# Patient Record
Sex: Male | Born: 1945
Health system: Southern US, Community
[De-identification: ages and names within clinical notes are randomized; demographics above are authoritative.]

## PROBLEM LIST (undated history)

## (undated) DIAGNOSIS — M1990 Unspecified osteoarthritis, unspecified site: Secondary | ICD-10-CM

## (undated) DIAGNOSIS — K219 Gastro-esophageal reflux disease without esophagitis: Secondary | ICD-10-CM

## (undated) DIAGNOSIS — G4730 Sleep apnea, unspecified: Secondary | ICD-10-CM

## (undated) DIAGNOSIS — Z8719 Personal history of other diseases of the digestive system: Secondary | ICD-10-CM

## (undated) DIAGNOSIS — R51 Headache: Secondary | ICD-10-CM

## (undated) DIAGNOSIS — E7800 Pure hypercholesterolemia, unspecified: Secondary | ICD-10-CM

## (undated) DIAGNOSIS — G709 Myoneural disorder, unspecified: Secondary | ICD-10-CM

## (undated) DIAGNOSIS — I10 Essential (primary) hypertension: Secondary | ICD-10-CM

## (undated) DIAGNOSIS — C801 Malignant (primary) neoplasm, unspecified: Secondary | ICD-10-CM

## (undated) HISTORY — PX: COLONOSCOPY W/ BIOPSIES AND POLYPECTOMY: SHX1376

---

## 1998-12-24 ENCOUNTER — Emergency Department (HOSPITAL_COMMUNITY): Admission: EM | Admit: 1998-12-24 | Discharge: 1998-12-24 | Payer: Self-pay | Admitting: Emergency Medicine

## 2003-06-25 ENCOUNTER — Emergency Department (HOSPITAL_COMMUNITY): Admission: EM | Admit: 2003-06-25 | Discharge: 2003-06-25 | Payer: Self-pay | Admitting: Emergency Medicine

## 2008-08-14 ENCOUNTER — Emergency Department (HOSPITAL_COMMUNITY): Admission: EM | Admit: 2008-08-14 | Discharge: 2008-08-14 | Payer: Self-pay | Admitting: Emergency Medicine

## 2010-07-22 ENCOUNTER — Ambulatory Visit: Admission: RE | Admit: 2010-07-22 | Discharge: 2010-07-28 | Payer: Self-pay | Admitting: Radiation Oncology

## 2010-11-02 DIAGNOSIS — C801 Malignant (primary) neoplasm, unspecified: Secondary | ICD-10-CM

## 2010-11-02 HISTORY — DX: Malignant (primary) neoplasm, unspecified: C80.1

## 2010-11-27 HISTORY — PX: PROSTATE SURGERY: SHX751

## 2011-02-12 ENCOUNTER — Telehealth: Payer: Self-pay | Admitting: Cardiovascular Disease

## 2011-02-12 NOTE — Telephone Encounter (Signed)
Wife called and told if he has anymore episodes of chest pain to go to er, Pt/wife verbalized understanding. Alfonso Ramus RN

## 2011-02-12 NOTE — Telephone Encounter (Signed)
New patient scheduled on May 1, has family history of heart attacks, patient having discomfort and waking up during night gasping. Patient has prostate cancer. Wife stated that she and patient both have had premonition of him having a heart attack.  no chart

## 2011-03-03 ENCOUNTER — Institutional Professional Consult (permissible substitution): Payer: Self-pay | Admitting: Cardiovascular Disease

## 2011-08-04 LAB — DIFFERENTIAL
Basophils Absolute: 0
Basophils Relative: 0
Eosinophils Absolute: 0.1
Eosinophils Relative: 2
Lymphocytes Relative: 13
Lymphs Abs: 0.9
Monocytes Absolute: 0.3
Monocytes Relative: 5
Neutro Abs: 5.2
Neutrophils Relative %: 79 — ABNORMAL HIGH

## 2011-08-04 LAB — CBC
HCT: 41.9
Hemoglobin: 13.8
MCHC: 33
MCV: 82.9
Platelets: 195
RBC: 5.05
RDW: 13
WBC: 6.4

## 2011-08-04 LAB — POCT I-STAT, CHEM 8
BUN: 7
Calcium, Ion: 1.15
Chloride: 108
Creatinine, Ser: 1.1
Glucose, Bld: 100 — ABNORMAL HIGH
HCT: 41
Hemoglobin: 13.9
Potassium: 4.3
Sodium: 139
TCO2: 23

## 2011-08-04 LAB — POCT CARDIAC MARKERS
CKMB, poc: 1.9
Myoglobin, poc: 288
Troponin i, poc: <0.05

## 2011-12-07 DIAGNOSIS — L821 Other seborrheic keratosis: Secondary | ICD-10-CM | POA: Diagnosis not present

## 2011-12-07 DIAGNOSIS — L819 Disorder of pigmentation, unspecified: Secondary | ICD-10-CM | POA: Diagnosis not present

## 2011-12-07 DIAGNOSIS — D485 Neoplasm of uncertain behavior of skin: Secondary | ICD-10-CM | POA: Diagnosis not present

## 2011-12-07 DIAGNOSIS — L739 Follicular disorder, unspecified: Secondary | ICD-10-CM | POA: Diagnosis not present

## 2011-12-07 DIAGNOSIS — D239 Other benign neoplasm of skin, unspecified: Secondary | ICD-10-CM | POA: Diagnosis not present

## 2011-12-29 DIAGNOSIS — N529 Male erectile dysfunction, unspecified: Secondary | ICD-10-CM | POA: Diagnosis not present

## 2011-12-29 DIAGNOSIS — C61 Malignant neoplasm of prostate: Secondary | ICD-10-CM | POA: Diagnosis not present

## 2012-02-18 DIAGNOSIS — L723 Sebaceous cyst: Secondary | ICD-10-CM | POA: Diagnosis not present

## 2012-05-01 DIAGNOSIS — S91309A Unspecified open wound, unspecified foot, initial encounter: Secondary | ICD-10-CM | POA: Diagnosis not present

## 2012-05-01 DIAGNOSIS — W278XXA Contact with other nonpowered hand tool, initial encounter: Secondary | ICD-10-CM | POA: Diagnosis not present

## 2012-06-27 DIAGNOSIS — N529 Male erectile dysfunction, unspecified: Secondary | ICD-10-CM | POA: Diagnosis not present

## 2012-06-27 DIAGNOSIS — C61 Malignant neoplasm of prostate: Secondary | ICD-10-CM | POA: Diagnosis not present

## 2012-09-28 DIAGNOSIS — R51 Headache: Secondary | ICD-10-CM | POA: Diagnosis not present

## 2012-09-28 DIAGNOSIS — M25539 Pain in unspecified wrist: Secondary | ICD-10-CM | POA: Diagnosis not present

## 2012-09-28 DIAGNOSIS — M25529 Pain in unspecified elbow: Secondary | ICD-10-CM | POA: Diagnosis not present

## 2012-10-03 DIAGNOSIS — M519 Unspecified thoracic, thoracolumbar and lumbosacral intervertebral disc disorder: Secondary | ICD-10-CM | POA: Diagnosis not present

## 2012-10-03 DIAGNOSIS — M25529 Pain in unspecified elbow: Secondary | ICD-10-CM | POA: Diagnosis not present

## 2012-10-03 DIAGNOSIS — M542 Cervicalgia: Secondary | ICD-10-CM | POA: Diagnosis not present

## 2012-10-07 DIAGNOSIS — M5030 Other cervical disc degeneration, unspecified cervical region: Secondary | ICD-10-CM | POA: Diagnosis not present

## 2012-10-10 DIAGNOSIS — N529 Male erectile dysfunction, unspecified: Secondary | ICD-10-CM | POA: Diagnosis not present

## 2012-10-10 DIAGNOSIS — C61 Malignant neoplasm of prostate: Secondary | ICD-10-CM | POA: Diagnosis not present

## 2012-10-18 DIAGNOSIS — G44309 Post-traumatic headache, unspecified, not intractable: Secondary | ICD-10-CM | POA: Diagnosis not present

## 2012-10-18 DIAGNOSIS — F0781 Postconcussional syndrome: Secondary | ICD-10-CM | POA: Diagnosis not present

## 2012-10-18 DIAGNOSIS — F29 Unspecified psychosis not due to a substance or known physiological condition: Secondary | ICD-10-CM | POA: Diagnosis not present

## 2012-10-18 DIAGNOSIS — M5412 Radiculopathy, cervical region: Secondary | ICD-10-CM | POA: Diagnosis not present

## 2012-10-18 DIAGNOSIS — IMO0001 Reserved for inherently not codable concepts without codable children: Secondary | ICD-10-CM | POA: Diagnosis not present

## 2012-10-18 DIAGNOSIS — S0990XA Unspecified injury of head, initial encounter: Secondary | ICD-10-CM | POA: Diagnosis not present

## 2012-10-18 DIAGNOSIS — S069X9A Unspecified intracranial injury with loss of consciousness of unspecified duration, initial encounter: Secondary | ICD-10-CM | POA: Diagnosis not present

## 2012-10-18 DIAGNOSIS — R51 Headache: Secondary | ICD-10-CM | POA: Diagnosis not present

## 2012-10-21 DIAGNOSIS — G5620 Lesion of ulnar nerve, unspecified upper limb: Secondary | ICD-10-CM | POA: Diagnosis not present

## 2012-10-21 DIAGNOSIS — M25529 Pain in unspecified elbow: Secondary | ICD-10-CM | POA: Diagnosis not present

## 2012-10-25 DIAGNOSIS — R209 Unspecified disturbances of skin sensation: Secondary | ICD-10-CM | POA: Diagnosis not present

## 2012-11-02 HISTORY — PX: ULNAR NERVE REPAIR: SHX2594

## 2012-11-03 DIAGNOSIS — G5620 Lesion of ulnar nerve, unspecified upper limb: Secondary | ICD-10-CM | POA: Diagnosis not present

## 2012-11-03 DIAGNOSIS — M25529 Pain in unspecified elbow: Secondary | ICD-10-CM | POA: Diagnosis not present

## 2012-11-10 DIAGNOSIS — G5620 Lesion of ulnar nerve, unspecified upper limb: Secondary | ICD-10-CM | POA: Diagnosis not present

## 2012-11-10 DIAGNOSIS — M25529 Pain in unspecified elbow: Secondary | ICD-10-CM | POA: Diagnosis not present

## 2012-11-15 DIAGNOSIS — M25529 Pain in unspecified elbow: Secondary | ICD-10-CM | POA: Diagnosis not present

## 2012-11-15 DIAGNOSIS — G5620 Lesion of ulnar nerve, unspecified upper limb: Secondary | ICD-10-CM | POA: Diagnosis not present

## 2012-11-18 DIAGNOSIS — G5620 Lesion of ulnar nerve, unspecified upper limb: Secondary | ICD-10-CM | POA: Diagnosis not present

## 2012-11-18 DIAGNOSIS — M25529 Pain in unspecified elbow: Secondary | ICD-10-CM | POA: Diagnosis not present

## 2012-11-22 DIAGNOSIS — G5620 Lesion of ulnar nerve, unspecified upper limb: Secondary | ICD-10-CM | POA: Diagnosis not present

## 2012-11-22 DIAGNOSIS — M25529 Pain in unspecified elbow: Secondary | ICD-10-CM | POA: Diagnosis not present

## 2012-11-25 DIAGNOSIS — M25529 Pain in unspecified elbow: Secondary | ICD-10-CM | POA: Diagnosis not present

## 2012-11-25 DIAGNOSIS — G5620 Lesion of ulnar nerve, unspecified upper limb: Secondary | ICD-10-CM | POA: Diagnosis not present

## 2012-11-29 DIAGNOSIS — G5620 Lesion of ulnar nerve, unspecified upper limb: Secondary | ICD-10-CM | POA: Diagnosis not present

## 2012-11-29 DIAGNOSIS — M25529 Pain in unspecified elbow: Secondary | ICD-10-CM | POA: Diagnosis not present

## 2012-12-02 DIAGNOSIS — M25529 Pain in unspecified elbow: Secondary | ICD-10-CM | POA: Diagnosis not present

## 2012-12-02 DIAGNOSIS — G5620 Lesion of ulnar nerve, unspecified upper limb: Secondary | ICD-10-CM | POA: Diagnosis not present

## 2012-12-06 DIAGNOSIS — G5620 Lesion of ulnar nerve, unspecified upper limb: Secondary | ICD-10-CM | POA: Diagnosis not present

## 2012-12-06 DIAGNOSIS — M25529 Pain in unspecified elbow: Secondary | ICD-10-CM | POA: Diagnosis not present

## 2012-12-08 DIAGNOSIS — Z1211 Encounter for screening for malignant neoplasm of colon: Secondary | ICD-10-CM | POA: Diagnosis not present

## 2012-12-08 DIAGNOSIS — Z13220 Encounter for screening for lipoid disorders: Secondary | ICD-10-CM | POA: Diagnosis not present

## 2012-12-08 DIAGNOSIS — Z0000 Encounter for general adult medical examination without abnormal findings: Secondary | ICD-10-CM | POA: Diagnosis not present

## 2012-12-08 DIAGNOSIS — Z131 Encounter for screening for diabetes mellitus: Secondary | ICD-10-CM | POA: Diagnosis not present

## 2012-12-08 DIAGNOSIS — E782 Mixed hyperlipidemia: Secondary | ICD-10-CM | POA: Diagnosis not present

## 2012-12-09 DIAGNOSIS — M25529 Pain in unspecified elbow: Secondary | ICD-10-CM | POA: Diagnosis not present

## 2012-12-09 DIAGNOSIS — G5620 Lesion of ulnar nerve, unspecified upper limb: Secondary | ICD-10-CM | POA: Diagnosis not present

## 2012-12-13 DIAGNOSIS — M25529 Pain in unspecified elbow: Secondary | ICD-10-CM | POA: Diagnosis not present

## 2012-12-13 DIAGNOSIS — G5620 Lesion of ulnar nerve, unspecified upper limb: Secondary | ICD-10-CM | POA: Diagnosis not present

## 2012-12-20 DIAGNOSIS — G5620 Lesion of ulnar nerve, unspecified upper limb: Secondary | ICD-10-CM | POA: Diagnosis not present

## 2012-12-21 DIAGNOSIS — M25529 Pain in unspecified elbow: Secondary | ICD-10-CM | POA: Diagnosis not present

## 2012-12-21 DIAGNOSIS — G5620 Lesion of ulnar nerve, unspecified upper limb: Secondary | ICD-10-CM | POA: Diagnosis not present

## 2012-12-23 DIAGNOSIS — Z049 Encounter for examination and observation for unspecified reason: Secondary | ICD-10-CM | POA: Diagnosis not present

## 2012-12-23 DIAGNOSIS — R51 Headache: Secondary | ICD-10-CM | POA: Diagnosis not present

## 2012-12-28 DIAGNOSIS — N529 Male erectile dysfunction, unspecified: Secondary | ICD-10-CM | POA: Diagnosis not present

## 2012-12-28 DIAGNOSIS — G5620 Lesion of ulnar nerve, unspecified upper limb: Secondary | ICD-10-CM | POA: Diagnosis not present

## 2012-12-28 DIAGNOSIS — M25529 Pain in unspecified elbow: Secondary | ICD-10-CM | POA: Diagnosis not present

## 2012-12-28 DIAGNOSIS — C61 Malignant neoplasm of prostate: Secondary | ICD-10-CM | POA: Diagnosis not present

## 2012-12-29 DIAGNOSIS — E782 Mixed hyperlipidemia: Secondary | ICD-10-CM | POA: Diagnosis not present

## 2012-12-29 DIAGNOSIS — R799 Abnormal finding of blood chemistry, unspecified: Secondary | ICD-10-CM | POA: Diagnosis not present

## 2012-12-29 DIAGNOSIS — E559 Vitamin D deficiency, unspecified: Secondary | ICD-10-CM | POA: Diagnosis not present

## 2012-12-29 DIAGNOSIS — R7301 Impaired fasting glucose: Secondary | ICD-10-CM | POA: Diagnosis not present

## 2013-01-03 DIAGNOSIS — G5620 Lesion of ulnar nerve, unspecified upper limb: Secondary | ICD-10-CM | POA: Diagnosis not present

## 2013-01-03 DIAGNOSIS — M25529 Pain in unspecified elbow: Secondary | ICD-10-CM | POA: Diagnosis not present

## 2013-01-06 DIAGNOSIS — G5620 Lesion of ulnar nerve, unspecified upper limb: Secondary | ICD-10-CM | POA: Diagnosis not present

## 2013-01-06 DIAGNOSIS — M25529 Pain in unspecified elbow: Secondary | ICD-10-CM | POA: Diagnosis not present

## 2013-01-10 DIAGNOSIS — G5620 Lesion of ulnar nerve, unspecified upper limb: Secondary | ICD-10-CM | POA: Diagnosis not present

## 2013-01-10 DIAGNOSIS — M25529 Pain in unspecified elbow: Secondary | ICD-10-CM | POA: Diagnosis not present

## 2013-01-12 DIAGNOSIS — G5620 Lesion of ulnar nerve, unspecified upper limb: Secondary | ICD-10-CM | POA: Diagnosis not present

## 2013-01-12 DIAGNOSIS — M25529 Pain in unspecified elbow: Secondary | ICD-10-CM | POA: Diagnosis not present

## 2013-01-17 DIAGNOSIS — M25529 Pain in unspecified elbow: Secondary | ICD-10-CM | POA: Diagnosis not present

## 2013-01-17 DIAGNOSIS — G5620 Lesion of ulnar nerve, unspecified upper limb: Secondary | ICD-10-CM | POA: Diagnosis not present

## 2013-01-19 DIAGNOSIS — M25529 Pain in unspecified elbow: Secondary | ICD-10-CM | POA: Diagnosis not present

## 2013-01-19 DIAGNOSIS — G5620 Lesion of ulnar nerve, unspecified upper limb: Secondary | ICD-10-CM | POA: Diagnosis not present

## 2013-01-24 DIAGNOSIS — G5620 Lesion of ulnar nerve, unspecified upper limb: Secondary | ICD-10-CM | POA: Diagnosis not present

## 2013-01-24 DIAGNOSIS — M25529 Pain in unspecified elbow: Secondary | ICD-10-CM | POA: Diagnosis not present

## 2013-01-27 DIAGNOSIS — G5620 Lesion of ulnar nerve, unspecified upper limb: Secondary | ICD-10-CM | POA: Diagnosis not present

## 2013-01-27 DIAGNOSIS — M25529 Pain in unspecified elbow: Secondary | ICD-10-CM | POA: Diagnosis not present

## 2013-01-31 DIAGNOSIS — G5620 Lesion of ulnar nerve, unspecified upper limb: Secondary | ICD-10-CM | POA: Diagnosis not present

## 2013-01-31 DIAGNOSIS — M25529 Pain in unspecified elbow: Secondary | ICD-10-CM | POA: Diagnosis not present

## 2013-02-03 DIAGNOSIS — G5620 Lesion of ulnar nerve, unspecified upper limb: Secondary | ICD-10-CM | POA: Diagnosis not present

## 2013-02-03 DIAGNOSIS — M25529 Pain in unspecified elbow: Secondary | ICD-10-CM | POA: Diagnosis not present

## 2013-02-07 DIAGNOSIS — G5620 Lesion of ulnar nerve, unspecified upper limb: Secondary | ICD-10-CM | POA: Diagnosis not present

## 2013-02-07 DIAGNOSIS — M25529 Pain in unspecified elbow: Secondary | ICD-10-CM | POA: Diagnosis not present

## 2013-02-10 DIAGNOSIS — M25529 Pain in unspecified elbow: Secondary | ICD-10-CM | POA: Diagnosis not present

## 2013-02-10 DIAGNOSIS — G5620 Lesion of ulnar nerve, unspecified upper limb: Secondary | ICD-10-CM | POA: Diagnosis not present

## 2013-02-14 DIAGNOSIS — M25529 Pain in unspecified elbow: Secondary | ICD-10-CM | POA: Diagnosis not present

## 2013-02-14 DIAGNOSIS — G5620 Lesion of ulnar nerve, unspecified upper limb: Secondary | ICD-10-CM | POA: Diagnosis not present

## 2013-02-16 DIAGNOSIS — G5620 Lesion of ulnar nerve, unspecified upper limb: Secondary | ICD-10-CM | POA: Diagnosis not present

## 2013-02-16 DIAGNOSIS — M25529 Pain in unspecified elbow: Secondary | ICD-10-CM | POA: Diagnosis not present

## 2013-02-21 DIAGNOSIS — R51 Headache: Secondary | ICD-10-CM | POA: Diagnosis not present

## 2013-03-23 DIAGNOSIS — G5620 Lesion of ulnar nerve, unspecified upper limb: Secondary | ICD-10-CM | POA: Diagnosis not present

## 2013-03-23 DIAGNOSIS — M25529 Pain in unspecified elbow: Secondary | ICD-10-CM | POA: Diagnosis not present

## 2013-03-28 DIAGNOSIS — Z13220 Encounter for screening for lipoid disorders: Secondary | ICD-10-CM | POA: Diagnosis not present

## 2013-03-28 DIAGNOSIS — E782 Mixed hyperlipidemia: Secondary | ICD-10-CM | POA: Diagnosis not present

## 2013-04-18 DIAGNOSIS — Z1331 Encounter for screening for depression: Secondary | ICD-10-CM | POA: Diagnosis not present

## 2013-04-18 DIAGNOSIS — E782 Mixed hyperlipidemia: Secondary | ICD-10-CM | POA: Diagnosis not present

## 2013-04-18 DIAGNOSIS — E559 Vitamin D deficiency, unspecified: Secondary | ICD-10-CM | POA: Diagnosis not present

## 2013-04-18 DIAGNOSIS — R7301 Impaired fasting glucose: Secondary | ICD-10-CM | POA: Diagnosis not present

## 2013-05-22 DIAGNOSIS — M25529 Pain in unspecified elbow: Secondary | ICD-10-CM | POA: Diagnosis not present

## 2013-05-22 DIAGNOSIS — G5620 Lesion of ulnar nerve, unspecified upper limb: Secondary | ICD-10-CM | POA: Diagnosis not present

## 2013-05-23 DIAGNOSIS — R51 Headache: Secondary | ICD-10-CM | POA: Diagnosis not present

## 2013-06-02 DIAGNOSIS — G5620 Lesion of ulnar nerve, unspecified upper limb: Secondary | ICD-10-CM | POA: Diagnosis not present

## 2013-07-06 DIAGNOSIS — C61 Malignant neoplasm of prostate: Secondary | ICD-10-CM | POA: Diagnosis not present

## 2013-07-06 DIAGNOSIS — N529 Male erectile dysfunction, unspecified: Secondary | ICD-10-CM | POA: Diagnosis not present

## 2013-09-06 DIAGNOSIS — F341 Dysthymic disorder: Secondary | ICD-10-CM | POA: Diagnosis not present

## 2013-09-06 DIAGNOSIS — Z1389 Encounter for screening for other disorder: Secondary | ICD-10-CM | POA: Diagnosis not present

## 2013-09-06 DIAGNOSIS — Z6831 Body mass index (BMI) 31.0-31.9, adult: Secondary | ICD-10-CM | POA: Diagnosis not present

## 2013-09-12 DIAGNOSIS — H52229 Regular astigmatism, unspecified eye: Secondary | ICD-10-CM | POA: Diagnosis not present

## 2013-09-12 DIAGNOSIS — H11159 Pinguecula, unspecified eye: Secondary | ICD-10-CM | POA: Diagnosis not present

## 2013-09-12 DIAGNOSIS — H5200 Hypermetropia, unspecified eye: Secondary | ICD-10-CM | POA: Diagnosis not present

## 2013-09-12 DIAGNOSIS — H259 Unspecified age-related cataract: Secondary | ICD-10-CM | POA: Diagnosis not present

## 2013-10-06 DIAGNOSIS — F341 Dysthymic disorder: Secondary | ICD-10-CM | POA: Diagnosis not present

## 2013-10-06 DIAGNOSIS — Z6831 Body mass index (BMI) 31.0-31.9, adult: Secondary | ICD-10-CM | POA: Diagnosis not present

## 2013-10-06 DIAGNOSIS — Z1389 Encounter for screening for other disorder: Secondary | ICD-10-CM | POA: Diagnosis not present

## 2013-10-06 DIAGNOSIS — G4700 Insomnia, unspecified: Secondary | ICD-10-CM | POA: Diagnosis not present

## 2013-10-10 DIAGNOSIS — R413 Other amnesia: Secondary | ICD-10-CM | POA: Diagnosis not present

## 2013-10-10 DIAGNOSIS — F09 Unspecified mental disorder due to known physiological condition: Secondary | ICD-10-CM | POA: Diagnosis not present

## 2013-10-10 DIAGNOSIS — M542 Cervicalgia: Secondary | ICD-10-CM | POA: Diagnosis not present

## 2013-10-10 DIAGNOSIS — R51 Headache: Secondary | ICD-10-CM | POA: Diagnosis not present

## 2013-10-11 DIAGNOSIS — R7301 Impaired fasting glucose: Secondary | ICD-10-CM | POA: Diagnosis not present

## 2013-10-11 DIAGNOSIS — E782 Mixed hyperlipidemia: Secondary | ICD-10-CM | POA: Diagnosis not present

## 2013-10-16 DIAGNOSIS — R7301 Impaired fasting glucose: Secondary | ICD-10-CM | POA: Diagnosis not present

## 2013-10-16 DIAGNOSIS — E782 Mixed hyperlipidemia: Secondary | ICD-10-CM | POA: Diagnosis not present

## 2013-10-19 DIAGNOSIS — I6789 Other cerebrovascular disease: Secondary | ICD-10-CM | POA: Diagnosis not present

## 2013-10-19 DIAGNOSIS — R413 Other amnesia: Secondary | ICD-10-CM | POA: Diagnosis not present

## 2013-10-19 DIAGNOSIS — F22 Delusional disorders: Secondary | ICD-10-CM | POA: Diagnosis not present

## 2013-10-19 DIAGNOSIS — F09 Unspecified mental disorder due to known physiological condition: Secondary | ICD-10-CM | POA: Diagnosis not present

## 2013-10-19 DIAGNOSIS — R51 Headache: Secondary | ICD-10-CM | POA: Diagnosis not present

## 2013-11-23 DIAGNOSIS — R413 Other amnesia: Secondary | ICD-10-CM | POA: Diagnosis not present

## 2013-11-28 DIAGNOSIS — M5412 Radiculopathy, cervical region: Secondary | ICD-10-CM | POA: Diagnosis not present

## 2013-11-28 DIAGNOSIS — M4802 Spinal stenosis, cervical region: Secondary | ICD-10-CM | POA: Diagnosis not present

## 2013-11-28 DIAGNOSIS — M47812 Spondylosis without myelopathy or radiculopathy, cervical region: Secondary | ICD-10-CM | POA: Diagnosis not present

## 2013-11-29 DIAGNOSIS — M47812 Spondylosis without myelopathy or radiculopathy, cervical region: Secondary | ICD-10-CM | POA: Diagnosis not present

## 2013-11-29 DIAGNOSIS — M5412 Radiculopathy, cervical region: Secondary | ICD-10-CM | POA: Diagnosis not present

## 2013-12-11 DIAGNOSIS — R413 Other amnesia: Secondary | ICD-10-CM | POA: Diagnosis not present

## 2013-12-13 DIAGNOSIS — R110 Nausea: Secondary | ICD-10-CM | POA: Diagnosis not present

## 2013-12-13 DIAGNOSIS — G4700 Insomnia, unspecified: Secondary | ICD-10-CM | POA: Diagnosis not present

## 2013-12-13 DIAGNOSIS — Z6831 Body mass index (BMI) 31.0-31.9, adult: Secondary | ICD-10-CM | POA: Diagnosis not present

## 2013-12-13 DIAGNOSIS — F341 Dysthymic disorder: Secondary | ICD-10-CM | POA: Diagnosis not present

## 2013-12-20 DIAGNOSIS — R5383 Other fatigue: Secondary | ICD-10-CM | POA: Diagnosis not present

## 2013-12-20 DIAGNOSIS — R5381 Other malaise: Secondary | ICD-10-CM | POA: Diagnosis not present

## 2013-12-20 DIAGNOSIS — Z6831 Body mass index (BMI) 31.0-31.9, adult: Secondary | ICD-10-CM | POA: Diagnosis not present

## 2013-12-20 DIAGNOSIS — R110 Nausea: Secondary | ICD-10-CM | POA: Diagnosis not present

## 2013-12-21 DIAGNOSIS — M542 Cervicalgia: Secondary | ICD-10-CM | POA: Diagnosis not present

## 2013-12-21 DIAGNOSIS — M5412 Radiculopathy, cervical region: Secondary | ICD-10-CM | POA: Diagnosis not present

## 2013-12-21 DIAGNOSIS — E663 Overweight: Secondary | ICD-10-CM | POA: Diagnosis not present

## 2014-01-02 DIAGNOSIS — R1013 Epigastric pain: Secondary | ICD-10-CM | POA: Diagnosis not present

## 2014-01-02 DIAGNOSIS — R52 Pain, unspecified: Secondary | ICD-10-CM | POA: Diagnosis not present

## 2014-01-02 DIAGNOSIS — R748 Abnormal levels of other serum enzymes: Secondary | ICD-10-CM | POA: Diagnosis not present

## 2014-01-02 DIAGNOSIS — G4700 Insomnia, unspecified: Secondary | ICD-10-CM | POA: Diagnosis not present

## 2014-01-05 DIAGNOSIS — K449 Diaphragmatic hernia without obstruction or gangrene: Secondary | ICD-10-CM | POA: Diagnosis not present

## 2014-01-05 DIAGNOSIS — R1013 Epigastric pain: Secondary | ICD-10-CM | POA: Diagnosis not present

## 2014-01-05 DIAGNOSIS — R1033 Periumbilical pain: Secondary | ICD-10-CM | POA: Diagnosis not present

## 2014-01-05 DIAGNOSIS — R748 Abnormal levels of other serum enzymes: Secondary | ICD-10-CM | POA: Diagnosis not present

## 2014-01-05 DIAGNOSIS — R911 Solitary pulmonary nodule: Secondary | ICD-10-CM | POA: Diagnosis not present

## 2014-01-09 DIAGNOSIS — R5383 Other fatigue: Secondary | ICD-10-CM | POA: Diagnosis not present

## 2014-01-09 DIAGNOSIS — R5381 Other malaise: Secondary | ICD-10-CM | POA: Diagnosis not present

## 2014-01-09 DIAGNOSIS — R110 Nausea: Secondary | ICD-10-CM | POA: Diagnosis not present

## 2014-01-09 DIAGNOSIS — Z8546 Personal history of malignant neoplasm of prostate: Secondary | ICD-10-CM | POA: Diagnosis not present

## 2014-01-09 DIAGNOSIS — R1084 Generalized abdominal pain: Secondary | ICD-10-CM | POA: Diagnosis not present

## 2014-01-11 DIAGNOSIS — K5900 Constipation, unspecified: Secondary | ICD-10-CM | POA: Diagnosis not present

## 2014-01-11 DIAGNOSIS — Z1211 Encounter for screening for malignant neoplasm of colon: Secondary | ICD-10-CM | POA: Diagnosis not present

## 2014-01-11 DIAGNOSIS — R110 Nausea: Secondary | ICD-10-CM | POA: Diagnosis not present

## 2014-01-12 DIAGNOSIS — D1330 Benign neoplasm of unspecified part of small intestine: Secondary | ICD-10-CM | POA: Diagnosis not present

## 2014-01-12 DIAGNOSIS — Z8546 Personal history of malignant neoplasm of prostate: Secondary | ICD-10-CM | POA: Diagnosis not present

## 2014-01-12 DIAGNOSIS — D130 Benign neoplasm of esophagus: Secondary | ICD-10-CM | POA: Diagnosis not present

## 2014-01-12 DIAGNOSIS — R110 Nausea: Secondary | ICD-10-CM | POA: Diagnosis not present

## 2014-01-12 DIAGNOSIS — K2970 Gastritis, unspecified, without bleeding: Secondary | ICD-10-CM | POA: Diagnosis not present

## 2014-01-12 DIAGNOSIS — R1013 Epigastric pain: Secondary | ICD-10-CM | POA: Diagnosis not present

## 2014-01-12 DIAGNOSIS — Z9889 Other specified postprocedural states: Secondary | ICD-10-CM | POA: Diagnosis not present

## 2014-01-12 DIAGNOSIS — D649 Anemia, unspecified: Secondary | ICD-10-CM | POA: Diagnosis not present

## 2014-01-12 DIAGNOSIS — Z7982 Long term (current) use of aspirin: Secondary | ICD-10-CM | POA: Diagnosis not present

## 2014-01-12 DIAGNOSIS — Z79899 Other long term (current) drug therapy: Secondary | ICD-10-CM | POA: Diagnosis not present

## 2014-01-12 DIAGNOSIS — Z8619 Personal history of other infectious and parasitic diseases: Secondary | ICD-10-CM | POA: Diagnosis not present

## 2014-01-23 ENCOUNTER — Other Ambulatory Visit: Payer: Self-pay | Admitting: Neurosurgery

## 2014-01-25 DIAGNOSIS — N529 Male erectile dysfunction, unspecified: Secondary | ICD-10-CM | POA: Diagnosis not present

## 2014-01-25 DIAGNOSIS — C61 Malignant neoplasm of prostate: Secondary | ICD-10-CM | POA: Diagnosis not present

## 2014-01-29 ENCOUNTER — Encounter (HOSPITAL_COMMUNITY): Payer: Self-pay | Admitting: Pharmacy Technician

## 2014-01-29 NOTE — Pre-Procedure Instructions (Signed)
Caleb Woodward  01/29/2014   Your procedure is scheduled on:  Tuesday April 7 th at 0900 AM  Report to [facility] "[facility]" at 0600 AM.  Call this number if you have problems the morning of surgery: [phone]   Remember:   Do not eat food or drink liquids after midnight Monday   Take these medicines the morning of surgery with A SIP OF WATER:Citalopram (Celexa) , and Lansoprazole (Prevacid)  Discontinue taking Aspirin Herbal medication, NSAIDs, and Vitamins 7 days prior to surgery.  Do not wear jewelry, make-up or nail polish.  Do not wear lotions, powders, or perfumes. You may wear deodorant.  Do not shave 48 hours prior to surgery. Men may shave face and neck.  Do not bring valuables to the hospital.  [facility] is not responsible for any belongings or valuables.               Contacts, dentures or bridgework may not be worn into surgery.  Leave suitcase in the car. After surgery it may be brought to your room.  For patients admitted to the hospital, discharge time is determined by your  treatment team.               Patients discharged the day of surgery will not be allowed to drive home.    Special Instructions: [place] - Preparing for Surgery  Before surgery, you can play an important role.  Because skin is not sterile, your skin needs to be as free of germs as possible.  You can reduce the number of germs on you skin by washing with CHG (chlorahexidine gluconate) soap before surgery.  CHG is an antiseptic cleaner which kills germs and bonds with the skin to continue killing germs even after washing.  Please DO NOT use if you have an allergy to CHG or antibacterial soaps.  If your skin becomes reddened/irritated stop using the CHG and inform your nurse when you arrive at Short Stay.  Do not shave (including legs and underarms) for at least 48 hours prior to the first CHG shower.  You may shave your face.  Please follow these instructions carefully:   1.  Shower  with CHG Soap the night before surgery and the                                morning of Surgery.  2.  If you choose to wash your hair, wash your hair first as usual with your       normal shampoo.  3.  After you shampoo, rinse your hair and body thoroughly to remove the                      Shampoo.  4.  Use CHG as you would any other liquid soap.  You can apply chg directly       to the skin and wash gently with scrungie or a clean washcloth.  5.  Apply the CHG Soap to your body ONLY FROM THE NECK DOWN.        Do not use on open wounds or open sores.  Avoid contact with your eyes,       ears, mouth and genitals (private parts).  Wash genitals (private parts)       with your normal soap.  6.  Wash thoroughly, paying special attention to the area where your surgery  will be performed.  7.  Thoroughly rinse your body with warm water from the neck down.  8.  DO NOT shower/wash with your normal soap after using and rinsing off       the CHG Soap.  9.  Pat yourself dry with a clean towel.            10.  Wear clean pajamas.            11.  Place clean sheets on your bed the night of your first shower and do not        sleep with pets.  Day of Surgery  Do not apply any lotions/deoderants the morning of surgery.  Please wear clean clothes to the hospital/surgery center.      Please read over the following fact sheets that you were given: Pain Booklet, Coughing and Deep Breathing, MRSA Information and Surgical Site Infection Prevention

## 2014-01-30 ENCOUNTER — Encounter (HOSPITAL_COMMUNITY): Payer: Self-pay

## 2014-01-30 ENCOUNTER — Encounter (HOSPITAL_COMMUNITY)
Admission: RE | Admit: 2014-01-30 | Discharge: 2014-01-30 | Disposition: A | Discharge status: Home / Self Care | Payer: Medicare Other | Source: Ambulatory Visit | Attending: Anesthesiology | Admitting: Anesthesiology

## 2014-01-30 ENCOUNTER — Encounter (HOSPITAL_COMMUNITY)
Admission: RE | Admit: 2014-01-30 | Discharge: 2014-01-30 | Disposition: A | Discharge status: Home / Self Care | Payer: Medicare Other | Source: Ambulatory Visit | Attending: Neurosurgery | Admitting: Neurosurgery

## 2014-01-30 DIAGNOSIS — R079 Chest pain, unspecified: Secondary | ICD-10-CM | POA: Diagnosis not present

## 2014-01-30 DIAGNOSIS — Z01810 Encounter for preprocedural cardiovascular examination: Secondary | ICD-10-CM | POA: Diagnosis not present

## 2014-01-30 DIAGNOSIS — Z01812 Encounter for preprocedural laboratory examination: Secondary | ICD-10-CM | POA: Insufficient documentation

## 2014-01-30 DIAGNOSIS — Z01818 Encounter for other preprocedural examination: Secondary | ICD-10-CM | POA: Diagnosis not present

## 2014-01-30 HISTORY — DX: Myoneural disorder, unspecified: G70.9

## 2014-01-30 HISTORY — DX: Personal history of other diseases of the digestive system: Z87.19

## 2014-01-30 HISTORY — DX: Headache: R51

## 2014-01-30 HISTORY — DX: Malignant (primary) neoplasm, unspecified: C80.1

## 2014-01-30 HISTORY — DX: Pure hypercholesterolemia, unspecified: E78.00

## 2014-01-30 HISTORY — DX: Gastro-esophageal reflux disease without esophagitis: K21.9

## 2014-01-30 LAB — BASIC METABOLIC PANEL
BUN: 17 mg/dL (ref 6–23)
CO2: 27 mEq/L (ref 19–32)
Calcium: 9.5 mg/dL (ref 8.4–10.5)
Chloride: 104 mEq/L (ref 96–112)
Creatinine, Ser: 1.04 mg/dL (ref 0.50–1.35)
GFR calc Af Amer: 83 mL/min — ABNORMAL LOW (ref >90)
GFR calc non Af Amer: 72 mL/min — ABNORMAL LOW (ref >90)
Glucose, Bld: 101 mg/dL — ABNORMAL HIGH (ref 70–99)
Potassium: 4.4 mEq/L (ref 3.7–5.3)
Sodium: 142 mEq/L (ref 137–147)

## 2014-01-30 LAB — CBC
HCT: 40.7 % (ref 39.0–52.0)
Hemoglobin: 14 g/dL (ref 13.0–17.0)
MCH: 27.8 pg (ref 26.0–34.0)
MCHC: 34.4 g/dL (ref 30.0–36.0)
MCV: 80.8 fL (ref 78.0–100.0)
Platelets: 202 10*3/uL (ref 150–400)
RBC: 5.04 MIL/uL (ref 4.22–5.81)
RDW: 13.1 % (ref 11.5–15.5)
WBC: 5.7 10*3/uL (ref 4.0–10.5)

## 2014-01-30 LAB — SURGICAL PCR SCREEN
MRSA, PCR: NEGATIVE
Staphylococcus aureus: NEGATIVE

## 2014-02-05 MED ORDER — CEFAZOLIN SODIUM-DEXTROSE 2-3 GM-% IV SOLR
2.0000 g | INTRAVENOUS | Status: AC
  Administered 2014-02-06: 2 g via INTRAVENOUS
  Filled 2014-02-05: qty 50
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
## 2014-02-06 ENCOUNTER — Encounter (HOSPITAL_COMMUNITY): Payer: Medicare Other | Admitting: Critical Care Medicine

## 2014-02-06 ENCOUNTER — Encounter (HOSPITAL_COMMUNITY)
Admission: RE | Disposition: A | Discharge status: Home / Self Care | Payer: Self-pay | Source: Ambulatory Visit | Attending: Neurosurgery

## 2014-02-06 ENCOUNTER — Inpatient Hospital Stay (HOSPITAL_COMMUNITY)
Admission: RE | Admit: 2014-02-06 | Discharge: 2014-02-08 | DRG: 473 | Disposition: A | Discharge status: Home / Self Care | Payer: Medicare Other | Source: Ambulatory Visit | Attending: Neurosurgery | Admitting: Neurosurgery

## 2014-02-06 ENCOUNTER — Inpatient Hospital Stay (HOSPITAL_COMMUNITY): Payer: Medicare Other | Admitting: Critical Care Medicine

## 2014-02-06 ENCOUNTER — Inpatient Hospital Stay (HOSPITAL_COMMUNITY): Payer: Medicare Other

## 2014-02-06 ENCOUNTER — Encounter (HOSPITAL_COMMUNITY): Payer: Self-pay | Admitting: Critical Care Medicine

## 2014-02-06 DIAGNOSIS — G709 Myoneural disorder, unspecified: Secondary | ICD-10-CM | POA: Diagnosis not present

## 2014-02-06 DIAGNOSIS — K219 Gastro-esophageal reflux disease without esophagitis: Secondary | ICD-10-CM | POA: Diagnosis not present

## 2014-02-06 DIAGNOSIS — E7800 Pure hypercholesterolemia, unspecified: Secondary | ICD-10-CM | POA: Diagnosis present

## 2014-02-06 DIAGNOSIS — Z8546 Personal history of malignant neoplasm of prostate: Secondary | ICD-10-CM | POA: Diagnosis not present

## 2014-02-06 DIAGNOSIS — M542 Cervicalgia: Secondary | ICD-10-CM | POA: Diagnosis not present

## 2014-02-06 DIAGNOSIS — M4802 Spinal stenosis, cervical region: Principal | ICD-10-CM | POA: Diagnosis present

## 2014-02-06 DIAGNOSIS — K449 Diaphragmatic hernia without obstruction or gangrene: Secondary | ICD-10-CM | POA: Diagnosis present

## 2014-02-06 DIAGNOSIS — M5030 Other cervical disc degeneration, unspecified cervical region: Secondary | ICD-10-CM | POA: Diagnosis not present

## 2014-02-06 HISTORY — PX: ANTERIOR CERVICAL DECOMP/DISCECTOMY FUSION: SHX1161

## 2014-02-06 SURGERY — ANTERIOR CERVICAL DECOMPRESSION/DISCECTOMY FUSION 2 LEVELS
Anesthesia: General

## 2014-02-06 MED ORDER — CITALOPRAM HYDROBROMIDE 10 MG PO TABS
10.0000 mg | ORAL_TABLET | Freq: Every day | ORAL | Status: DC
  Administered 2014-02-06 – 2014-02-07 (×2): 10 mg via ORAL
  Filled 2014-02-06 (×3): qty 1

## 2014-02-06 MED ORDER — ARTIFICIAL TEARS OP OINT
TOPICAL_OINTMENT | OPHTHALMIC | Status: DC | PRN
  Administered 2014-02-06: 1 via OPHTHALMIC

## 2014-02-06 MED ORDER — PHENYLEPHRINE 40 MCG/ML (10ML) SYRINGE FOR IV PUSH (FOR BLOOD PRESSURE SUPPORT)
PREFILLED_SYRINGE | INTRAVENOUS | Status: AC
  Filled 2014-02-06: qty 10

## 2014-02-06 MED ORDER — FENTANYL CITRATE 0.05 MG/ML IJ SOLN
INTRAMUSCULAR | Status: AC
  Filled 2014-02-06: qty 5

## 2014-02-06 MED ORDER — GLYCOPYRROLATE 0.2 MG/ML IJ SOLN
INTRAMUSCULAR | Status: DC | PRN
  Administered 2014-02-06: 0.6 mg via INTRAVENOUS

## 2014-02-06 MED ORDER — DEXAMETHASONE 4 MG PO TABS
4.0000 mg | ORAL_TABLET | Freq: Four times a day (QID) | ORAL | Status: DC
  Administered 2014-02-06 – 2014-02-08 (×6): 4 mg via ORAL
  Filled 2014-02-06 (×11): qty 1

## 2014-02-06 MED ORDER — OXYCODONE HCL 5 MG PO TABS
5.0000 mg | ORAL_TABLET | Freq: Once | ORAL | Status: AC | PRN
  Administered 2014-02-06: 5 mg via ORAL

## 2014-02-06 MED ORDER — SUCCINYLCHOLINE CHLORIDE 20 MG/ML IJ SOLN
INTRAMUSCULAR | Status: AC
  Filled 2014-02-06: qty 1

## 2014-02-06 MED ORDER — DEXAMETHASONE SODIUM PHOSPHATE 4 MG/ML IJ SOLN
4.0000 mg | Freq: Four times a day (QID) | INTRAMUSCULAR | Status: DC
  Filled 2014-02-06 (×8): qty 1

## 2014-02-06 MED ORDER — LACTATED RINGERS IV SOLN
INTRAVENOUS | Status: DC
  Administered 2014-02-06: 08:00:00 via INTRAVENOUS

## 2014-02-06 MED ORDER — MIDAZOLAM HCL 2 MG/2ML IJ SOLN
INTRAMUSCULAR | Status: AC
  Filled 2014-02-06: qty 2

## 2014-02-06 MED ORDER — EPHEDRINE SULFATE 50 MG/ML IJ SOLN
INTRAMUSCULAR | Status: AC
  Filled 2014-02-06: qty 1

## 2014-02-06 MED ORDER — THROMBIN 5000 UNITS EX SOLR
OROMUCOSAL | Status: DC | PRN
  Administered 2014-02-06: 10:00:00 via TOPICAL

## 2014-02-06 MED ORDER — EPHEDRINE SULFATE 50 MG/ML IJ SOLN
INTRAMUSCULAR | Status: DC | PRN
  Administered 2014-02-06: 5 mg via INTRAVENOUS

## 2014-02-06 MED ORDER — SODIUM CHLORIDE 0.9 % IV SOLN: INTRAVENOUS | Status: DC

## 2014-02-06 MED ORDER — FENTANYL CITRATE 0.05 MG/ML IJ SOLN
INTRAMUSCULAR | Status: AC
Start: 2014-02-06 — End: 2014-02-06
  Filled 2014-02-06: qty 5

## 2014-02-06 MED ORDER — PROPOFOL 10 MG/ML IV BOLUS
INTRAVENOUS | Status: AC
  Filled 2014-02-06: qty 20

## 2014-02-06 MED ORDER — ACETAMINOPHEN 650 MG RE SUPP: 650.0000 mg | RECTAL | Status: DC | PRN

## 2014-02-06 MED ORDER — HEMOSTATIC AGENTS (NO CHARGE) OPTIME
TOPICAL | Status: DC | PRN
  Administered 2014-02-06: 1 via TOPICAL

## 2014-02-06 MED ORDER — 0.9 % SODIUM CHLORIDE (POUR BTL) OPTIME
TOPICAL | Status: DC | PRN
  Administered 2014-02-06: 1000 mL

## 2014-02-06 MED ORDER — PROPOFOL 10 MG/ML IV BOLUS
INTRAVENOUS | Status: DC | PRN
  Administered 2014-02-06: 180 mg via INTRAVENOUS

## 2014-02-06 MED ORDER — LACTATED RINGERS IV SOLN
INTRAVENOUS | Status: DC | PRN
  Administered 2014-02-06 (×2): via INTRAVENOUS

## 2014-02-06 MED ORDER — DIAZEPAM 5 MG PO TABS
5.0000 mg | ORAL_TABLET | Freq: Four times a day (QID) | ORAL | Status: DC | PRN
  Administered 2014-02-06 – 2014-02-08 (×3): 5 mg via ORAL
  Filled 2014-02-06 (×3): qty 1

## 2014-02-06 MED ORDER — GLYCOPYRROLATE 0.2 MG/ML IJ SOLN
INTRAMUSCULAR | Status: AC
  Filled 2014-02-06: qty 3

## 2014-02-06 MED ORDER — PANTOPRAZOLE SODIUM 20 MG PO TBEC
20.0000 mg | DELAYED_RELEASE_TABLET | Freq: Every day | ORAL | Status: DC
  Administered 2014-02-06 – 2014-02-07 (×2): 20 mg via ORAL
  Filled 2014-02-06 (×3): qty 1

## 2014-02-06 MED ORDER — PHENOL 1.4 % MT LIQD: 1.0000 | OROMUCOSAL | Status: DC | PRN

## 2014-02-06 MED ORDER — MORPHINE SULFATE 2 MG/ML IJ SOLN
1.0000 mg | INTRAMUSCULAR | Status: DC | PRN
  Administered 2014-02-06: 2 mg via INTRAVENOUS
  Filled 2014-02-06: qty 1

## 2014-02-06 MED ORDER — HYDROMORPHONE HCL PF 1 MG/ML IJ SOLN
INTRAMUSCULAR | Status: AC
  Administered 2014-02-06: 0.5 mg via INTRAVENOUS
  Filled 2014-02-06: qty 1

## 2014-02-06 MED ORDER — OXYCODONE-ACETAMINOPHEN 5-325 MG PO TABS
2.0000 | ORAL_TABLET | ORAL | Status: DC | PRN
  Administered 2014-02-06 – 2014-02-07 (×3): 2 via ORAL
  Filled 2014-02-06 (×3): qty 2

## 2014-02-06 MED ORDER — ACETAMINOPHEN 325 MG PO TABS: 650.0000 mg | ORAL_TABLET | ORAL | Status: DC | PRN

## 2014-02-06 MED ORDER — MENTHOL 3 MG MT LOZG: 1.0000 | LOZENGE | OROMUCOSAL | Status: DC | PRN

## 2014-02-06 MED ORDER — NEOSTIGMINE METHYLSULFATE 1 MG/ML IJ SOLN
INTRAMUSCULAR | Status: AC
  Filled 2014-02-06: qty 10

## 2014-02-06 MED ORDER — ONDANSETRON HCL 4 MG/2ML IJ SOLN
4.0000 mg | INTRAMUSCULAR | Status: DC | PRN
Start: 2014-02-06 — End: 2014-02-08

## 2014-02-06 MED ORDER — ONDANSETRON HCL 4 MG/2ML IJ SOLN
4.0000 mg | Freq: Once | INTRAMUSCULAR | Status: DC | PRN
Start: 2014-02-06 — End: 2014-02-06

## 2014-02-06 MED ORDER — LIDOCAINE HCL (CARDIAC) 20 MG/ML IV SOLN
INTRAVENOUS | Status: DC | PRN
  Administered 2014-02-06: 50 mg via INTRAVENOUS

## 2014-02-06 MED ORDER — SODIUM CHLORIDE 0.9 % IJ SOLN
3.0000 mL | Freq: Two times a day (BID) | INTRAMUSCULAR | Status: DC
  Administered 2014-02-06 – 2014-02-07 (×2): 3 mL via INTRAVENOUS

## 2014-02-06 MED ORDER — MIDAZOLAM HCL 5 MG/5ML IJ SOLN
INTRAMUSCULAR | Status: DC | PRN
  Administered 2014-02-06: 2 mg via INTRAVENOUS

## 2014-02-06 MED ORDER — ZOLPIDEM TARTRATE 5 MG PO TABS: 5.0000 mg | ORAL_TABLET | Freq: Every evening | ORAL | Status: DC | PRN

## 2014-02-06 MED ORDER — SODIUM CHLORIDE 0.9 % IJ SOLN
INTRAMUSCULAR | Status: AC
  Filled 2014-02-06: qty 10

## 2014-02-06 MED ORDER — OXYCODONE HCL 5 MG PO TABS
ORAL_TABLET | ORAL | Status: AC
  Administered 2014-02-06: 5 mg via ORAL
  Filled 2014-02-06: qty 1

## 2014-02-06 MED ORDER — LIDOCAINE HCL (CARDIAC) 20 MG/ML IV SOLN
INTRAVENOUS | Status: AC
  Filled 2014-02-06: qty 5

## 2014-02-06 MED ORDER — ONDANSETRON HCL 4 MG/2ML IJ SOLN
INTRAMUSCULAR | Status: AC
  Filled 2014-02-06: qty 2

## 2014-02-06 MED ORDER — FENTANYL CITRATE 0.05 MG/ML IJ SOLN
INTRAMUSCULAR | Status: DC | PRN
  Administered 2014-02-06: 100 ug via INTRAVENOUS
  Administered 2014-02-06: 50 ug via INTRAVENOUS
  Administered 2014-02-06: 100 ug via INTRAVENOUS
  Administered 2014-02-06: 50 ug via INTRAVENOUS
  Administered 2014-02-06: 100 ug via INTRAVENOUS

## 2014-02-06 MED ORDER — ASPIRIN EC 81 MG PO TBEC
81.0000 mg | DELAYED_RELEASE_TABLET | Freq: Every day | ORAL | Status: DC
  Administered 2014-02-06 – 2014-02-07 (×2): 81 mg via ORAL
  Filled 2014-02-06 (×3): qty 1

## 2014-02-06 MED ORDER — ROCURONIUM BROMIDE 50 MG/5ML IV SOLN
INTRAVENOUS | Status: AC
  Filled 2014-02-06: qty 1

## 2014-02-06 MED ORDER — SODIUM CHLORIDE 0.9 % IV SOLN: 250.0000 mL | INTRAVENOUS | Status: DC

## 2014-02-06 MED ORDER — NEOSTIGMINE METHYLSULFATE 1 MG/ML IJ SOLN
INTRAMUSCULAR | Status: DC | PRN
  Administered 2014-02-06: 5 mg via INTRAVENOUS

## 2014-02-06 MED ORDER — ONDANSETRON HCL 4 MG/2ML IJ SOLN
INTRAMUSCULAR | Status: DC | PRN
  Administered 2014-02-06: 4 mg via INTRAVENOUS

## 2014-02-06 MED ORDER — OXYCODONE HCL 5 MG/5ML PO SOLN: 5.0000 mg | Freq: Once | ORAL | Status: AC | PRN

## 2014-02-06 MED ORDER — SODIUM CHLORIDE 0.9 % IJ SOLN: 3.0000 mL | INTRAMUSCULAR | Status: DC | PRN

## 2014-02-06 MED ORDER — THROMBIN 5000 UNITS EX SOLR
CUTANEOUS | Status: DC | PRN
  Administered 2014-02-06 (×2): 5000 [IU] via TOPICAL

## 2014-02-06 MED ORDER — HYDROMORPHONE HCL PF 1 MG/ML IJ SOLN
0.2500 mg | INTRAMUSCULAR | Status: DC | PRN
  Administered 2014-02-06 (×3): 0.5 mg via INTRAVENOUS

## 2014-02-06 MED ORDER — ROCURONIUM BROMIDE 100 MG/10ML IV SOLN
INTRAVENOUS | Status: DC | PRN
  Administered 2014-02-06: 50 mg via INTRAVENOUS

## 2014-02-06 MED ORDER — EZETIMIBE-SIMVASTATIN 10-20 MG PO TABS
1.0000 | ORAL_TABLET | Freq: Every day | ORAL | Status: DC
  Administered 2014-02-06 – 2014-02-07 (×2): 1 via ORAL
  Filled 2014-02-06 (×3): qty 1

## 2014-02-06 MED ORDER — ARTIFICIAL TEARS OP OINT
TOPICAL_OINTMENT | OPHTHALMIC | Status: AC
  Filled 2014-02-06: qty 3.5

## 2014-02-06 MED ORDER — GLYCOPYRROLATE 0.2 MG/ML IJ SOLN
INTRAMUSCULAR | Status: AC
  Filled 2014-02-06: qty 1

## 2014-02-06 MED ORDER — CEFAZOLIN SODIUM 1-5 GM-% IV SOLN
1.0000 g | Freq: Three times a day (TID) | INTRAVENOUS | Status: AC
  Administered 2014-02-06 – 2014-02-07 (×2): 1 g via INTRAVENOUS
  Filled 2014-02-06 (×2): qty 50

## 2014-02-06 SURGICAL SUPPLY — 63 items
BANDAGE GAUZE ELAST BULKY 4 IN (GAUZE/BANDAGES/DRESSINGS) ×6 IMPLANT
BENZOIN TINCTURE PRP APPL 2/3 (GAUZE/BANDAGES/DRESSINGS) ×3 IMPLANT
BIT DRILL SPINE QC 14 (BIT) ×3 IMPLANT
BLADE ULTRA TIP 2M (BLADE) ×3 IMPLANT
BUR BARREL STRAIGHT FLUTE 4.0 (BURR) IMPLANT
BUR MATCHSTICK NEURO 3.0 LAGG (BURR) ×6 IMPLANT
CANISTER SUCT 3000ML (MISCELLANEOUS) ×3 IMPLANT
CLOSURE WOUND 1/2 X4 (GAUZE/BANDAGES/DRESSINGS) ×1
CONT SPEC 4OZ CLIKSEAL STRL BL (MISCELLANEOUS) ×3 IMPLANT
COVER MAYO STAND STRL (DRAPES) ×3 IMPLANT
DRAIN CHANNEL 10M FLAT 3/4 FLT (DRAIN) ×3 IMPLANT
DRAPE C-ARM 42X72 X-RAY (DRAPES) ×6 IMPLANT
DRAPE LAPAROTOMY 100X72 PEDS (DRAPES) ×3 IMPLANT
DRAPE MICROSCOPE LEICA (MISCELLANEOUS) ×3 IMPLANT
DRAPE POUCH INSTRU U-SHP 10X18 (DRAPES) ×3 IMPLANT
DRSG OPSITE POSTOP 3X4 (GAUZE/BANDAGES/DRESSINGS) ×3 IMPLANT
DURAPREP 6ML APPLICATOR 50/CS (WOUND CARE) ×3 IMPLANT
ELECT REM PT RETURN 9FT ADLT (ELECTROSURGICAL) ×3
ELECTRODE REM PT RTRN 9FT ADLT (ELECTROSURGICAL) ×1 IMPLANT
EVACUATOR SILICONE 100CC (DRAIN) ×3 IMPLANT
GAUZE SPONGE 4X4 16PLY XRAY LF (GAUZE/BANDAGES/DRESSINGS) IMPLANT
GLOVE BIOGEL M 8.0 STRL (GLOVE) ×3 IMPLANT
GLOVE BIOGEL PI IND STRL 7.0 (GLOVE) ×3 IMPLANT
GLOVE BIOGEL PI INDICATOR 7.0 (GLOVE) ×6
GLOVE ECLIPSE 7.0 STRL STRAW (GLOVE) ×6 IMPLANT
GLOVE ECLIPSE 7.5 STRL STRAW (GLOVE) ×6 IMPLANT
GLOVE EXAM NITRILE LRG STRL (GLOVE) IMPLANT
GLOVE EXAM NITRILE MD LF STRL (GLOVE) IMPLANT
GLOVE EXAM NITRILE XL STR (GLOVE) IMPLANT
GLOVE EXAM NITRILE XS STR PU (GLOVE) IMPLANT
GLOVE INDICATOR 7.5 STRL GRN (GLOVE) ×3 IMPLANT
GOWN BRE IMP SLV AUR LG STRL (GOWN DISPOSABLE) IMPLANT
GOWN BRE IMP SLV AUR XL STRL (GOWN DISPOSABLE) IMPLANT
GOWN STRL REIN 2XL LVL4 (GOWN DISPOSABLE) IMPLANT
GOWN STRL REUS W/ TWL LRG LVL3 (GOWN DISPOSABLE) ×2 IMPLANT
GOWN STRL REUS W/ TWL XL LVL3 (GOWN DISPOSABLE) ×2 IMPLANT
GOWN STRL REUS W/TWL LRG LVL3 (GOWN DISPOSABLE) ×4
GOWN STRL REUS W/TWL XL LVL3 (GOWN DISPOSABLE) ×4
HALTER HD/CHIN CERV TRACTION D (MISCELLANEOUS) ×3 IMPLANT
HEMOSTAT POWDER KIT SURGIFOAM (HEMOSTASIS) IMPLANT
KIT BASIN OR (CUSTOM PROCEDURE TRAY) ×3 IMPLANT
KIT ROOM TURNOVER OR (KITS) ×3 IMPLANT
NEEDLE SPNL 22GX3.5 QUINCKE BK (NEEDLE) ×6 IMPLANT
NS IRRIG 1000ML POUR BTL (IV SOLUTION) ×3 IMPLANT
PACK LAMINECTOMY NEURO (CUSTOM PROCEDURE TRAY) ×3 IMPLANT
PATTIES SURGICAL .5 X1 (DISPOSABLE) ×3 IMPLANT
PLATE ANT CERV XTEND 2 LV 32 (Plate) ×3 IMPLANT
PUTTY DBX 1CC (Putty) ×3 IMPLANT
PUTTY DBX 1CC DEPUY (Putty) ×1 IMPLANT
RUBBERBAND STERILE (MISCELLANEOUS) ×6 IMPLANT
SCREW XTD VAR 4.2 SELF TAP (Screw) ×18 IMPLANT
SPACER ACDF SM LORDOTIC 7 (Spacer) ×3 IMPLANT
SPACER COLONIAL SZ 9-11X12-7 (Spacer) ×3 IMPLANT
SPONGE GAUZE 4X4 12PLY (GAUZE/BANDAGES/DRESSINGS) ×3 IMPLANT
SPONGE INTESTINAL PEANUT (DISPOSABLE) ×3 IMPLANT
SPONGE SURGIFOAM ABS GEL SZ50 (HEMOSTASIS) ×3 IMPLANT
STRIP CLOSURE SKIN 1/2X4 (GAUZE/BANDAGES/DRESSINGS) ×2 IMPLANT
SUT VIC AB 3-0 SH 8-18 (SUTURE) ×3 IMPLANT
SYR 20ML ECCENTRIC (SYRINGE) ×3 IMPLANT
TAPE CLOTH SURG 4X10 WHT LF (GAUZE/BANDAGES/DRESSINGS) ×3 IMPLANT
TOWEL OR 17X24 6PK STRL BLUE (TOWEL DISPOSABLE) ×3 IMPLANT
TOWEL OR 17X26 10 PK STRL BLUE (TOWEL DISPOSABLE) ×3 IMPLANT
WATER STERILE IRR 1000ML POUR (IV SOLUTION) ×3 IMPLANT

## 2014-02-06 NOTE — Transfer of Care (Signed)
Immediate Anesthesia Transfer of Care Note  Patient: Caleb Woodward  Procedure(s) Performed: Procedure(s) with comments: CERVICAL FIVE TO CERVICAL SIX, CERVICAL SIX TO CERVICAL SEVEN  ANTERIOR CERVICAL DECOMPRESSION/DISCECTOMY FUSION 2 LEVELS (N/A) - C5-6 C6-7 Anterior cervical decompression/diskectomy/fusion  Patient Location: PACU  Anesthesia Type:General  Level of Consciousness: sedated  Airway & Oxygen Therapy: Patient Spontanous Breathing and Patient connected to face mask oxygen  Post-op Assessment: Report given to PACU RN and Post -op Vital signs reviewed and stable  Post vital signs: Reviewed and stable  Complications: No apparent anesthesia complications

## 2014-02-06 NOTE — Anesthesia Procedure Notes (Signed)
Procedure Name: Intubation Date/Time: 02/06/2014 9:20 AM Performed by: Maude Leriche D Pre-anesthesia Checklist: Patient identified, Emergency Drugs available, Suction available, Patient being monitored and Timeout performed Patient Re-evaluated:Patient Re-evaluated prior to inductionOxygen Delivery Method: Circle system utilized Preoxygenation: Pre-oxygenation with 100% oxygen Intubation Type: IV induction Ventilation: Mask ventilation without difficulty and Oral airway inserted - appropriate to patient size Laryngoscope Size: Miller and 2 Grade View: Grade I Tube type: Oral Tube size: 7.5 mm Number of attempts: 1 Airway Equipment and Method: Stylet Placement Confirmation: ETT inserted through vocal cords under direct vision,  positive ETCO2 and breath sounds checked- equal and bilateral Secured at: 21 cm Tube secured with: Tape Dental Injury: Teeth and Oropharynx as per pre-operative assessment

## 2014-02-06 NOTE — Anesthesia Preprocedure Evaluation (Addendum)
Anesthesia Evaluation  Patient identified by MRN, date of birth, ID band Patient awake    Reviewed: Allergy & Precautions, H&P , NPO status , Patient's Chart, lab work & pertinent test results  Airway Mallampati: II TM Distance: >3 FB Neck ROM: Full    Dental  (+) Dental Advisory Given, Teeth Intact   Pulmonary  breath sounds clear to auscultation        Cardiovascular Rhythm:Regular Rate:Normal     Neuro/Psych  Headaches,    GI/Hepatic hiatal hernia, GERD-  Medicated,  Endo/Other    Renal/GU      Musculoskeletal   Abdominal   Peds  Hematology   Anesthesia Other Findings   Reproductive/Obstetrics                          Anesthesia Physical Anesthesia Plan  ASA: II  Anesthesia Plan: General   Post-op Pain Management:    Induction: Intravenous  Airway Management Planned: Oral ETT  Additional Equipment:   Intra-op Plan:   Post-operative Plan: Extubation in OR  Informed Consent: I have reviewed the patients History and Physical, chart, labs and discussed the procedure including the risks, benefits and alternatives for the proposed anesthesia with the patient or authorized representative who has indicated his/her understanding and acceptance.   Dental advisory given  Plan Discussed with: Anesthesiologist, Surgeon and CRNA  Anesthesia Plan Comments: (HNP C5-6 C6-7 GERD  Plan GA with oral ETT  Roberts Gaudy, MD)      Anesthesia Quick Evaluation

## 2014-02-06 NOTE — Progress Notes (Signed)
Op note [id number]
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
## 2014-02-06 NOTE — Anesthesia Postprocedure Evaluation (Signed)
  Anesthesia Post-op Note  Patient: Caleb Woodward  Procedure(s) Performed: Procedure(s) with comments: CERVICAL FIVE TO CERVICAL SIX, CERVICAL SIX TO CERVICAL SEVEN  ANTERIOR CERVICAL DECOMPRESSION/DISCECTOMY FUSION 2 LEVELS (N/A) - C5-6 C6-7 Anterior cervical decompression/diskectomy/fusion  Patient Location: PACU  Anesthesia Type:General  Level of Consciousness: awake, alert  and oriented  Airway and Oxygen Therapy: Patient Spontanous Breathing and Patient connected to nasal cannula oxygen  Post-op Pain: mild  Post-op Assessment: Post-op Vital signs reviewed, Patient's Cardiovascular Status Stable, Respiratory Function Stable, Patent Airway and Pain level controlled  Post-op Vital Signs: stable  Complications: No apparent anesthesia complications

## 2014-02-06 NOTE — Progress Notes (Signed)
Orthopedic Tech Progress Note Patient Details:  Caleb Woodward Sep 26, 1946 [id number]  Ortho Devices Type of Ortho Device: Soft collar Ortho Device/Splint Location: neck Ortho Device/Splint Interventions: Ordered;Application   Braulio Bosch 02/06/2014, 9:09 PM

## 2014-02-06 NOTE — Progress Notes (Signed)
Utilization review completed.  

## 2014-02-07 NOTE — Progress Notes (Signed)
Patient ID: Caleb Woodward, male   DOB: [birth year], 68 y.o.   MRN: [id number] Feels better, drain working. No weakness. Wound dry.

## 2014-02-07 NOTE — Evaluation (Signed)
Physical Therapy Evaluation Patient Details Name: Caleb Woodward MRN: [id number] DOB: 1946/03/05 Today's Date: 02/07/2014   History of Present Illness  68 y.o. male admitted to [facility] on 02/06/14 s/p elective anterior C5-7 decompression, discectomy, and fusion.  Pt with significant PMHx of prostate CA and surgery, L ulnar nerve surgery, GERD.  He reports he had severe bil shoulder pain PTA.    Clinical Impression  Pt is POD X1 s/p ACDF.  He is moving well, is following his precautions and showed he could safely manage the flight of stairs to get to his bedroom at home.  PT to sign off.  No further acute or f/u PT needs at this time.     Follow Up Recommendations No PT follow up    Equipment Recommendations  None recommended by PT    Recommendations for Other Services   NA    Precautions / Restrictions Precautions Precautions: Cervical Precaution Comments: handout given by OT, precuations reviewed by PT Required Braces or Orthoses: Cervical Brace Cervical Brace: Soft collar      Mobility  Bed Mobility Overal bed mobility: Modified Independent Bed Mobility: Rolling;Sidelying to Sit;Sit to Sidelying Rolling: Modified independent (Device/Increase time) Sidelying to sit: Modified independent (Device/Increase time)     Sit to sidelying: Modified independent (Device/Increase time) General bed mobility comments: HOB flat, no rails.  Pt was able to demonstrate safely based on previous education from OT  Transfers Overall transfer level: Modified independent   Transfers: Sit to/from Stand Sit to Stand: Modified independent (Device/Increase time) Stand pivot transfers: Supervision       General transfer comment: relied on hands for transitions.  Ambulation/Gait Ambulation/Gait assistance: Modified independent (Device/Increase time) Ambulation Distance (Feet): 250 Feet Assistive device: None Gait Pattern/deviations: WFL(Within Functional Limits)   Gait velocity interpretation: at  or above normal speed for age/gender General Gait Details: pt with mildly slower gait pattern and rigid posture.  good use of whole body turning to talk with me.    Stairs Stairs: Yes Stairs assistance: Modified independent (Device/Increase time) Stair Management: One rail Right;Alternating pattern;Forwards Number of Stairs: 9 General stair comments: alternating up and down.  Verbal cues to try not to stoop over to look down at his feet.  Pt with good spatial awareness.  He was using the railing for safety only.           Balance Overall balance assessment: No apparent balance deficits (not formally assessed)                                           Pertinent Vitals/Pain See vitals flow sheet.     Home Living Family/patient expects to be discharged to:: Private residence Living Arrangements: Spouse/significant other Available Help at Discharge: Family;Available 24 hours/day Type of Home: House Home Access: Stairs to enter Entrance Stairs-Rails: Psychiatric nurse of Steps: 3 Home Layout: Multi-level;Bed/bath upstairs;Full bath on main level Home Equipment: None      Prior Function Level of Independence: Independent         Comments: Environmental education officer Dominance   Dominant Hand: Right    Extremity/Trunk Assessment   Upper Extremity Assessment: Defer to OT evaluation     RUE Sensation: decreased light touch (numbness in R thumb - distal to IP)     Lower Extremity Assessment: Overall WFL for tasks assessed      Cervical /  Trunk Assessment: Normal  Communication   Communication: No difficulties  Cognition Arousal/Alertness: Awake/alert Behavior During Therapy: WFL for tasks assessed/performed Overall Cognitive Status: Within Functional Limits for tasks assessed                               Assessment/Plan    PT Assessment Patent does not need any further PT services           PT Goals  (Current goals can be found in the Care Plan section) Acute Rehab PT Goals Patient Stated Goal: to get back to work PT Goal Formulation: No goals set, d/c therapy    Frequency   NA one time eval and d/c   Barriers to discharge   None         End of Session Equipment Utilized During Treatment: Gait belt;Cervical collar Activity Tolerance: Patient tolerated treatment well Patient left: in bed;with call bell/phone within reach;with family/visitor present Nurse Communication: Mobility status         Time: 1191-4782 PT Time Calculation (min): 13 min   Charges:   PT Evaluation $Initial PT Evaluation Tier I: 1 Procedure PT Treatments $Gait Training: 8-22 mins        Jehan Ranganathan B. Jayr Lupercio, [place], DPT [phone]   02/07/2014, 1:19 PM

## 2014-02-07 NOTE — Progress Notes (Signed)
Occupational Therapy Evaluation Patient Details Name: Caleb Woodward MRN: [id number] DOB: 25-Aug-1946 Today's Date: 02/07/2014    History of Present Illness Anterior 5-6 and 6-7 diskectomy, decompression of the spinal cord bilateral foraminotomy, interbody fusion with cages and plate   Clinical Impression   PTA, pt independent with all ADL and mobility. Pt making steady progress. Completed all education with pt/wife regarding cervical precautions, compensatory techniques, Ae and DME. Handout given. Pt is ready for D/C when medically stable.    Follow Up Recommendations  No OT follow up;Supervision - Intermittent    Equipment Recommendations  Other (comment) (wife plans to get shower seat)    Recommendations for Other Services       Precautions / Restrictions Precautions Precautions: Cervical Precaution Comments: handout given Required Braces or Orthoses: Cervical Brace Cervical Brace: Soft collar      Mobility Bed Mobility Overal bed mobility: Needs Assistance Bed Mobility: Sidelying to Sit;Sit to Sidelying   Sidelying to sit: Supervision     Sit to sidelying: Supervision General bed mobility comments: vc for back precautions  Transfers Overall transfer level: Needs assistance   Transfers: Sit to/from Stand;Stand Pivot Transfers Sit to Stand: Supervision Stand pivot transfers: Supervision            Balance Overall balance assessment: No apparent balance deficits (not formally assessed)                                          ADL Overall ADL's : Needs assistance/impaired     Grooming: Supervision/safety;Set up   Upper Body Bathing: Set up;Supervision/ safety   Lower Body Bathing: Set up;Supervison/ safety   Upper Body Dressing : Supervision/safety;Set up   Lower Body Dressing: Supervision/safety;Set up   Toilet Transfer: Supervision/safety;Ambulation;Comfort height toilet   Toileting- Clothing Manipulation and Hygiene: Modified  independent;Sit to/from stand       Functional mobility during ADLs: Supervision/safety General ADL Comments: Educated pt/wife on compensatory techniques for ADL and mobility for ADL. Discussed available AE and DME recommended for shower     Vision                     Perception     Praxis      Pertinent Vitals/Pain no apparent distress      Hand Dominance Right   Extremity/Trunk Assessment Upper Extremity Assessment Upper Extremity Assessment: RUE deficits/detail RUE Sensation: decreased light touch (numbness in R thumb - distal to IP)   Lower Extremity Assessment Lower Extremity Assessment: Overall WFL for tasks assessed   Cervical / Trunk Assessment Cervical / Trunk Assessment: Normal   Communication Communication Communication: No difficulties   Cognition Arousal/Alertness: Awake/alert Behavior During Therapy: WFL for tasks assessed/performed Overall Cognitive Status: Within Functional Limits for tasks assessed                     General Comments       Exercises       Shoulder Instructions      Home Living Family/patient expects to be discharged to:: Private residence Living Arrangements: Spouse/significant other Available Help at Discharge: Family;Available 24 hours/day Type of Home: House Home Access: Stairs to enter [employer] of Steps: 3 Entrance Stairs-Rails: Right;Left Home Layout: Multi-level;Bed/bath upstairs;Full bath on main level Alternate Level Stairs-Number of Steps: flight Alternate Level Stairs-Rails: Right;Left;Can reach both Bathroom Shower/Tub: Tub/shower unit;Walk-in shower   Bathroom Toilet:  Standard Bathroom Accessibility: Yes How Accessible: Accessible via [place] Home Equipment: None          Prior Functioning/Environment Level of Independence: Independent        Comments: owns Copywriter, advertising    OT Diagnosis:     OT Problem List:     OT Treatment/Interventions:      OT  Goals(Current goals can be found in the care plan section) Acute Rehab OT Goals Patient Stated Goal: to get back to work  OT Frequency:     Barriers to D/C:            Co-evaluation              End of Session Nurse Communication: Mobility status  Activity Tolerance: Patient tolerated treatment well Patient left: in bed;with call bell/phone within reach;with family/visitor present   Time: 4008-6761 OT Time Calculation (min): 26 min Charges:  OT General Charges $OT Visit: 1 Procedure OT Evaluation $Initial OT Evaluation Tier I: 1 Procedure OT Treatments $Self Care/Home Management : 8-22 mins G-Codes:    Roney Jaffe Tyrene Nader 2014/02/24, 11:16 AM   Maurie Boettcher, OTR/L  [phone] Feb 24, 2014

## 2014-02-08 NOTE — Progress Notes (Signed)
Pt doing well. Pt and wife given D/C instructions with Rx, verbal understanding of teaching was given. Pt's IV and JP drain was D/C'd prior to D/C. Pt D/C'd home via wheelchair @ 1000 per MD order. Pt is stable @ D/C and has no other needs. Holli Humbles, RN

## 2014-02-08 NOTE — Discharge Instructions (Signed)
Wound Care °Leave incision open to air. °You may shower. °Do not scrub directly on incision.  °Leave steri-strips on neck.  They will fall off themselves. °Do not put any creams, lotions, or ointments on incision. °Activity °Walk each and every day, increasing distance each day. °No lifting greater than 5 lbs.  Avoid excessive neck motion. °No driving for 2 weeks; may ride as a passenger locally. °Wear neck brace at all times except when showering.  If provided soft collar, may wear for comfort unless otherwise instructed. °Diet °Resume your normal diet.  °Return to Work °Will be discussed at you follow up appointment. °Call Your Doctor If Any of These Occur °Redness, drainage, or swelling at the wound.  °Temperature greater than 101 degrees. °Severe pain not relieved by pain medication. °Increased difficulty swallowing. °Incision starts to come apart. °Follow Up Appt °Call today for appointment in 3-4 weeks ([phone]) or for problems.  If you have any hardware placed in your spine, you will need an x-ray before your appointment. ° °

## 2014-02-08 NOTE — Discharge Summary (Signed)
Physician Discharge Summary  Patient ID: Caleb Woodward MRN: [id number] DOB/AGE: 1945-12-21 68 y.o.  Admit date: 02/06/2014 Discharge date: 02/08/2014  Admission Diagnoses:cervical stenosis  Discharge Diagnoses:  Active Problems:   Cervical stenosis of spinal canal   Discharged Condition: no pain  Hospital Course: surgery Consults:none  Significant Diagnostic Studies: mri  Treatments: cervical fusion   Discharge Exam: Blood pressure 154/88, pulse 62, temperature 98.6 F (37 C), temperature source Oral, resp. rate 18, weight 98.884 kg (218 lb), SpO2 92.00%. No weakness  Disposition: home with wife     Medication List    ASK your doctor about these medications       aspirin EC 81 MG tablet  Take 81 mg by mouth daily.     b complex vitamins tablet  Take 1 tablet by mouth daily.     Cholecalciferol 4000 UNITS Caps  Take 4,000 Units by mouth daily.     citalopram 10 MG tablet  Commonly known as:  CELEXA  Take 10 mg by mouth daily.     ezetimibe-simvastatin 10-20 MG per tablet  Commonly known as:  VYTORIN  Take 1 tablet by mouth daily.     lansoprazole 30 MG capsule  Commonly known as:  PREVACID  Take 30 mg by mouth daily.     multivitamin with minerals Tabs tablet  Take 1 tablet by mouth daily.     vitamin B-12 1000 MCG tablet  Commonly known as:  CYANOCOBALAMIN  Take 1,000 mcg by mouth daily.           Follow-up Information   Follow up with Floyce Stakes, MD.   Specialty:  Neurosurgery   Contact information:   [address] [place] [address] [place] [address] [phone]       Signed: Floyce Stakes 02/08/2014, 9:22 AM

## 2014-02-12 ENCOUNTER — Encounter (HOSPITAL_COMMUNITY): Payer: Self-pay | Admitting: Neurosurgery

## 2014-02-23 DIAGNOSIS — R748 Abnormal levels of other serum enzymes: Secondary | ICD-10-CM | POA: Diagnosis not present

## 2014-02-26 DIAGNOSIS — K2970 Gastritis, unspecified, without bleeding: Secondary | ICD-10-CM | POA: Diagnosis not present

## 2014-02-26 DIAGNOSIS — K648 Other hemorrhoids: Secondary | ICD-10-CM | POA: Diagnosis not present

## 2014-02-26 DIAGNOSIS — K2990 Gastroduodenitis, unspecified, without bleeding: Secondary | ICD-10-CM | POA: Diagnosis not present

## 2014-02-26 DIAGNOSIS — R110 Nausea: Secondary | ICD-10-CM | POA: Diagnosis not present

## 2014-02-26 DIAGNOSIS — Z86010 Personal history of colonic polyps: Secondary | ICD-10-CM | POA: Diagnosis not present

## 2014-03-01 DIAGNOSIS — M5412 Radiculopathy, cervical region: Secondary | ICD-10-CM | POA: Diagnosis not present

## 2014-03-01 DIAGNOSIS — M542 Cervicalgia: Secondary | ICD-10-CM | POA: Diagnosis not present

## 2014-03-01 DIAGNOSIS — I10 Essential (primary) hypertension: Secondary | ICD-10-CM | POA: Diagnosis not present

## 2014-03-01 DIAGNOSIS — Z6830 Body mass index (BMI) 30.0-30.9, adult: Secondary | ICD-10-CM | POA: Diagnosis not present

## 2014-03-19 DIAGNOSIS — M542 Cervicalgia: Secondary | ICD-10-CM | POA: Diagnosis not present

## 2014-03-19 DIAGNOSIS — Z6830 Body mass index (BMI) 30.0-30.9, adult: Secondary | ICD-10-CM | POA: Diagnosis not present

## 2014-03-19 DIAGNOSIS — M5412 Radiculopathy, cervical region: Secondary | ICD-10-CM | POA: Diagnosis not present

## 2014-04-02 DIAGNOSIS — M2550 Pain in unspecified joint: Secondary | ICD-10-CM | POA: Diagnosis not present

## 2014-04-02 DIAGNOSIS — R5381 Other malaise: Secondary | ICD-10-CM | POA: Diagnosis not present

## 2014-04-02 DIAGNOSIS — Z6831 Body mass index (BMI) 31.0-31.9, adult: Secondary | ICD-10-CM | POA: Diagnosis not present

## 2014-04-09 DIAGNOSIS — E782 Mixed hyperlipidemia: Secondary | ICD-10-CM | POA: Diagnosis not present

## 2014-04-16 DIAGNOSIS — E782 Mixed hyperlipidemia: Secondary | ICD-10-CM | POA: Diagnosis not present

## 2014-04-16 DIAGNOSIS — IMO0001 Reserved for inherently not codable concepts without codable children: Secondary | ICD-10-CM | POA: Diagnosis not present

## 2014-04-16 DIAGNOSIS — Z125 Encounter for screening for malignant neoplasm of prostate: Secondary | ICD-10-CM | POA: Diagnosis not present

## 2014-04-16 DIAGNOSIS — R7301 Impaired fasting glucose: Secondary | ICD-10-CM | POA: Diagnosis not present

## 2014-04-16 DIAGNOSIS — R748 Abnormal levels of other serum enzymes: Secondary | ICD-10-CM | POA: Diagnosis not present

## 2014-04-23 DIAGNOSIS — F09 Unspecified mental disorder due to known physiological condition: Secondary | ICD-10-CM | POA: Diagnosis not present

## 2014-06-18 DIAGNOSIS — Z6831 Body mass index (BMI) 31.0-31.9, adult: Secondary | ICD-10-CM | POA: Diagnosis not present

## 2014-06-18 DIAGNOSIS — I10 Essential (primary) hypertension: Secondary | ICD-10-CM | POA: Diagnosis not present

## 2014-06-18 DIAGNOSIS — M5412 Radiculopathy, cervical region: Secondary | ICD-10-CM | POA: Diagnosis not present

## 2014-07-16 DIAGNOSIS — R413 Other amnesia: Secondary | ICD-10-CM | POA: Diagnosis not present

## 2014-07-30 DIAGNOSIS — R413 Other amnesia: Secondary | ICD-10-CM | POA: Diagnosis not present

## 2014-08-21 DIAGNOSIS — F418 Other specified anxiety disorders: Secondary | ICD-10-CM | POA: Diagnosis not present

## 2014-08-21 DIAGNOSIS — Z1389 Encounter for screening for other disorder: Secondary | ICD-10-CM | POA: Diagnosis not present

## 2014-08-21 DIAGNOSIS — Z139 Encounter for screening, unspecified: Secondary | ICD-10-CM | POA: Diagnosis not present

## 2014-08-21 DIAGNOSIS — E782 Mixed hyperlipidemia: Secondary | ICD-10-CM | POA: Diagnosis not present

## 2014-08-21 DIAGNOSIS — Z23 Encounter for immunization: Secondary | ICD-10-CM | POA: Diagnosis not present

## 2014-08-21 DIAGNOSIS — Z0000 Encounter for general adult medical examination without abnormal findings: Secondary | ICD-10-CM | POA: Diagnosis not present

## 2014-08-21 DIAGNOSIS — R7301 Impaired fasting glucose: Secondary | ICD-10-CM | POA: Diagnosis not present

## 2014-11-28 DIAGNOSIS — M7021 Olecranon bursitis, right elbow: Secondary | ICD-10-CM | POA: Diagnosis not present

## 2014-12-10 ENCOUNTER — Emergency Department (HOSPITAL_COMMUNITY): Payer: Medicare Other

## 2014-12-10 ENCOUNTER — Encounter (HOSPITAL_COMMUNITY): Payer: Self-pay | Admitting: Emergency Medicine

## 2014-12-10 ENCOUNTER — Emergency Department (HOSPITAL_COMMUNITY)
Admission: EM | Admit: 2014-12-10 | Discharge: 2014-12-10 | Disposition: A | Discharge status: Home / Self Care | Payer: Medicare Other | Attending: Emergency Medicine | Admitting: Emergency Medicine

## 2014-12-10 DIAGNOSIS — Z79899 Other long term (current) drug therapy: Secondary | ICD-10-CM | POA: Diagnosis not present

## 2014-12-10 DIAGNOSIS — R4182 Altered mental status, unspecified: Secondary | ICD-10-CM | POA: Diagnosis not present

## 2014-12-10 DIAGNOSIS — R55 Syncope and collapse: Secondary | ICD-10-CM

## 2014-12-10 DIAGNOSIS — Z8669 Personal history of other diseases of the nervous system and sense organs: Secondary | ICD-10-CM | POA: Insufficient documentation

## 2014-12-10 DIAGNOSIS — I10 Essential (primary) hypertension: Secondary | ICD-10-CM | POA: Diagnosis not present

## 2014-12-10 DIAGNOSIS — I77811 Abdominal aortic ectasia: Secondary | ICD-10-CM | POA: Diagnosis not present

## 2014-12-10 DIAGNOSIS — Z8546 Personal history of malignant neoplasm of prostate: Secondary | ICD-10-CM | POA: Diagnosis not present

## 2014-12-10 DIAGNOSIS — R42 Dizziness and giddiness: Secondary | ICD-10-CM | POA: Diagnosis not present

## 2014-12-10 DIAGNOSIS — Z7982 Long term (current) use of aspirin: Secondary | ICD-10-CM | POA: Diagnosis not present

## 2014-12-10 DIAGNOSIS — K219 Gastro-esophageal reflux disease without esophagitis: Secondary | ICD-10-CM | POA: Diagnosis not present

## 2014-12-10 DIAGNOSIS — E780 Pure hypercholesterolemia: Secondary | ICD-10-CM | POA: Insufficient documentation

## 2014-12-10 DIAGNOSIS — I951 Orthostatic hypotension: Secondary | ICD-10-CM | POA: Insufficient documentation

## 2014-12-10 LAB — COMPREHENSIVE METABOLIC PANEL
ALT: 26 U/L (ref 0–53)
AST: 40 U/L — ABNORMAL HIGH (ref 0–37)
Albumin: 4.2 g/dL (ref 3.5–5.2)
Alkaline Phosphatase: 50 U/L (ref 39–117)
Anion gap: 9 (ref 5–15)
BUN: 15 mg/dL (ref 6–23)
CO2: 23 mmol/L (ref 19–32)
Calcium: 9.1 mg/dL (ref 8.4–10.5)
Chloride: 103 mmol/L (ref 96–112)
Creatinine, Ser: 1.13 mg/dL (ref 0.50–1.35)
GFR calc Af Amer: 75 mL/min — ABNORMAL LOW (ref >90)
GFR calc non Af Amer: 64 mL/min — ABNORMAL LOW (ref >90)
Glucose, Bld: 142 mg/dL — ABNORMAL HIGH (ref 70–99)
Potassium: 3.9 mmol/L (ref 3.5–5.1)
Sodium: 135 mmol/L (ref 135–145)
Total Bilirubin: 0.6 mg/dL (ref 0.3–1.2)
Total Protein: 7 g/dL (ref 6.0–8.3)

## 2014-12-10 LAB — CBC WITH DIFFERENTIAL/PLATELET
Basophils Absolute: 0 10*3/uL (ref 0.0–0.1)
Basophils Relative: 1 % (ref 0–1)
Eosinophils Absolute: 0.2 10*3/uL (ref 0.0–0.7)
Eosinophils Relative: 3 % (ref 0–5)
HCT: 41.9 % (ref 39.0–52.0)
Hemoglobin: 14.3 g/dL (ref 13.0–17.0)
Lymphocytes Relative: 32 % (ref 12–46)
Lymphs Abs: 2.1 10*3/uL (ref 0.7–4.0)
MCH: 27.1 pg (ref 26.0–34.0)
MCHC: 34.1 g/dL (ref 30.0–36.0)
MCV: 79.5 fL (ref 78.0–100.0)
Monocytes Absolute: 0.3 10*3/uL (ref 0.1–1.0)
Monocytes Relative: 5 % (ref 3–12)
Neutro Abs: 3.9 10*3/uL (ref 1.7–7.7)
Neutrophils Relative %: 59 % (ref 43–77)
Platelets: 219 10*3/uL (ref 150–400)
RBC: 5.27 MIL/uL (ref 4.22–5.81)
RDW: 13 % (ref 11.5–15.5)
WBC: 6.6 10*3/uL (ref 4.0–10.5)

## 2014-12-10 LAB — URINALYSIS, ROUTINE W REFLEX MICROSCOPIC
Bilirubin Urine: NEGATIVE
Glucose, UA: NEGATIVE mg/dL
Hgb urine dipstick: NEGATIVE
Ketones, ur: NEGATIVE mg/dL
Leukocytes, UA: NEGATIVE
Nitrite: NEGATIVE
Protein, ur: NEGATIVE mg/dL
Specific Gravity, Urine: 1.02 (ref 1.005–1.030)
Urobilinogen, UA: 0.2 mg/dL (ref 0.0–1.0)
pH: 5.5 (ref 5.0–8.0)

## 2014-12-10 LAB — I-STAT TROPONIN, ED: Troponin i, poc: 0 ng/mL (ref 0.00–0.08)

## 2014-12-10 MED ORDER — ACETAMINOPHEN 500 MG PO TABS
1000.0000 mg | ORAL_TABLET | Freq: Once | ORAL | Status: AC
  Administered 2014-12-10: 1000 mg via ORAL
  Filled 2014-12-10: qty 2

## 2014-12-10 MED ORDER — SODIUM CHLORIDE 0.9 % IV BOLUS (SEPSIS)
1000.0000 mL | Freq: Once | INTRAVENOUS | Status: AC
  Administered 2014-12-10: 1000 mL via INTRAVENOUS

## 2014-12-10 NOTE — ED Notes (Signed)
Pt instructed by PCP to come to ED for evaluation of syncopal episode yesterday- pt family reports pt had full syncopal episode yesterday with vomiting yesterday, pt refused to come to ED yesterday.  Pt admits to occasional memory loss and dizziness recently- currently alert and oriented X 4 at present.

## 2014-12-10 NOTE — ED Provider Notes (Signed)
CSN: [id number]     Arrival date & time 12/10/14  1816 History   First MD Initiated Contact with Patient 12/10/14 1926     Chief Complaint  Patient presents with  . 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 Patient reports yesterday he was watching TV and lying on the couch when he then got up from the couch and began walking to the bathroom when he suddenly began feeling as if he was spinning. Patient reports she become nauseous and vomited several times. Paramedics responded and evaluated him and patient declined wanting to go to the ED for further evaluation. Wife states his blood pressure that time was 109 systolic which is low for him. Patient states his dizziness resolved after resting for a couple hours. Patient awoke this morning and stated he had mild dizziness this morning and otherwise is felt tired. He denies having any weakness, numbness, tingling. He also reports having mild frontal headache which she states he has had in the past. No recent fevers, chills, neck pain, abdominal pain, diarrhea, chest pain, palpitations, shortness of breath. No other complaints at this time. Past Medical History  Diagnosis Date  . GERD (gastroesophageal reflux disease)   . H/O hiatal hernia   . Neuromuscular disorder     numbness and tingling  . Headache(784.0)     migraines, some confusion afterward  . Cancer 2012    Prostate cancer  . Elevated cholesterol    Past Surgical History  Procedure Laterality Date  . Prostate surgery  11/27/2010  . Ulnar nerve repair Left 2014  . Colonoscopy w/ biopsies and polypectomy    . Anterior cervical decomp/discectomy fusion N/A 02/06/2014    Procedure: CERVICAL FIVE TO CERVICAL SIX, CERVICAL SIX TO  CERVICAL SEVEN  ANTERIOR CERVICAL DECOMPRESSION/DISCECTOMY FUSION 2 LEVELS;  Surgeon: Floyce Stakes, MD;  Location: [facility];  Service: Neurosurgery;  Laterality: N/A;  C5-6 C6-7 Anterior cervical decompression/diskectomy/fusion   No family history on file. History  Substance Use Topics  . Smoking status: Never Smoker   . Smokeless tobacco: Never Used  . Alcohol Use: No    Review of Systems  Constitutional: Negative for fever, activity change and appetite change.  HENT: Negative for congestion, rhinorrhea and sore throat.   Eyes: Negative for visual disturbance.  Respiratory: Negative for cough and shortness of breath.   Cardiovascular: Negative for chest pain, palpitations and leg swelling.  Gastrointestinal: Negative for nausea, vomiting, abdominal pain and diarrhea.  Genitourinary: Negative for dysuria, flank pain, decreased urine volume and difficulty urinating.  Musculoskeletal: Negative for back pain and neck pain.  Skin: Negative for rash.  Neurological: Positive for dizziness and syncope. Negative for tremors, seizures, facial asymmetry, speech difficulty, weakness, light-headedness, numbness and headaches.  Psychiatric/Behavioral: Negative for confusion.      Allergies  Review of patient's allergies indicates no known allergies.  Home Medications   Prior to Admission medications   Medication Sig Start Date End Date Taking? Authorizing Provider  aspirin EC 81 MG tablet Take 81 mg by mouth daily.    Historical Provider, MD  b complex vitamins tablet Take 1 tablet by mouth daily.    Historical Provider, MD  Cholecalciferol 4000 UNITS CAPS Take 4,000 Units by mouth daily.    Historical Provider, MD  citalopram (CELEXA)  10 MG tablet Take 10 mg by mouth daily.    Historical Provider, MD  ezetimibe-simvastatin (VYTORIN) 10-20 MG per tablet Take 1 tablet by mouth daily.    Historical Provider, MD  lansoprazole (PREVACID) 30 MG capsule Take 30 mg by mouth daily.     Historical Provider, MD  Multiple Vitamin (MULTIVITAMIN WITH MINERALS) TABS tablet Take 1 tablet by mouth daily.    Historical Provider, MD  vitamin B-12 (CYANOCOBALAMIN) 1000 MCG tablet Take 1,000 mcg by mouth daily.    Historical Provider, MD   BP 165/89 mmHg  Pulse 72  Temp(Src) 97.7 F (36.5 C) (Oral)  Resp 17  SpO2 97% Physical Exam  Constitutional: He is oriented to person, place, and time. He appears well-developed and well-nourished. No distress.  HENT:  Head: Normocephalic and atraumatic.  Nose: Nose normal.  Mouth/Throat: Oropharynx is clear and moist. No oropharyngeal exudate.  Eyes: Conjunctivae and EOM are normal.  Neck: Normal range of motion. Neck supple. No JVD present.  Cardiovascular: Normal rate, regular rhythm, normal heart sounds and intact distal pulses.   Pulmonary/Chest: Effort normal and breath sounds normal. No respiratory distress. He has no wheezes. He has no rales. He exhibits no tenderness.  Abdominal: Soft. He exhibits no distension. There is no tenderness. There is no rebound and no guarding.  Musculoskeletal: Normal range of motion.  Neurological: He is alert and oriented to person, place, and time. No cranial nerve deficit.  HDS, AAOx4. PERRL, EOMI, TML, face sym. CN 2-12 grossly intact. 5/5 sym, no drift, SILT, normal gait and coordination.  Neg dix hallpike. Neg HINTS exam.  Skin: Skin is warm and dry. No rash noted.  Psychiatric: He has a normal mood and affect.  Nursing note and vitals reviewed.   ED Course  Procedures (including critical care time) Labs Review Labs Reviewed  COMPREHENSIVE METABOLIC PANEL - Abnormal; Notable for the following:    Glucose, Bld 142 (*)    AST 40 (*)    GFR calc non Af Amer 64 (*)    GFR calc Af Amer 75 (*)    All other components within normal limits  CBC WITH DIFFERENTIAL/PLATELET  URINALYSIS, ROUTINE W REFLEX MICROSCOPIC  I-STAT TROPOININ, ED    Imaging Review Dg Chest 2 View  12/10/2014   CLINICAL  DATA:  Syncope and occasional dizziness  EXAM: CHEST  2 VIEW  COMPARISON:  January 30, 2014  FINDINGS: There is no edema or consolidation. Heart size and pulmonary vascularity are normal. No adenopathy. There is postoperative change in the lower cervical spine region.  IMPRESSION: No edema or consolidation.   Electronically Signed   By: Lowella Grip M.D.   On: 12/10/2014 18:51   Ct Head Wo Contrast  12/10/2014   CLINICAL DATA:  Episode of syncope yesterday. Intermittent dizziness and occasional memory loss  EXAM: CT HEAD WITHOUT CONTRAST  TECHNIQUE: Contiguous axial images were obtained from the base of the skull through the vertex without intravenous contrast.  COMPARISON:  Head CT and brain MRI October 18, 2012  FINDINGS: The ventricles are normal in size and configuration. There is no intracranial mass, hemorrhage, extra-axial fluid collection, or midline shift. Gray-white compartments appear normal. No acute infarct apparent. Bony calvarium appears intact. Mastoid air cells are clear. There is extensive ethmoid sinus disease bilaterally. There is mild mucosal thickening in both maxillary antra. There is bilateral sphenoid sinus disease as well.  IMPRESSION: Multifocal paranasal sinus disease. No intracranial mass, hemorrhage, or focal gray -white compartment lesions/acute  appearing infarct.   Electronically Signed   By: Lowella Grip M.D.   On: 12/10/2014 21:06   US Aorta  12/10/2014   CLINICAL DATA:  Near syncope.  Abdominal aortic aneurysm.  EXAM: ULTRASOUND OF ABDOMINAL AORTA  TECHNIQUE: Ultrasound examination of the abdominal aorta was performed to evaluate for abdominal aortic aneurysm.  COMPARISON:  None.  FINDINGS: Abdominal Aorta  No aneurysm identified.  Maximum AP  Diameter:  2.5  Maximum TRV  Diameter: 2.1  RIGHT common iliac artery measures 1.2 cm x 1.2 cm.  The LEFT common iliac artery measures 1.2 cm x 1.5 cm.  Distal abdominal aorta demonstrates atherosclerosis.  IMPRESSION: Infrarenal  abdominal aortic ectasia measuring 2.5 cm. Ectatic abdominal aorta at risk for aneurysm development. Recommend follow up by [place] in 5 years. This recommendation follows ACR consensus guidelines: White Paper of the ACR Incidental Findings Committee II on Vascular Findings. J Am Coll Radiol 2013; 10:789-794.   Electronically Signed   By: Dereck Ligas M.D.   On: 12/10/2014 22:55     EKG Interpretation   Date/Time:  Monday December 10 2014 18:32:18 EST Ventricular Rate:  67 PR Interval:  184 QRS Duration: 86 QT Interval:  402 QTC Calculation: 424 R Axis:   34 Text Interpretation:  Normal sinus rhythm Normal ECG No significant change  was found Confirmed by Wyvonnia Dusky  MD, Xolani Degracia [id number]) on 12/10/2014 7:51:03 PM      MDM   Final diagnoses:  None    Bronsyn Chapa is a 69 y.o. male with H&P as above. P/w near syncope. Prodrome: stood from laying, acute onset dizziness, then vomiting, then disorientation which resolved with rest.   No family h/o sudden cardiac death, h/o CAD/MI, anemia/dark stools, CP/SOB/palpitations, abd pain, seizure activity, weakness.   Exam without signs of conjunctival pallor, head injury. Abd soft, ND, no pulsatile mass.  Patient had positive orthostatic vital signs. Patient was given IV fluid bolus. EKG shows NSR, no signs of WPW, Brugada, Long QT, HOCM.  This pt is in nad, afvss, non-toxic appearing.  HA onset was slow, not quick or thunderclap, doubt ich.  Pt has no focal neuro sx, neuro exam is wnl, no visual disturbance, and HA is described as typical HA, doubt intracranial abnormality (aneurysm or mass) and vertebral artery or carotid artery dissection.  No infectious sx, no meningismus, afebrile, no ams, doubt meningitis.  No ectopy on monitor. No syncope in ED.  Labs reassuring. Patient had a reassuring head CT and ultrasound of the abdomen. Patient was informed of his 2.5 cm abdominal aortic ectasia and shifted to follow closely with his PCP regarding repeat  ultrasound in 5 years. I feel this is likely secondary to orthostatic hypotension. Patient is a malignant ED well now. Patient may also be having some mild peripheral vertigo symptoms as well.  Discussed at length with patient her low risk of having a serious life-threatening cause (death, myocardial infarction, arrhythmia, pulmonary embolism, stroke, subarachnoid hemorrhage, significant hemorrhage, or any condition causing a return ED visit and hospitalization for a related event) of her syncopal episode based on her unremarkable labs and EKG in the ED. Advised her to follow closely with her PCP. Deemed stable for discharge.  Clinical Impression: 1. Near syncope   2. Orthostatic hypotension     Disposition: Discharge  Condition: Good  I have discussed the results, Dx and Tx plan with the pt(& family if present). He/she/they expressed understanding and agree(s) with the plan. Discharge instructions discussed at great length.  Strict return precautions discussed and pt &/or family have verbalized understanding of the instructions. No further questions at time of discharge.    Discharge Medication List as of 12/10/2014 11:09 PM      Follow Up: Maryella Shivers, MD  Schedule an appointment as soon as possible for a visit in 1 week   [place] [address] [id number] [place] [phone]  If symptoms worsen   Pt seen in conjunction with Dr. Aram Beecham, [facility] Resident - PGY-2    Kirstie Peri, MD 12/11/14 0511  Ezequiel Essex, MD 12/11/14 1447

## 2014-12-10 NOTE — Discharge Instructions (Signed)
Orthostatic Hypotension Orthostatic hypotension is a sudden drop in blood pressure. It happens when you quickly stand up from a seated or lying position. You may feel dizzy or light-headed. This can last for just a few seconds or for up to a few minutes. It is usually not a serious problem. However, if this happens frequently or gets worse, it can be a sign of something more serious. CAUSES  Different things can cause orthostatic hypotension, including:   Loss of body fluids (dehydration).  Medicines that lower blood pressure.  Sudden changes in posture, such as standing up quickly after you have been sitting or lying down.  Taking too much of your medicine. SIGNS AND SYMPTOMS   Light-headedness or dizziness.   Fainting or near-fainting.   A fast heart rate.   Weakness.   Feeling tired (fatigue).  DIAGNOSIS  Your health care provider may do several things to help diagnose your condition and identify the cause. These may include:   Taking a medical history and doing a physical exam.  Checking your blood pressure. Your health care provider will check your blood pressure when you are:  Lying down.  Sitting.  Standing.  Using tilt table testing. In this test, you lie down on a table that moves from a lying position to a standing position. You will be strapped onto the table. This test monitors your blood pressure and heart rate when you are in different positions. TREATMENT  Treatment will vary depending on the cause. Possible treatments include:   Changing the dosage of your medicines.  Wearing compression stockings on your lower legs.  Standing up slowly after sitting or lying down.  Eating more salt.  Eating frequent, small meals.  In some cases, getting IV fluids.  Taking medicine to enhance fluid retention. HOME CARE INSTRUCTIONS  Only take over-the-counter or prescription medicines as directed by your health care provider.  Follow your health care  provider's instructions for changing the dosage of your current medicines.  Do not stop or adjust your medicine on your own.  Stand up slowly after sitting or lying down. This allows your body to adjust to the different position.  Wear compression stockings as directed.  Eat extra salt as directed.  Do not add extra salt to your diet unless directed to by your health care provider.  Eat frequent, small meals.  Avoid standing suddenly after eating.  Avoid hot showers or excessive heat as directed by your health care provider.  Keep all follow-up appointments. SEEK MEDICAL CARE IF:  You continue to feel dizzy or light-headed after standing.  You feel groggy or confused.  You feel cold, clammy, or sick to your stomach (nauseous).  You have blurred vision.  You feel short of breath. SEEK IMMEDIATE MEDICAL CARE IF:   You faint after standing.  You have chest pain.  You have difficulty breathing.   You lose feeling or movement in your arms or legs.   You have slurred speech or difficulty talking, or you are unable to talk.  MAKE SURE YOU:   Understand these instructions.  Will watch your condition.  Will get help right away if you are not doing well or get worse. Document Released: 10/09/2002 Document Revised: 10/24/2013 Document Reviewed: 08/11/2013 [facility] Patient Information 2015 [place], [place]. This information is not intended to replace advice given to you by your health care provider. Make sure you discuss any questions you have with your health care provider.  Near-Syncope Near-syncope (commonly known as near fainting)  is sudden weakness, dizziness, or feeling like you might pass out. This can happen when getting up or while standing for a long time. It is caused by a sudden decrease in blood flow to the brain, which can occur for various reasons. Most of the reasons are not serious.  HOME CARE Watch your condition for any changes.  Have someone stay  with you until you feel stable.  If you feel like you are going to pass out:  Lie down right away.  Prop your feet up if you can.  Breathe deeply and steadily.  Move only when the feeling has gone away. Most of the time, this feeling lasts only a few minutes. You may feel tired for several hours.  Drink enough fluids to keep your pee (urine) clear or pale yellow.  If you are taking blood pressure or heart medicine, stand up slowly.  Follow up with your doctor as told. GET HELP RIGHT AWAY IF:   You have a severe headache.  You have unusual pain in the chest, belly (abdomen), or back.  You have bleeding from the mouth or butt (rectum), or you have black or tarry poop (stool).  You feel your heart beat differently than normal, or you have a very fast pulse.  You pass out, or you twitch and shake when you pass out.  You pass out when sitting or lying down.  You feel confused.  You have trouble walking.  You are weak.  You have vision problems. MAKE SURE YOU:   Understand these instructions.  Will watch your condition.  Will get help right away if you are not doing well or get worse. Document Released: 04/06/2008 Document Revised: 10/24/2013 Document Reviewed: 03/24/2013 [facility] Patient Information 2015 [place], [place]. This information is not intended to replace advice given to you by your health care provider. Make sure you discuss any questions you have with your health care provider.

## 2014-12-19 DIAGNOSIS — R42 Dizziness and giddiness: Secondary | ICD-10-CM | POA: Diagnosis not present

## 2014-12-19 DIAGNOSIS — Z6831 Body mass index (BMI) 31.0-31.9, adult: Secondary | ICD-10-CM | POA: Diagnosis not present

## 2015-01-14 DIAGNOSIS — R7301 Impaired fasting glucose: Secondary | ICD-10-CM | POA: Diagnosis not present

## 2015-01-14 DIAGNOSIS — E782 Mixed hyperlipidemia: Secondary | ICD-10-CM | POA: Diagnosis not present

## 2015-01-14 DIAGNOSIS — I10 Essential (primary) hypertension: Secondary | ICD-10-CM | POA: Diagnosis not present

## 2015-01-22 DIAGNOSIS — E782 Mixed hyperlipidemia: Secondary | ICD-10-CM | POA: Diagnosis not present

## 2015-01-22 DIAGNOSIS — F418 Other specified anxiety disorders: Secondary | ICD-10-CM | POA: Diagnosis not present

## 2015-01-22 DIAGNOSIS — R7309 Other abnormal glucose: Secondary | ICD-10-CM | POA: Diagnosis not present

## 2015-01-22 DIAGNOSIS — Z6831 Body mass index (BMI) 31.0-31.9, adult: Secondary | ICD-10-CM | POA: Diagnosis not present

## 2015-01-30 DIAGNOSIS — R7309 Other abnormal glucose: Secondary | ICD-10-CM | POA: Diagnosis not present

## 2015-02-05 ENCOUNTER — Telehealth: Payer: Self-pay | Admitting: *Deleted

## 2015-02-05 NOTE — Telephone Encounter (Signed)
On 02-05-15 fax medical records to [facility] , it was consult note.

## 2015-07-18 DIAGNOSIS — E782 Mixed hyperlipidemia: Secondary | ICD-10-CM | POA: Diagnosis not present

## 2015-07-18 DIAGNOSIS — R7301 Impaired fasting glucose: Secondary | ICD-10-CM | POA: Diagnosis not present

## 2015-07-18 DIAGNOSIS — I10 Essential (primary) hypertension: Secondary | ICD-10-CM | POA: Diagnosis not present

## 2015-08-05 DIAGNOSIS — Z6830 Body mass index (BMI) 30.0-30.9, adult: Secondary | ICD-10-CM | POA: Diagnosis not present

## 2015-08-05 DIAGNOSIS — F418 Other specified anxiety disorders: Secondary | ICD-10-CM | POA: Diagnosis not present

## 2015-08-05 DIAGNOSIS — E782 Mixed hyperlipidemia: Secondary | ICD-10-CM | POA: Diagnosis not present

## 2015-08-05 DIAGNOSIS — I10 Essential (primary) hypertension: Secondary | ICD-10-CM | POA: Diagnosis not present

## 2015-08-26 DIAGNOSIS — M19011 Primary osteoarthritis, right shoulder: Secondary | ICD-10-CM | POA: Diagnosis not present

## 2015-09-03 DIAGNOSIS — Z0000 Encounter for general adult medical examination without abnormal findings: Secondary | ICD-10-CM | POA: Diagnosis not present

## 2015-09-03 DIAGNOSIS — Z1389 Encounter for screening for other disorder: Secondary | ICD-10-CM | POA: Diagnosis not present

## 2015-09-03 DIAGNOSIS — Z139 Encounter for screening, unspecified: Secondary | ICD-10-CM | POA: Diagnosis not present

## 2015-09-03 DIAGNOSIS — Z9181 History of falling: Secondary | ICD-10-CM | POA: Diagnosis not present

## 2015-09-18 DIAGNOSIS — M19011 Primary osteoarthritis, right shoulder: Secondary | ICD-10-CM | POA: Diagnosis not present

## 2015-12-09 DIAGNOSIS — K219 Gastro-esophageal reflux disease without esophagitis: Secondary | ICD-10-CM | POA: Diagnosis not present

## 2015-12-09 DIAGNOSIS — F418 Other specified anxiety disorders: Secondary | ICD-10-CM | POA: Diagnosis not present

## 2015-12-09 DIAGNOSIS — R7303 Prediabetes: Secondary | ICD-10-CM | POA: Diagnosis not present

## 2015-12-09 DIAGNOSIS — E782 Mixed hyperlipidemia: Secondary | ICD-10-CM | POA: Diagnosis not present

## 2015-12-09 DIAGNOSIS — I10 Essential (primary) hypertension: Secondary | ICD-10-CM | POA: Diagnosis not present

## 2015-12-27 DIAGNOSIS — H524 Presbyopia: Secondary | ICD-10-CM | POA: Diagnosis not present

## 2015-12-27 DIAGNOSIS — H2513 Age-related nuclear cataract, bilateral: Secondary | ICD-10-CM | POA: Diagnosis not present

## 2016-04-30 DIAGNOSIS — E782 Mixed hyperlipidemia: Secondary | ICD-10-CM | POA: Diagnosis not present

## 2016-04-30 DIAGNOSIS — R7301 Impaired fasting glucose: Secondary | ICD-10-CM | POA: Diagnosis not present

## 2016-05-07 DIAGNOSIS — N529 Male erectile dysfunction, unspecified: Secondary | ICD-10-CM | POA: Diagnosis not present

## 2016-05-07 DIAGNOSIS — E782 Mixed hyperlipidemia: Secondary | ICD-10-CM | POA: Diagnosis not present

## 2016-05-07 DIAGNOSIS — R7303 Prediabetes: Secondary | ICD-10-CM | POA: Diagnosis not present

## 2016-05-07 DIAGNOSIS — F418 Other specified anxiety disorders: Secondary | ICD-10-CM | POA: Diagnosis not present

## 2016-05-28 DIAGNOSIS — M533 Sacrococcygeal disorders, not elsewhere classified: Secondary | ICD-10-CM | POA: Diagnosis not present

## 2016-05-28 DIAGNOSIS — Z6831 Body mass index (BMI) 31.0-31.9, adult: Secondary | ICD-10-CM | POA: Diagnosis not present

## 2016-06-06 ENCOUNTER — Emergency Department (HOSPITAL_COMMUNITY): Payer: No Typology Code available for payment source

## 2016-06-06 ENCOUNTER — Emergency Department (HOSPITAL_COMMUNITY)
Admission: EM | Admit: 2016-06-06 | Discharge: 2016-06-06 | Disposition: A | Discharge status: Home / Self Care | Payer: No Typology Code available for payment source | Attending: Emergency Medicine | Admitting: Emergency Medicine

## 2016-06-06 ENCOUNTER — Encounter (HOSPITAL_COMMUNITY): Payer: Self-pay | Admitting: Emergency Medicine

## 2016-06-06 DIAGNOSIS — S199XXA Unspecified injury of neck, initial encounter: Secondary | ICD-10-CM | POA: Diagnosis not present

## 2016-06-06 DIAGNOSIS — Y999 Unspecified external cause status: Secondary | ICD-10-CM | POA: Diagnosis not present

## 2016-06-06 DIAGNOSIS — Y939 Activity, unspecified: Secondary | ICD-10-CM | POA: Insufficient documentation

## 2016-06-06 DIAGNOSIS — M25512 Pain in left shoulder: Secondary | ICD-10-CM | POA: Diagnosis not present

## 2016-06-06 DIAGNOSIS — M545 Low back pain: Secondary | ICD-10-CM | POA: Diagnosis not present

## 2016-06-06 DIAGNOSIS — M542 Cervicalgia: Secondary | ICD-10-CM | POA: Diagnosis not present

## 2016-06-06 DIAGNOSIS — S3992XA Unspecified injury of lower back, initial encounter: Secondary | ICD-10-CM | POA: Diagnosis not present

## 2016-06-06 DIAGNOSIS — M25552 Pain in left hip: Secondary | ICD-10-CM | POA: Diagnosis not present

## 2016-06-06 DIAGNOSIS — Y92410 Unspecified street and highway as the place of occurrence of the external cause: Secondary | ICD-10-CM | POA: Insufficient documentation

## 2016-06-06 DIAGNOSIS — Z8546 Personal history of malignant neoplasm of prostate: Secondary | ICD-10-CM | POA: Diagnosis not present

## 2016-06-06 DIAGNOSIS — Z7982 Long term (current) use of aspirin: Secondary | ICD-10-CM | POA: Insufficient documentation

## 2016-06-06 DIAGNOSIS — S79912A Unspecified injury of left hip, initial encounter: Secondary | ICD-10-CM | POA: Diagnosis not present

## 2016-06-06 DIAGNOSIS — S4992XA Unspecified injury of left shoulder and upper arm, initial encounter: Secondary | ICD-10-CM | POA: Diagnosis not present

## 2016-06-06 MED ORDER — ACETAMINOPHEN 500 MG PO TABS: 500.0000 mg | ORAL_TABLET | Freq: Four times a day (QID) | ORAL | 0 refills | Status: DC | PRN

## 2016-06-06 MED ORDER — ACETAMINOPHEN 325 MG PO TABS
650.0000 mg | ORAL_TABLET | Freq: Once | ORAL | Status: AC
  Administered 2016-06-06: 650 mg via ORAL
  Filled 2016-06-06: qty 2

## 2016-06-06 MED ORDER — IBUPROFEN 600 MG PO TABS: 600.0000 mg | ORAL_TABLET | Freq: Four times a day (QID) | ORAL | 0 refills | Status: DC | PRN
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
## 2016-06-08 DIAGNOSIS — Z6831 Body mass index (BMI) 31.0-31.9, adult: Secondary | ICD-10-CM | POA: Diagnosis not present

## 2016-06-08 DIAGNOSIS — R413 Other amnesia: Secondary | ICD-10-CM | POA: Diagnosis not present

## 2016-06-08 DIAGNOSIS — M542 Cervicalgia: Secondary | ICD-10-CM | POA: Diagnosis not present

## 2016-06-18 DIAGNOSIS — R200 Anesthesia of skin: Secondary | ICD-10-CM | POA: Diagnosis not present

## 2016-06-18 DIAGNOSIS — Z6831 Body mass index (BMI) 31.0-31.9, adult: Secondary | ICD-10-CM | POA: Diagnosis not present

## 2016-06-18 DIAGNOSIS — R030 Elevated blood-pressure reading, without diagnosis of hypertension: Secondary | ICD-10-CM | POA: Diagnosis not present

## 2016-06-18 DIAGNOSIS — R51 Headache: Secondary | ICD-10-CM | POA: Diagnosis not present

## 2016-06-18 DIAGNOSIS — M542 Cervicalgia: Secondary | ICD-10-CM | POA: Diagnosis not present

## 2016-07-08 DIAGNOSIS — M5137 Other intervertebral disc degeneration, lumbosacral region: Secondary | ICD-10-CM | POA: Diagnosis not present

## 2016-07-08 DIAGNOSIS — M25512 Pain in left shoulder: Secondary | ICD-10-CM | POA: Diagnosis not present

## 2016-07-08 DIAGNOSIS — M5432 Sciatica, left side: Secondary | ICD-10-CM | POA: Diagnosis not present

## 2016-07-08 DIAGNOSIS — M545 Low back pain: Secondary | ICD-10-CM | POA: Diagnosis not present

## 2016-07-09 DIAGNOSIS — S060X0D Concussion without loss of consciousness, subsequent encounter: Secondary | ICD-10-CM | POA: Diagnosis not present

## 2016-07-09 DIAGNOSIS — Z6831 Body mass index (BMI) 31.0-31.9, adult: Secondary | ICD-10-CM | POA: Diagnosis not present

## 2016-07-09 DIAGNOSIS — M79606 Pain in leg, unspecified: Secondary | ICD-10-CM | POA: Diagnosis not present

## 2016-07-18 DIAGNOSIS — M5137 Other intervertebral disc degeneration, lumbosacral region: Secondary | ICD-10-CM | POA: Diagnosis not present

## 2016-07-23 DIAGNOSIS — M5137 Other intervertebral disc degeneration, lumbosacral region: Secondary | ICD-10-CM | POA: Diagnosis not present

## 2016-07-23 DIAGNOSIS — M5432 Sciatica, left side: Secondary | ICD-10-CM | POA: Diagnosis not present

## 2016-07-23 DIAGNOSIS — M4806 Spinal stenosis, lumbar region: Secondary | ICD-10-CM | POA: Diagnosis not present

## 2016-07-28 DIAGNOSIS — M4806 Spinal stenosis, lumbar region: Secondary | ICD-10-CM | POA: Diagnosis not present

## 2016-08-17 DIAGNOSIS — Z6831 Body mass index (BMI) 31.0-31.9, adult: Secondary | ICD-10-CM | POA: Diagnosis not present

## 2016-08-17 DIAGNOSIS — M5416 Radiculopathy, lumbar region: Secondary | ICD-10-CM | POA: Diagnosis not present

## 2016-08-25 ENCOUNTER — Other Ambulatory Visit: Payer: Self-pay | Admitting: Neurosurgery

## 2016-09-11 ENCOUNTER — Other Ambulatory Visit (HOSPITAL_COMMUNITY): Payer: Self-pay | Admitting: *Deleted

## 2016-09-11 ENCOUNTER — Encounter (HOSPITAL_COMMUNITY)
Admission: RE | Admit: 2016-09-11 | Discharge: 2016-09-11 | Disposition: A | Discharge status: Home / Self Care | Payer: Medicare Other | Source: Ambulatory Visit | Attending: Neurosurgery | Admitting: Neurosurgery

## 2016-09-11 ENCOUNTER — Encounter (HOSPITAL_COMMUNITY): Payer: Self-pay

## 2016-09-11 DIAGNOSIS — Z01812 Encounter for preprocedural laboratory examination: Secondary | ICD-10-CM | POA: Insufficient documentation

## 2016-09-11 LAB — CBC
HCT: 39.6 % (ref 39.0–52.0)
Hemoglobin: 13.3 g/dL (ref 13.0–17.0)
MCH: 27.4 pg (ref 26.0–34.0)
MCHC: 33.6 g/dL (ref 30.0–36.0)
MCV: 81.6 fL (ref 78.0–100.0)
Platelets: 212 10*3/uL (ref 150–400)
RBC: 4.85 MIL/uL (ref 4.22–5.81)
RDW: 14.1 % (ref 11.5–15.5)
WBC: 4.9 10*3/uL (ref 4.0–10.5)

## 2016-09-11 LAB — BASIC METABOLIC PANEL
Anion gap: 9 (ref 5–15)
BUN: 14 mg/dL (ref 6–20)
CO2: 23 mmol/L (ref 22–32)
Calcium: 9.6 mg/dL (ref 8.9–10.3)
Chloride: 107 mmol/L (ref 101–111)
Creatinine, Ser: 1.33 mg/dL — ABNORMAL HIGH (ref 0.61–1.24)
GFR calc Af Amer: >60 mL/min (ref >60)
GFR calc non Af Amer: 53 mL/min — ABNORMAL LOW (ref >60)
Glucose, Bld: 94 mg/dL (ref 65–99)
Potassium: 4.3 mmol/L (ref 3.5–5.1)
Sodium: 139 mmol/L (ref 135–145)

## 2016-09-11 LAB — SURGICAL PCR SCREEN
MRSA, PCR: NEGATIVE
Staphylococcus aureus: NEGATIVE

## 2016-09-11 NOTE — Progress Notes (Signed)
Pt denies cardiac history, chest pain or sob. 

## 2016-09-11 NOTE — Pre-Procedure Instructions (Signed)
Caleb Woodward  09/11/2016     Your procedure is scheduled on Tuesday, September 15, 2016 at 12:50 PM.   Report to [facility] Entrance "A" Admitting Office at 9:50 AM.   Call this number if you have problems the morning of surgery: [phone]   Questions prior to day of surgery, please call [phone] between 8 & 4 PM.   Remember:  Do not eat food or drink liquids after midnight Monday, 09/14/16.  Take these medicines the morning of surgery with A SIP OF WATER: Citalopram (Celexa), Omeprazole (Prilosec)  Stop Multivitamins as of today. Do not use any Aspirin products or NSAIDS (Ibuprofen, Aleve,etc.) prior to surgery   Do not wear jewelry.  Do not wear lotions, powders, or cologne.  Men may shave face and neck.  Do not bring valuables to the hospital.  [facility] is not responsible for any belongings or valuables.  Contacts, dentures or bridgework may not be worn into surgery.  Leave your suitcase in the car.  After surgery it may be brought to your room.  For patients admitted to the hospital, discharge time will be determined by your treatment team.  Patients discharged the day of surgery will not be allowed to drive home.   Special instructions:  [place] - Preparing for Surgery  Before surgery, you can play an important role.  Because skin is not sterile, your skin needs to be as free of germs as possible.  You can reduce the number of germs on you skin by washing with CHG (chlorahexidine gluconate) soap before surgery.  CHG is an antiseptic cleaner which kills germs and bonds with the skin to continue killing germs even after washing.  Please DO NOT use if you have an allergy to CHG or antibacterial soaps.  If your skin becomes reddened/irritated stop using the CHG and inform your nurse when you arrive at Short Stay.  Do not shave (including legs and underarms) for at least 48 hours prior to the first CHG shower.  You may shave your face.  Please follow  these instructions carefully:   1.  Shower with CHG Soap the night before surgery and the                    morning of Surgery.  2.  If you choose to wash your hair, wash your hair first as usual with your       normal shampoo.  3.  After you shampoo, rinse your hair and body thoroughly to remove the shampoo.  4.  Use CHG as you would any other liquid soap.  You can apply chg directly       to the skin and wash gently with scrungie or a clean washcloth.  5.  Apply the CHG Soap to your body ONLY FROM THE NECK DOWN.        Do not use on open wounds or open sores.  Avoid contact with your eyes, ears, mouth and genitals (private parts).  Wash genitals (private parts) with your normal soap.  6.  Wash thoroughly, paying special attention to the area where your surgery        will be performed.  7.  Thoroughly rinse your body with warm water from the neck down.  8.  DO NOT shower/wash with your normal soap after using and rinsing off       the CHG Soap.  9.  Pat yourself dry with a clean towel.  10.  Wear clean pajamas.            11.  Place clean sheets on your bed the night of your first shower and do not        sleep with pets.  Day of Surgery  Do not apply any lotions the morning of surgery.  Please wear clean clothes to the hospital.   Please read over the following fact sheets that you were given.

## 2016-09-14 MED ORDER — CEFAZOLIN SODIUM-DEXTROSE 2-4 GM/100ML-% IV SOLN
2.0000 g | INTRAVENOUS | Status: AC
  Administered 2016-09-15: 2 g via INTRAVENOUS
  Filled 2016-09-14: qty 100

## 2016-09-15 ENCOUNTER — Inpatient Hospital Stay (HOSPITAL_COMMUNITY): Payer: Medicare Other

## 2016-09-15 ENCOUNTER — Encounter (HOSPITAL_COMMUNITY): Payer: Self-pay | Admitting: *Deleted

## 2016-09-15 ENCOUNTER — Encounter (HOSPITAL_COMMUNITY)
Admission: RE | Disposition: A | Discharge status: Home / Self Care | Payer: Self-pay | Source: Ambulatory Visit | Attending: Neurosurgery

## 2016-09-15 ENCOUNTER — Ambulatory Visit (HOSPITAL_COMMUNITY): Payer: Medicare Other | Admitting: Anesthesiology

## 2016-09-15 ENCOUNTER — Inpatient Hospital Stay (HOSPITAL_COMMUNITY)
Admission: RE | Admit: 2016-09-15 | Discharge: 2016-09-16 | DRG: 517 | Disposition: A | Discharge status: Home / Self Care | Payer: Medicare Other | Source: Ambulatory Visit | Attending: Neurosurgery | Admitting: Neurosurgery

## 2016-09-15 DIAGNOSIS — M5417 Radiculopathy, lumbosacral region: Secondary | ICD-10-CM | POA: Diagnosis not present

## 2016-09-15 DIAGNOSIS — M4807 Spinal stenosis, lumbosacral region: Principal | ICD-10-CM | POA: Diagnosis present

## 2016-09-15 DIAGNOSIS — Z981 Arthrodesis status: Secondary | ICD-10-CM | POA: Diagnosis not present

## 2016-09-15 DIAGNOSIS — M48061 Spinal stenosis, lumbar region without neurogenic claudication: Secondary | ICD-10-CM | POA: Diagnosis present

## 2016-09-15 DIAGNOSIS — M5416 Radiculopathy, lumbar region: Secondary | ICD-10-CM | POA: Diagnosis not present

## 2016-09-15 DIAGNOSIS — Z419 Encounter for procedure for purposes other than remedying health state, unspecified: Secondary | ICD-10-CM

## 2016-09-15 DIAGNOSIS — Z7982 Long term (current) use of aspirin: Secondary | ICD-10-CM | POA: Diagnosis not present

## 2016-09-15 DIAGNOSIS — Z8546 Personal history of malignant neoplasm of prostate: Secondary | ICD-10-CM

## 2016-09-15 DIAGNOSIS — Z825 Family history of asthma and other chronic lower respiratory diseases: Secondary | ICD-10-CM

## 2016-09-15 DIAGNOSIS — Z8249 Family history of ischemic heart disease and other diseases of the circulatory system: Secondary | ICD-10-CM | POA: Diagnosis not present

## 2016-09-15 DIAGNOSIS — M4802 Spinal stenosis, cervical region: Secondary | ICD-10-CM | POA: Diagnosis not present

## 2016-09-15 DIAGNOSIS — K449 Diaphragmatic hernia without obstruction or gangrene: Secondary | ICD-10-CM | POA: Diagnosis present

## 2016-09-15 DIAGNOSIS — K219 Gastro-esophageal reflux disease without esophagitis: Secondary | ICD-10-CM | POA: Diagnosis present

## 2016-09-15 HISTORY — PX: LUMBAR LAMINECTOMY/DECOMPRESSION MICRODISCECTOMY: SHX5026

## 2016-09-15 LAB — CBC
HCT: 40.6 % (ref 39.0–52.0)
Hemoglobin: 13.6 g/dL (ref 13.0–17.0)
MCH: 27.5 pg (ref 26.0–34.0)
MCHC: 33.5 g/dL (ref 30.0–36.0)
MCV: 82.2 fL (ref 78.0–100.0)
Platelets: 238 10*3/uL (ref 150–400)
RBC: 4.94 MIL/uL (ref 4.22–5.81)
RDW: 13.5 % (ref 11.5–15.5)
WBC: 9.7 10*3/uL (ref 4.0–10.5)

## 2016-09-15 LAB — CREATININE, SERUM
Creatinine, Ser: 1.03 mg/dL (ref 0.61–1.24)
GFR calc Af Amer: >60 mL/min (ref >60)
GFR calc non Af Amer: >60 mL/min (ref >60)

## 2016-09-15 SURGERY — LUMBAR LAMINECTOMY/DECOMPRESSION MICRODISCECTOMY 2 LEVELS
Anesthesia: General | Site: Spine Lumbar | Laterality: Left

## 2016-09-15 MED ORDER — ONDANSETRON HCL 4 MG/2ML IJ SOLN: 4.0000 mg | INTRAMUSCULAR | Status: DC | PRN

## 2016-09-15 MED ORDER — SODIUM CHLORIDE 0.9 % IV SOLN: INTRAVENOUS | Status: DC

## 2016-09-15 MED ORDER — MORPHINE SULFATE (PF) 4 MG/ML IV SOLN: 1.0000 mg | INTRAVENOUS | Status: DC | PRN

## 2016-09-15 MED ORDER — SODIUM CHLORIDE 0.9% FLUSH
3.0000 mL | Freq: Two times a day (BID) | INTRAVENOUS | Status: DC
Start: 2016-09-15 — End: 2016-09-16

## 2016-09-15 MED ORDER — OXYCODONE-ACETAMINOPHEN 5-325 MG PO TABS
ORAL_TABLET | ORAL | Status: AC
  Filled 2016-09-15: qty 2

## 2016-09-15 MED ORDER — ONDANSETRON HCL 4 MG/2ML IJ SOLN
INTRAMUSCULAR | Status: AC
  Filled 2016-09-15: qty 2

## 2016-09-15 MED ORDER — EPHEDRINE 5 MG/ML INJ
INTRAVENOUS | Status: AC
  Filled 2016-09-15: qty 10

## 2016-09-15 MED ORDER — ROCURONIUM BROMIDE 10 MG/ML (PF) SYRINGE
PREFILLED_SYRINGE | INTRAVENOUS | Status: AC
  Filled 2016-09-15: qty 10

## 2016-09-15 MED ORDER — ARTIFICIAL TEARS OP OINT
TOPICAL_OINTMENT | OPHTHALMIC | Status: AC
  Filled 2016-09-15: qty 3.5

## 2016-09-15 MED ORDER — ACETAMINOPHEN 325 MG PO TABS: 650.0000 mg | ORAL_TABLET | ORAL | Status: DC | PRN

## 2016-09-15 MED ORDER — ASPIRIN EC 81 MG PO TBEC
81.0000 mg | DELAYED_RELEASE_TABLET | Freq: Every day | ORAL | Status: DC
  Administered 2016-09-15 – 2016-09-16 (×2): 81 mg via ORAL
  Filled 2016-09-15 (×2): qty 1

## 2016-09-15 MED ORDER — EZETIMIBE 10 MG PO TABS
10.0000 mg | ORAL_TABLET | Freq: Every day | ORAL | Status: DC
  Administered 2016-09-15 – 2016-09-16 (×2): 10 mg via ORAL
  Filled 2016-09-15 (×2): qty 1

## 2016-09-15 MED ORDER — BUPIVACAINE HCL (PF) 0.5 % IJ SOLN
INTRAMUSCULAR | Status: DC | PRN
  Administered 2016-09-15: 10 mL

## 2016-09-15 MED ORDER — SODIUM CHLORIDE 0.9% FLUSH: 3.0000 mL | INTRAVENOUS | Status: DC | PRN

## 2016-09-15 MED ORDER — SUCCINYLCHOLINE CHLORIDE 200 MG/10ML IV SOSY
PREFILLED_SYRINGE | INTRAVENOUS | Status: AC
  Filled 2016-09-15: qty 10

## 2016-09-15 MED ORDER — BUPIVACAINE HCL (PF) 0.5 % IJ SOLN
INTRAMUSCULAR | Status: AC
  Filled 2016-09-15: qty 30

## 2016-09-15 MED ORDER — FENTANYL CITRATE (PF) 100 MCG/2ML IJ SOLN
INTRAMUSCULAR | Status: AC
  Filled 2016-09-15: qty 2

## 2016-09-15 MED ORDER — CEFAZOLIN IN D5W 1 GM/50ML IV SOLN
1.0000 g | Freq: Three times a day (TID) | INTRAVENOUS | Status: AC
  Administered 2016-09-15 – 2016-09-16 (×2): 1 g via INTRAVENOUS
  Filled 2016-09-15 (×2): qty 50

## 2016-09-15 MED ORDER — THROMBIN 5000 UNITS EX SOLR
CUTANEOUS | Status: AC
  Filled 2016-09-15: qty 10000

## 2016-09-15 MED ORDER — ENOXAPARIN SODIUM 30 MG/0.3ML ~~LOC~~ SOLN
30.0000 mg | SUBCUTANEOUS | Status: DC
  Administered 2016-09-16: 30 mg via SUBCUTANEOUS
  Filled 2016-09-15: qty 0.3

## 2016-09-15 MED ORDER — LIDOCAINE 2% (20 MG/ML) 5 ML SYRINGE
INTRAMUSCULAR | Status: DC | PRN
  Administered 2016-09-15: 50 mg via INTRAVENOUS

## 2016-09-15 MED ORDER — EPHEDRINE SULFATE-NACL 50-0.9 MG/10ML-% IV SOSY
PREFILLED_SYRINGE | INTRAVENOUS | Status: DC | PRN
  Administered 2016-09-15: 10 mg via INTRAVENOUS

## 2016-09-15 MED ORDER — MENTHOL 3 MG MT LOZG
LOZENGE | OROMUCOSAL | Status: AC
  Filled 2016-09-15: qty 9

## 2016-09-15 MED ORDER — MORPHINE SULFATE (PF) 2 MG/ML IV SOLN: 1.0000 mg | INTRAVENOUS | Status: DC | PRN

## 2016-09-15 MED ORDER — PHENOL 1.4 % MT LIQD: 1.0000 | OROMUCOSAL | Status: DC | PRN

## 2016-09-15 MED ORDER — THROMBIN 5000 UNITS EX SOLR
CUTANEOUS | Status: DC | PRN
  Administered 2016-09-15 (×2): 5000 [IU] via TOPICAL

## 2016-09-15 MED ORDER — MENTHOL 3 MG MT LOZG
1.0000 | LOZENGE | OROMUCOSAL | Status: DC | PRN
  Administered 2016-09-15: 3 mg via ORAL
  Filled 2016-09-15: qty 9

## 2016-09-15 MED ORDER — CYCLOBENZAPRINE HCL 10 MG PO TABS
ORAL_TABLET | ORAL | Status: AC
  Filled 2016-09-15: qty 1

## 2016-09-15 MED ORDER — ROCURONIUM BROMIDE 100 MG/10ML IV SOLN
INTRAVENOUS | Status: DC | PRN
  Administered 2016-09-15 (×2): 10 mg via INTRAVENOUS
  Administered 2016-09-15: 50 mg via INTRAVENOUS

## 2016-09-15 MED ORDER — SUGAMMADEX SODIUM 200 MG/2ML IV SOLN
INTRAVENOUS | Status: DC | PRN
  Administered 2016-09-15: 200 mg via INTRAVENOUS

## 2016-09-15 MED ORDER — CHLORHEXIDINE GLUCONATE CLOTH 2 % EX PADS: 6.0000 | MEDICATED_PAD | Freq: Once | CUTANEOUS | Status: DC

## 2016-09-15 MED ORDER — ZOLPIDEM TARTRATE 5 MG PO TABS
5.0000 mg | ORAL_TABLET | Freq: Every evening | ORAL | Status: DC | PRN
Start: 2016-09-15 — End: 2016-09-16
  Administered 2016-09-15: 5 mg via ORAL
  Filled 2016-09-15: qty 1

## 2016-09-15 MED ORDER — LACTATED RINGERS IV SOLN
INTRAVENOUS | Status: DC
  Administered 2016-09-15 (×2): via INTRAVENOUS

## 2016-09-15 MED ORDER — METHYLPREDNISOLONE ACETATE 80 MG/ML IJ SUSP
INTRAMUSCULAR | Status: DC | PRN
  Administered 2016-09-15: 80 mg

## 2016-09-15 MED ORDER — PROPOFOL 10 MG/ML IV BOLUS
INTRAVENOUS | Status: AC
  Filled 2016-09-15: qty 20

## 2016-09-15 MED ORDER — FENTANYL CITRATE (PF) 100 MCG/2ML IJ SOLN
INTRAMUSCULAR | Status: DC | PRN
  Administered 2016-09-15: 100 ug via INTRAVENOUS

## 2016-09-15 MED ORDER — 0.9 % SODIUM CHLORIDE (POUR BTL) OPTIME
TOPICAL | Status: DC | PRN
  Administered 2016-09-15: 1000 mL

## 2016-09-15 MED ORDER — OXYCODONE-ACETAMINOPHEN 5-325 MG PO TABS
1.0000 | ORAL_TABLET | ORAL | Status: DC | PRN
  Administered 2016-09-15 – 2016-09-16 (×4): 2 via ORAL
  Filled 2016-09-15 (×3): qty 2

## 2016-09-15 MED ORDER — ONDANSETRON HCL 4 MG/2ML IJ SOLN
INTRAMUSCULAR | Status: DC | PRN
  Administered 2016-09-15: 4 mg via INTRAVENOUS

## 2016-09-15 MED ORDER — CYCLOBENZAPRINE HCL 10 MG PO TABS
10.0000 mg | ORAL_TABLET | Freq: Three times a day (TID) | ORAL | Status: DC | PRN
  Administered 2016-09-15: 10 mg via ORAL

## 2016-09-15 MED ORDER — ACETAMINOPHEN 650 MG RE SUPP: 650.0000 mg | RECTAL | Status: DC | PRN

## 2016-09-15 MED ORDER — LIDOCAINE 2% (20 MG/ML) 5 ML SYRINGE
INTRAMUSCULAR | Status: AC
  Filled 2016-09-15: qty 5

## 2016-09-15 MED ORDER — PROPOFOL 10 MG/ML IV BOLUS
INTRAVENOUS | Status: DC | PRN
  Administered 2016-09-15: 170 mg via INTRAVENOUS

## 2016-09-15 MED ORDER — CITALOPRAM HYDROBROMIDE 10 MG PO TABS
10.0000 mg | ORAL_TABLET | Freq: Every day | ORAL | Status: DC
  Administered 2016-09-15 – 2016-09-16 (×2): 10 mg via ORAL
  Filled 2016-09-15 (×2): qty 1

## 2016-09-15 MED ORDER — SENNA 8.6 MG PO TABS
1.0000 | ORAL_TABLET | Freq: Two times a day (BID) | ORAL | Status: DC
  Administered 2016-09-15 – 2016-09-16 (×2): 8.6 mg via ORAL
  Filled 2016-09-15 (×2): qty 1

## 2016-09-15 MED ORDER — FENTANYL CITRATE (PF) 100 MCG/2ML IJ SOLN
INTRAMUSCULAR | Status: DC | PRN
Start: 2016-09-15 — End: 2016-09-15
  Administered 2016-09-15: 100 ug via INTRAVENOUS

## 2016-09-15 MED ORDER — VITAMIN D 1000 UNITS PO TABS
4000.0000 [IU] | ORAL_TABLET | Freq: Every day | ORAL | Status: DC
  Administered 2016-09-16: 4000 [IU] via ORAL
  Filled 2016-09-15: qty 4

## 2016-09-15 MED ORDER — HEMOSTATIC AGENTS (NO CHARGE) OPTIME
TOPICAL | Status: DC | PRN
  Administered 2016-09-15: 1 via TOPICAL

## 2016-09-15 MED ORDER — METHYLPREDNISOLONE ACETATE 80 MG/ML IJ SUSP
INTRAMUSCULAR | Status: AC
  Filled 2016-09-15: qty 1

## 2016-09-15 MED ORDER — DEXAMETHASONE SODIUM PHOSPHATE 10 MG/ML IJ SOLN
INTRAMUSCULAR | Status: DC | PRN
  Administered 2016-09-15: 10 mg via INTRAVENOUS

## 2016-09-15 SURGICAL SUPPLY — 49 items
BENZOIN TINCTURE PRP APPL 2/3 (GAUZE/BANDAGES/DRESSINGS) ×3 IMPLANT
BLADE CLIPPER SURG (BLADE) IMPLANT
BUR ACORN 6.0 (BURR) IMPLANT
BUR ACORN 6.0MM (BURR)
BUR MATCHSTICK NEURO 3.0 LAGG (BURR) ×3 IMPLANT
CANISTER SUCT 3000ML PPV (MISCELLANEOUS) ×3 IMPLANT
CLOSURE WOUND 1/2 X4 (GAUZE/BANDAGES/DRESSINGS) ×1
DRAPE LAPAROTOMY 100X72X124 (DRAPES) ×3 IMPLANT
DRAPE MICROSCOPE LEICA (MISCELLANEOUS) ×3 IMPLANT
DRAPE POUCH INSTRU U-SHP 10X18 (DRAPES) ×3 IMPLANT
DRSG PAD ABDOMINAL 8X10 ST (GAUZE/BANDAGES/DRESSINGS) IMPLANT
DURAPREP 26ML APPLICATOR (WOUND CARE) ×3 IMPLANT
ELECT REM PT RETURN 9FT ADLT (ELECTROSURGICAL) ×3
ELECTRODE REM PT RTRN 9FT ADLT (ELECTROSURGICAL) ×1 IMPLANT
GAUZE SPONGE 4X4 12PLY STRL (GAUZE/BANDAGES/DRESSINGS) ×3 IMPLANT
GAUZE SPONGE 4X4 16PLY XRAY LF (GAUZE/BANDAGES/DRESSINGS) IMPLANT
GLOVE BIOGEL M 8.0 STRL (GLOVE) ×3 IMPLANT
GLOVE EXAM NITRILE LRG STRL (GLOVE) IMPLANT
GLOVE EXAM NITRILE XL STR (GLOVE) IMPLANT
GLOVE EXAM NITRILE XS STR PU (GLOVE) IMPLANT
GOWN STRL REUS W/ TWL LRG LVL3 (GOWN DISPOSABLE) ×1 IMPLANT
GOWN STRL REUS W/ TWL XL LVL3 (GOWN DISPOSABLE) IMPLANT
GOWN STRL REUS W/TWL 2XL LVL3 (GOWN DISPOSABLE) IMPLANT
GOWN STRL REUS W/TWL LRG LVL3 (GOWN DISPOSABLE) ×2
GOWN STRL REUS W/TWL XL LVL3 (GOWN DISPOSABLE)
KIT BASIN OR (CUSTOM PROCEDURE TRAY) ×3 IMPLANT
KIT ROOM TURNOVER OR (KITS) ×3 IMPLANT
NEEDLE HYPO 18GX1.5 BLUNT FILL (NEEDLE) IMPLANT
NEEDLE HYPO 21X1.5 SAFETY (NEEDLE) IMPLANT
NEEDLE HYPO 25X1 1.5 SAFETY (NEEDLE) IMPLANT
NEEDLE SPNL 20GX3.5 QUINCKE YW (NEEDLE) IMPLANT
NS IRRIG 1000ML POUR BTL (IV SOLUTION) ×3 IMPLANT
PACK LAMINECTOMY NEURO (CUSTOM PROCEDURE TRAY) ×3 IMPLANT
PAD ARMBOARD 7.5X6 YLW CONV (MISCELLANEOUS) ×9 IMPLANT
PATTIES SURGICAL .5 X1 (DISPOSABLE) ×3 IMPLANT
RUBBERBAND STERILE (MISCELLANEOUS) ×6 IMPLANT
SPONGE GAUZE 4X4 12PLY STER LF (GAUZE/BANDAGES/DRESSINGS) ×3 IMPLANT
SPONGE LAP 4X18 X RAY DECT (DISPOSABLE) IMPLANT
SPONGE SURGIFOAM ABS GEL SZ50 (HEMOSTASIS) ×3 IMPLANT
STRIP CLOSURE SKIN 1/2X4 (GAUZE/BANDAGES/DRESSINGS) ×2 IMPLANT
SUT VIC AB 0 CT1 18XCR BRD8 (SUTURE) ×1 IMPLANT
SUT VIC AB 0 CT1 8-18 (SUTURE) ×2
SUT VIC AB 2-0 CP2 18 (SUTURE) ×3 IMPLANT
SUT VIC AB 3-0 SH 8-18 (SUTURE) ×3 IMPLANT
SYR 5ML LL (SYRINGE) IMPLANT
TAPE CLOTH SURG 4X10 WHT LF (GAUZE/BANDAGES/DRESSINGS) ×3 IMPLANT
TOWEL OR 17X24 6PK STRL BLUE (TOWEL DISPOSABLE) ×3 IMPLANT
TOWEL OR 17X26 10 PK STRL BLUE (TOWEL DISPOSABLE) ×3 IMPLANT
WATER STERILE IRR 1000ML POUR (IV SOLUTION) ×3 IMPLANT

## 2016-09-15 NOTE — H&P (Signed)
NAME:  Caleb Woodward, Caleb Woodward                     ACCOUNT NO.:  MEDICAL RECORD NO.:  [id number]  LOCATION:                                 FACILITY:  PHYSICIAN:  Leeroy Cha, M.D.   DATE OF BIRTH:  [birth year]  DATE OF ADMISSION: DATE OF DISCHARGE:                             HISTORY & PHYSICAL   The patient to be admitted on September 15, 2016.  HISTORY OF PRESENT ILLNESS:  Caleb Woodward is a gentleman who comes with his wife complaining of back pain radiation to the left leg associated with a burning sensation, weakness.  The patient had been seen by Orthopedic surgeon.  He had an epidural injection, medication and he is not any better.  The patient had an MRI and he was sent to [place] for evaluation.  PAST MEDICAL HISTORY:  He has a problem with memory.  He had difficulty with concentration, urinary incontinence, cancer of the prostate, neck pain.  PAST SURGICAL HISTORY:  He has a surgery of the prostate, ulnar nerve decompression and cervical fusion.  FAMILY HISTORY:  Positive for COPD and heart disease.  SOCIAL HISTORY:  He is nonsmoker and nondrinker.  PHYSICAL EXAMINATION:  HEAD, EARS AND NOSE:  Normal. NECK:  He has a scar anteriorly.  He has some discomfort with mobility. CARDIOVASCULAR:  Normal. LUNGS:  Clear. HEART:  Sounds normal. ABDOMEN:  Normal. EXTREMITIES:  Normal pulses. NEURO:  He is oriented x3.  He has a weakness of plantar flexion of the left foot.  He has some sensory changes, which involved mostly the S1 dermatome with a highly positive sciatic notch, tenderness on the left side.  He had difficulty walking in tip torso heel.  Again, he denies any pain or weakness in the right leg.  The x-rays showed that he has a stenosis at the level of lumbar 4-5 and 5-1 with a probably herniated disk at L5-1 to the left.  RECOMMENDATION:  The patient wants to proceed with surgery.  The procedure would be left L4-5 and L5-S1 laminotomy and laminectomy with foraminotomy and  probably diskectomy.  He and his wife knows about the risks with the surgery such as infection, CSF leak, pain, worsening of the weakness, hematoma, no improvement whatsoever, and surgery in the future, which might require fusion.          ______________________________ Leeroy Cha, M.D.     EB/MEDQ  D:  09/14/2016  T:  09/15/2016  Job:  [id number]

## 2016-09-15 NOTE — Transfer of Care (Signed)
Immediate Anesthesia Transfer of Care Note  Patient: Caleb Woodward  Procedure(s) Performed: Procedure(s) with comments: LEFT LUMBAR FOUR-FIVE, LUMBAR FIVE-SACRAL ONE MICRODISCECTOMY (Left) - LEFT L4-5 L5-S1 MICRODISCECTOMY  Patient Location: PACU  Anesthesia Type:General  Level of Consciousness: awake, alert  and oriented  Airway & Oxygen Therapy: Patient Spontanous Breathing and Patient connected to nasal cannula oxygen  Post-op Assessment: Report given to RN, Post -op Vital signs reviewed and stable and Patient moving all extremities  Post vital signs: Reviewed and stable  Last Vitals:  Vitals:   09/15/16 1004  BP: (!) 145/86  Pulse: 69  Resp: 18  Temp: 36.8 C    Last Pain:  Vitals:   09/15/16 1013  TempSrc:   PainSc: 6       Patients Stated Pain Goal: 4 ([id number])  Complications: No apparent anesthesia complications
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
## 2016-09-15 NOTE — Anesthesia Procedure Notes (Signed)
Procedure Name: Intubation Date/Time: 09/15/2016 3:13 PM Performed by: Trixie Deis A Pre-anesthesia Checklist: Patient identified, Emergency Drugs available, Suction available and Patient being monitored Patient Re-evaluated:Patient Re-evaluated prior to inductionOxygen Delivery Method: Circle System Utilized Preoxygenation: Pre-oxygenation with 100% oxygen Intubation Type: IV induction Ventilation: Mask ventilation without difficulty and Oral airway inserted - appropriate to patient size Laryngoscope Size: Mac and 4 Grade View: Grade I Tube type: Oral Tube size: 7.5 mm Number of attempts: 1 Airway Equipment and Method: Stylet and Oral airway Placement Confirmation: ETT inserted through vocal cords under direct vision,  positive ETCO2 and breath sounds checked- equal and bilateral Secured at: 22 cm Tube secured with: Tape Dental Injury: Teeth and Oropharynx as per pre-operative assessment

## 2016-09-15 NOTE — Anesthesia Postprocedure Evaluation (Signed)
Anesthesia Post Note  Patient: Caleb Woodward  Procedure(s) Performed: Procedure(s) (LRB): LEFT LUMBAR FOUR-FIVE, LUMBAR FIVE-SACRAL ONE MICRODISCECTOMY (Left)  Patient location during evaluation: PACU Anesthesia Type: General Level of consciousness: awake and alert, oriented and patient cooperative Pain management: pain level controlled Vital Signs Assessment: post-procedure vital signs reviewed and stable Respiratory status: spontaneous breathing, nonlabored ventilation, respiratory function stable and patient connected to nasal cannula oxygen Cardiovascular status: blood pressure returned to baseline and stable Postop Assessment: no signs of nausea or vomiting Anesthetic complications: no    Last Vitals:  Vitals:   09/15/16 1004 09/15/16 1650  BP: (!) 145/86   Pulse: 69   Resp: 18   Temp: 36.8 C 36.6 C    Last Pain:  Vitals:   09/15/16 1013  TempSrc:   PainSc: 6                  Cartina Brousseau,E. Teniqua Marron

## 2016-09-16 ENCOUNTER — Encounter (HOSPITAL_COMMUNITY): Payer: Self-pay | Admitting: Neurosurgery

## 2016-09-16 NOTE — Discharge Summary (Signed)
Physician Discharge Summary  Patient ID: Manton Lather MRN: [id number] DOB/AGE: [birth year] 70 y.o.  Admit date: 09/15/2016 Discharge date: 09/16/2016  Admission Diagnoses:lumbar stenosis  Discharge Diagnoses:  Active Problems:   Lumbar stenosis   Discharged Condition: no pain  Hospital Course: surgery  Consults:none  Significant Diagnostic Studies:mri  Treatments: lumbar decompression  Discharge Exam: Blood pressure 137/81, pulse 61, temperature 98.2 F (36.8 C), resp. rate 18, height 5' 10.5" (1.791 m), weight 99.4 kg (219 lb 3.2 oz), SpO2 98 %. No pain no weakness Disposition: 01-Home or Self Care      Signed: Aimee Heldman M 09/16/2016, 11:14 AM
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
## 2016-09-16 NOTE — Evaluation (Signed)
Physical Therapy Evaluation and Discharge Patient Details Name: Caleb Woodward MRN: [id number] DOB: [birth year] Today's Date: 09/16/2016   History of Present Illness  70 yo male s/p L 4-5 L5-s1  MICRODISCECTOMY PMH:  Clinical Impression  Patient evaluated by Physical Therapy with no further acute PT needs identified. All education has been completed and the patient has no further questions. At the time of PT eval pt was able to perform transfers and ambulation with modified independence to independence. Pt completed stair training and was educated on walking program and precautions/safety for home. See below for any follow-up Physical Therapy or equipment needs. PT is signing off. Thank you for this referral.     Follow Up Recommendations Outpatient PT (When appropriate per post-op protocol)    Equipment Recommendations  None recommended by PT    Recommendations for Other Services       Precautions / Restrictions Precautions Precautions: Back Precaution Comments: Reviewed handout with pt and wife. Pt was cued for precautions during functional mobility.  Restrictions Weight Bearing Restrictions: No      Mobility  Bed Mobility Overal bed mobility: Modified Independent             General bed mobility comments: Pt received exiting the bathroom  Transfers Overall transfer level: Independent               General transfer comment: educated on seating  Ambulation/Gait Ambulation/Gait assistance: Modified independent (Device/Increase time) Ambulation Distance (Feet): 400 Feet Assistive device: None Gait Pattern/deviations: WFL(Within Functional Limits) Gait velocity: Slightly decreased   General Gait Details: Pt mobilizing well.   Stairs Stairs: Yes Stairs assistance: Modified independent (Device/Increase time) Stair Management: One rail Left;Step to pattern Number of Stairs: 10 General stair comments: VC's for sequencing and general safety. Advised leading with  RLE during ascending due to history of LLE weakness  Wheelchair Mobility    Modified Rankin (Stroke Patients Only)       Balance Overall balance assessment: No apparent balance deficits (not formally assessed)                                           Pertinent Vitals/Pain Pain Assessment: No/denies pain    Home Living Family/patient expects to be discharged to:: Private residence Living Arrangements: Spouse/significant other Available Help at Discharge: Family;Available 24 hours/day Type of Home: House Home Access: Level entry     Home Layout: Two level Home Equipment: None      Prior Function Level of Independence: Independent               Hand Dominance   Dominant Hand: Right    Extremity/Trunk Assessment   Upper Extremity Assessment: Overall WFL for tasks assessed           Lower Extremity Assessment: Overall WFL for tasks assessed      Cervical / Trunk Assessment: Other exceptions  Communication   Communication: No difficulties  Cognition Arousal/Alertness: Awake/alert Behavior During Therapy: WFL for tasks assessed/performed Overall Cognitive Status: Within Functional Limits for tasks assessed                      General Comments      Exercises     Assessment/Plan    PT Assessment Patent does not need any further PT services  PT Problem List  PT Treatment Interventions      PT Goals (Current goals can be found in the Care Plan section)  Acute Rehab PT Goals PT Goal Formulation: All assessment and education complete, DC therapy    Frequency     Barriers to discharge        Co-evaluation               End of Session   Activity Tolerance: Patient tolerated treatment well Patient left: in bed;with call bell/phone within reach;with family/visitor present Nurse Communication: Mobility status         Time: [id number] PT Time Calculation (min) (ACUTE ONLY): 13  min   Charges:   PT Evaluation $PT Eval Moderate Complexity: 1 Procedure     PT G Codes:        Thelma Comp 30-Sep-2016, 11:25 AM   Rolinda Roan, PT, DPT Acute Rehabilitation Services Pager: [phone]

## 2016-09-16 NOTE — Progress Notes (Signed)
Patient alert and oriented, mae's well, voiding adequate amount of urine, swallowing without difficulty, no c/o pain at time of discharge. Patient discharged home with family. Script and discharged instructions given to patient. Patient and family stated understanding of instructions given. Patient has an appointment with Dr. Jones °

## 2016-09-16 NOTE — Discharge Instructions (Signed)
Wound Care °Leave incision open to air. °You may shower. °Do not scrub directly on incision.  °Leave steri-strips on incision.  They will fall off by themselves. °Do not put any creams, lotions, or ointments on incision. °Activity °Walk each and every day, increasing distance each day. °No lifting greater than 5 lbs.  Avoid bending, arching, and twisting. °No driving for 2 weeks; may ride as a passenger locally. °If provided with back brace, wear when out of bed.  It is not necessary to wear in bed. °Diet °Resume your normal diet.  °Return to Work °Will be discussed at you follow up appointment. °Call Your Doctor If Any of These Occur °Redness, drainage, or swelling at the wound.  °Temperature greater than 101 degrees. °Severe pain not relieved by pain medication. °Incision starts to come apart. °Follow Up Appt °Call today for appointment in 3-4 weeks ([phone]) or for problems.  If you have any hardware placed in your spine, you will need an x-ray before your appointment. ° °
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
## 2016-09-30 NOTE — Progress Notes (Signed)
OT NOTE- LATE GCODE ENTRY    09/16/16 0800  OT G-codes **NOT FOR INPATIENT CLASS**  Functional Assessment Tool Used clinical judgement  Functional Limitation Self care  Self Care Current Status [phone]) CI  Self Care Goal Status [id number]) [facility]  Self Care Discharge Status [phone]) CI          Jeri Modena   OTR/L Pager: [phone] Office: [phone] .

## 2016-10-01 DIAGNOSIS — E782 Mixed hyperlipidemia: Secondary | ICD-10-CM | POA: Diagnosis not present

## 2016-10-01 DIAGNOSIS — R7301 Impaired fasting glucose: Secondary | ICD-10-CM | POA: Diagnosis not present

## 2016-10-05 NOTE — Progress Notes (Signed)
Physical Therapy Evaluation Addendum for G-Codes    September 28, 2016 1009  PT G-Codes **NOT FOR INPATIENT CLASS**  Functional Assessment Tool Used Clinical judgement  Functional Limitation Mobility: Walking and moving around  Mobility: Walking and Moving Around Current Status [id number]) CI  Mobility: Walking and Moving Around Goal Status [phone]) CI  Mobility: Walking and Moving Around Discharge Status [phone]) CI   Rolinda Roan, PT, DPT Acute Rehabilitation Services Pager: [phone]

## 2016-10-08 DIAGNOSIS — R7303 Prediabetes: Secondary | ICD-10-CM | POA: Diagnosis not present

## 2016-10-08 DIAGNOSIS — Z6831 Body mass index (BMI) 31.0-31.9, adult: Secondary | ICD-10-CM | POA: Diagnosis not present

## 2016-10-08 DIAGNOSIS — R030 Elevated blood-pressure reading, without diagnosis of hypertension: Secondary | ICD-10-CM | POA: Diagnosis not present

## 2016-10-08 DIAGNOSIS — E782 Mixed hyperlipidemia: Secondary | ICD-10-CM | POA: Diagnosis not present

## 2016-10-20 DIAGNOSIS — M6281 Muscle weakness (generalized): Secondary | ICD-10-CM | POA: Diagnosis not present

## 2016-10-20 DIAGNOSIS — M5116 Intervertebral disc disorders with radiculopathy, lumbar region: Secondary | ICD-10-CM | POA: Diagnosis not present

## 2016-10-27 DIAGNOSIS — M6281 Muscle weakness (generalized): Secondary | ICD-10-CM | POA: Diagnosis not present

## 2016-10-27 DIAGNOSIS — M5116 Intervertebral disc disorders with radiculopathy, lumbar region: Secondary | ICD-10-CM | POA: Diagnosis not present

## 2016-10-29 DIAGNOSIS — M6281 Muscle weakness (generalized): Secondary | ICD-10-CM | POA: Diagnosis not present

## 2016-10-29 DIAGNOSIS — M5116 Intervertebral disc disorders with radiculopathy, lumbar region: Secondary | ICD-10-CM | POA: Diagnosis not present

## 2016-11-04 DIAGNOSIS — M6281 Muscle weakness (generalized): Secondary | ICD-10-CM | POA: Diagnosis not present

## 2016-11-04 DIAGNOSIS — M5116 Intervertebral disc disorders with radiculopathy, lumbar region: Secondary | ICD-10-CM | POA: Diagnosis not present

## 2016-11-06 DIAGNOSIS — M6281 Muscle weakness (generalized): Secondary | ICD-10-CM | POA: Diagnosis not present

## 2016-11-06 DIAGNOSIS — M5116 Intervertebral disc disorders with radiculopathy, lumbar region: Secondary | ICD-10-CM | POA: Diagnosis not present

## 2016-11-10 DIAGNOSIS — M6281 Muscle weakness (generalized): Secondary | ICD-10-CM | POA: Diagnosis not present

## 2016-11-10 DIAGNOSIS — M5116 Intervertebral disc disorders with radiculopathy, lumbar region: Secondary | ICD-10-CM | POA: Diagnosis not present

## 2016-11-12 DIAGNOSIS — M5116 Intervertebral disc disorders with radiculopathy, lumbar region: Secondary | ICD-10-CM | POA: Diagnosis not present

## 2016-11-12 DIAGNOSIS — M6281 Muscle weakness (generalized): Secondary | ICD-10-CM | POA: Diagnosis not present

## 2016-11-17 DIAGNOSIS — M5116 Intervertebral disc disorders with radiculopathy, lumbar region: Secondary | ICD-10-CM | POA: Diagnosis not present

## 2016-11-17 DIAGNOSIS — M6281 Muscle weakness (generalized): Secondary | ICD-10-CM | POA: Diagnosis not present

## 2016-11-24 DIAGNOSIS — M5116 Intervertebral disc disorders with radiculopathy, lumbar region: Secondary | ICD-10-CM | POA: Diagnosis not present

## 2016-11-24 DIAGNOSIS — M6281 Muscle weakness (generalized): Secondary | ICD-10-CM | POA: Diagnosis not present

## 2016-11-25 ENCOUNTER — Ambulatory Visit (INDEPENDENT_AMBULATORY_CARE_PROVIDER_SITE_OTHER): Payer: Medicare Other | Admitting: Neurology

## 2016-11-25 ENCOUNTER — Encounter: Payer: Self-pay | Admitting: Neurology

## 2016-11-25 VITALS — BP 136/84 | HR 80 | Ht 70.5 in | Wt 214.7 lb

## 2016-11-25 DIAGNOSIS — G3184 Mild cognitive impairment, so stated: Secondary | ICD-10-CM | POA: Diagnosis not present

## 2016-11-25 DIAGNOSIS — R55 Syncope and collapse: Secondary | ICD-10-CM | POA: Diagnosis not present

## 2016-11-25 DIAGNOSIS — S0990XD Unspecified injury of head, subsequent encounter: Secondary | ICD-10-CM

## 2016-11-25 NOTE — Patient Instructions (Signed)
1.  We will check MRI of brain  2.  We will refer you to neurocognitive testing. 3.  Follow up after testing
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
## 2016-11-26 DIAGNOSIS — M48061 Spinal stenosis, lumbar region without neurogenic claudication: Secondary | ICD-10-CM | POA: Diagnosis not present

## 2016-11-26 DIAGNOSIS — M5416 Radiculopathy, lumbar region: Secondary | ICD-10-CM | POA: Diagnosis not present

## 2016-11-26 DIAGNOSIS — M6281 Muscle weakness (generalized): Secondary | ICD-10-CM | POA: Diagnosis not present

## 2016-11-26 DIAGNOSIS — M5116 Intervertebral disc disorders with radiculopathy, lumbar region: Secondary | ICD-10-CM | POA: Diagnosis not present

## 2016-11-27 ENCOUNTER — Ambulatory Visit
Admission: RE | Admit: 2016-11-27 | Discharge: 2016-11-27 | Disposition: A | Discharge status: Home / Self Care | Payer: Medicare Other | Source: Ambulatory Visit | Attending: Neurology | Admitting: Neurology

## 2016-11-27 DIAGNOSIS — G3184 Mild cognitive impairment, so stated: Secondary | ICD-10-CM

## 2016-11-27 DIAGNOSIS — S0990XA Unspecified injury of head, initial encounter: Secondary | ICD-10-CM | POA: Diagnosis not present

## 2016-11-27 DIAGNOSIS — S0990XD Unspecified injury of head, subsequent encounter: Secondary | ICD-10-CM

## 2016-11-30 ENCOUNTER — Telehealth: Payer: Self-pay | Admitting: *Deleted

## 2016-11-30 NOTE — Telephone Encounter (Signed)
Patient notified of results.

## 2016-11-30 NOTE — Telephone Encounter (Signed)
-----   Message from Pieter Partridge, DO sent at 11/30/2016  7:02 AM EST ----- MRI of brain is overall unremarkable.  There is evidence of mild age-related changes but nothing serious or concerning.

## 2016-12-01 DIAGNOSIS — M6281 Muscle weakness (generalized): Secondary | ICD-10-CM | POA: Diagnosis not present

## 2016-12-01 DIAGNOSIS — M5116 Intervertebral disc disorders with radiculopathy, lumbar region: Secondary | ICD-10-CM | POA: Diagnosis not present

## 2016-12-15 DIAGNOSIS — M5416 Radiculopathy, lumbar region: Secondary | ICD-10-CM | POA: Diagnosis not present

## 2016-12-15 DIAGNOSIS — R030 Elevated blood-pressure reading, without diagnosis of hypertension: Secondary | ICD-10-CM | POA: Diagnosis not present

## 2016-12-15 DIAGNOSIS — R7301 Impaired fasting glucose: Secondary | ICD-10-CM | POA: Diagnosis not present

## 2016-12-15 DIAGNOSIS — Z6830 Body mass index (BMI) 30.0-30.9, adult: Secondary | ICD-10-CM | POA: Diagnosis not present

## 2016-12-15 DIAGNOSIS — E782 Mixed hyperlipidemia: Secondary | ICD-10-CM | POA: Diagnosis not present

## 2016-12-21 DIAGNOSIS — Z0000 Encounter for general adult medical examination without abnormal findings: Secondary | ICD-10-CM | POA: Diagnosis not present

## 2016-12-21 DIAGNOSIS — E782 Mixed hyperlipidemia: Secondary | ICD-10-CM | POA: Diagnosis not present

## 2016-12-21 DIAGNOSIS — R7303 Prediabetes: Secondary | ICD-10-CM | POA: Diagnosis not present

## 2016-12-21 DIAGNOSIS — F418 Other specified anxiety disorders: Secondary | ICD-10-CM | POA: Diagnosis not present

## 2016-12-21 DIAGNOSIS — G3184 Mild cognitive impairment, so stated: Secondary | ICD-10-CM | POA: Diagnosis not present

## 2016-12-29 ENCOUNTER — Ambulatory Visit (INDEPENDENT_AMBULATORY_CARE_PROVIDER_SITE_OTHER): Payer: Medicare Other | Admitting: Psychology

## 2016-12-29 ENCOUNTER — Encounter: Payer: Self-pay | Admitting: Psychology

## 2016-12-29 DIAGNOSIS — R413 Other amnesia: Secondary | ICD-10-CM

## 2016-12-29 DIAGNOSIS — S0990XD Unspecified injury of head, subsequent encounter: Secondary | ICD-10-CM | POA: Diagnosis not present
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
## 2017-01-11 ENCOUNTER — Ambulatory Visit (INDEPENDENT_AMBULATORY_CARE_PROVIDER_SITE_OTHER): Payer: Medicare Other | Admitting: Psychology

## 2017-01-11 DIAGNOSIS — S0990XD Unspecified injury of head, subsequent encounter: Secondary | ICD-10-CM | POA: Diagnosis not present

## 2017-01-11 NOTE — Progress Notes (Signed)
   Neuropsychology Note  Caleb Woodward returned today for 2 hours of neuropsychological testing with technician, Milana Kidney, BS, under the supervision of Dr. Macarthur Critchley. The patient did not appear overtly distressed by the testing session, per behavioral observation or via self-report to the technician. Rest breaks were offered. Caleb Woodward will return within 2 weeks for a feedback session with Dr. Si Raider at which time his test performances, clinical impressions and treatment recommendations will be reviewed in detail. The patient understands he can contact our office should he require our assistance before this time.  Full report to follow.

## 2017-01-18 ENCOUNTER — Telehealth: Payer: Self-pay | Admitting: Neurology

## 2017-01-18 DIAGNOSIS — M792 Neuralgia and neuritis, unspecified: Secondary | ICD-10-CM

## 2017-01-18 NOTE — Telephone Encounter (Signed)
Spoke to spouse.  Gave recommendations per Dr. Amparo Bristol previous note. Spouse states neurosurgeon has told them the surgery site looks well and there was nothing else they can do. Spse says she is very upset and frustrated that no one can help her husband. Reports pt does not want to take pain meds and does not want to be referred to a pain specialist. Spouse says she is a retired Therapist, sports and she knows there is something going on with the nerves in the pt's legs. Requested Dr. Tomi Likens is informed when he returns of patient's condition and would like to know his recommendation.

## 2017-01-18 NOTE — Telephone Encounter (Signed)
It appears he has a neurosurgeon for this issue, Dr. Tomi Likens has not been seeing him for this. Would recommend calling his neurosurgeon, thanks

## 2017-01-18 NOTE — Telephone Encounter (Signed)
Caleb Woodward 05/25/1946. He needs a refill on Gabapentin 600 mg. He uses [facility] in [place]. His wife called [phone]. She said he is in a lot of pain and had a pain tolerance of over 10 this weekend and can barely walk. I did make him a follow up appointment for May and he is on a wait list as well high priority. Is there any way he can be seen sooner? Thank you

## 2017-01-18 NOTE — Telephone Encounter (Signed)
Spoke to spouse Hilda Blades. Pt had increase pain in legs over the weekend. Pt rated pain 10+/10. Spouse offered to take to ED; pt refused. Pt took Gabapentin and muscle relaxer. Seemed to ease pain to 8 or 9/10. Requesting refill on Gabapentin and med increase if possible. Has an appt w/ Dr. Tomi Likens on 06/04.
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
## 2017-01-24 NOTE — Telephone Encounter (Signed)
I have never seen this patient for his leg pain (I saw him for his memory problems and falls).  He should get refills/change in gabapentin from prescribing provider (it was not prescribed by me).  This should be addressed by his PCP until he sees me.

## 2017-01-25 ENCOUNTER — Encounter: Payer: Self-pay | Admitting: Psychology

## 2017-01-25 ENCOUNTER — Ambulatory Visit (INDEPENDENT_AMBULATORY_CARE_PROVIDER_SITE_OTHER): Payer: Medicare Other | Admitting: Psychology

## 2017-01-25 DIAGNOSIS — G3184 Mild cognitive impairment, so stated: Secondary | ICD-10-CM

## 2017-01-25 DIAGNOSIS — S0990XD Unspecified injury of head, subsequent encounter: Secondary | ICD-10-CM | POA: Diagnosis not present

## 2017-01-26 NOTE — Telephone Encounter (Signed)
We should try getting him in sooner to discuss/evaluate.  In the meantime, let's get NCV-EMG of the lower extremities (to assess bilateral lower extremity pain).

## 2017-01-26 NOTE — Telephone Encounter (Signed)
Spoke with patient's wife and made her aware.   EMG order placed. Theadora Rama- can you call patient's wife with EMG appt 90 minute bilateral legs and put his follow up appt on a cancellation list? Thanks so much!

## 2017-01-28 ENCOUNTER — Ambulatory Visit (INDEPENDENT_AMBULATORY_CARE_PROVIDER_SITE_OTHER): Payer: Medicare Other | Admitting: Neurology

## 2017-01-28 DIAGNOSIS — M792 Neuralgia and neuritis, unspecified: Secondary | ICD-10-CM

## 2017-01-28 DIAGNOSIS — M5417 Radiculopathy, lumbosacral region: Secondary | ICD-10-CM

## 2017-01-28 NOTE — Procedures (Signed)
[facility]  [address]  [place], [place] [address] Tel: [phone] Fax:  [phone] Test Date:  01/28/2017  Patient: Caleb Woodward DOB: 1945-11-18 Physician: Narda Amber, DO  Sex: Male Height: 5\' 10"  Ref Phys: Metta Clines, D.O.  ID#: [id number] Temp: 35.6 Technician:    Patient Complaints: This is a 71 year-old man with history of lumbar decompression at left L4-5 and L5-S1 referred for evaluation of left radicular pain.  NCV & EMG Findings: Extensive electrodiagnostic testing of the left lower extremity and additional studies of the right shows:  1. Bilateral sural and superficial peroneal sensory responses are within normal limits. 2. Left peroneal motor response at the extensor digitorum brevis is reduced, and normal at tibialis anterior. Bilateral tibial and right peroneal motor responses are within normal limits. 3. Bilateral tibial H reflex studies are within normal limits. 4. Chronic motor axon loss changes are seen affecting predominantly L5 myotomes bilaterally and worse on the left. There is no evidence of accompanied active denervation.  Impression: 1. Chronic L5 radiculopathy affecting the lower extremities, moderate in degree electrically and worse on the left. 2. There is no evidence of a generalized sensorimotor polyneuropathy affecting the lower extremities.   ___________________________ Narda Amber, DO    Nerve Conduction Studies Anti Sensory Summary Table   Site NR Peak (ms) Norm Peak (ms) P-T Amp (V) Norm P-T Amp  Left Sup Peroneal Anti Sensory (Ant Lat Mall)  12 cm    3.7 <4.6 7.1 >3  Right Sup Peroneal Anti Sensory (Ant Lat Mall)  12 cm    3.8 <4.6 8.1 >3  Left Sural Anti Sensory (Lat Mall)  Calf    4.3 <4.6 4.8 >3  Right Sural Anti Sensory (Lat Mall)  Calf    3.8 <4.6 5.5 >3   Motor Summary Table   Site NR Onset (ms) Norm Onset (ms) O-P Amp (mV) Norm O-P Amp Site1 Site2 Delta-0 (ms) Dist (cm) Vel (m/s) Norm Vel (m/s)    Left Peroneal Motor (Ext Dig Brev)  Ankle    5.1 <6.0 2.2 >2.5 B Fib Ankle 8.0 33.0 41 >40  B Fib    13.1  2.0  Poplt B Fib 1.7 9.0 53 >40  Poplt    14.8  2.0         Right Peroneal Motor (Ext Dig Brev)  Ankle    4.3 <6.0 2.8 >2.5 B Fib Ankle 8.0 36.0 45 >40  B Fib    12.3  2.6  Poplt B Fib 2.2 9.0 41 >40  Poplt    14.5  2.4         Left Peroneal TA Motor (Tib Ant)  Fib Head    3.3 <4.5 4.8 >3 Poplit Fib Head 1.5 9.0 60 >40  Poplit    4.8  4.9         Left Tibial Motor (Abd Hall Brev)  Ankle    5.2 <6.0 8.1 >4 Knee Ankle 8.9 39.0 44 >40  Knee    14.1  4.6         Right Tibial Motor (Abd Hall Brev)  Ankle    5.5 <6.0 8.8 >4 Knee Ankle 9.4 38.0 40 >40  Knee    14.9  5.9          H Reflex Studies   NR H-Lat (ms) Lat Norm (ms) L-R H-Lat (ms)  Left Tibial (Gastroc)     35.65 <35 0.96  Right Tibial (Gastroc)  34.69 <35 0.96   EMG   Side Muscle Ins Act Fibs Psw Fasc Number Recrt Dur Dur. Amp Amp. Poly Poly. Comment  Left Flex Dig Long Nml Nml Nml Nml 2- Rapid Many 1+ Many 1+ Nml Nml N/A  Left AntTibialis Nml Nml Nml Nml 2- Rapid Most 1+ Most 1+ Many 1+ N/A  Left Gastroc Nml Nml Nml Nml Nml Nml Nml Nml Nml Nml Nml Nml N/A  Left RectFemoris Nml Nml Nml Nml Nml Nml Nml Nml Nml Nml Nml Nml N/A  Left BicepsFemS Nml Nml Nml Nml Nml Nml Nml Nml Nml Nml Nml Nml N/A  Left GluteusMed Nml Nml Nml Nml 1- Rapid Some 1+ Some 1+ Nml Nml N/A  Right Gastroc Nml Nml Nml Nml Nml Nml Nml Nml Nml Nml Nml Nml N/A  Right GluteusMed Nml Nml Nml Nml 1- Rapid Some 1+ Some 1+ Nml Nml N/A  Right AntTibialis Nml Nml Nml Nml 2- Rapid Some 1+ Some 1+ Nml Nml N/A      Waveforms:

## 2017-02-01 ENCOUNTER — Telehealth: Payer: Self-pay

## 2017-02-01 NOTE — Telephone Encounter (Signed)
Called patient to give nerve study results. No answer. Left vmail

## 2017-02-01 NOTE — Telephone Encounter (Signed)
-----   Message from Pieter Partridge, DO sent at 01/31/2017  2:52 PM EDT ----- Nerve study shows evidence of old pinched nerves from back.  No other nerve problems

## 2017-02-03 NOTE — Telephone Encounter (Signed)
Spoke to spouse. Gave nerve study results. Spse verbalized understanding. Reports pt still 7/8 on pain scale. Gabapentin 600mg  tid gives little alleviation. Advised per Dr. Tomi Likens could refer to pain specialist. Spouse said will hold off until she speaks with patient. Not sure he would like to be referred to pain specialist. Confirmed would keep follow up appt w/ Dr. Tomi Likens in June.

## 2017-02-17 DIAGNOSIS — R413 Other amnesia: Secondary | ICD-10-CM | POA: Diagnosis not present

## 2017-02-18 DIAGNOSIS — S0990XS Unspecified injury of head, sequela: Secondary | ICD-10-CM | POA: Diagnosis not present

## 2017-02-18 DIAGNOSIS — F068 Other specified mental disorders due to known physiological condition: Secondary | ICD-10-CM | POA: Diagnosis not present

## 2017-02-18 DIAGNOSIS — G8929 Other chronic pain: Secondary | ICD-10-CM | POA: Diagnosis not present

## 2017-02-18 DIAGNOSIS — Z6832 Body mass index (BMI) 32.0-32.9, adult: Secondary | ICD-10-CM | POA: Diagnosis not present

## 2017-02-26 DIAGNOSIS — H2513 Age-related nuclear cataract, bilateral: Secondary | ICD-10-CM | POA: Diagnosis not present

## 2017-02-26 DIAGNOSIS — H524 Presbyopia: Secondary | ICD-10-CM | POA: Diagnosis not present

## 2017-03-10 ENCOUNTER — Encounter: Payer: Self-pay | Admitting: Neurology

## 2017-03-10 ENCOUNTER — Ambulatory Visit (INDEPENDENT_AMBULATORY_CARE_PROVIDER_SITE_OTHER): Payer: Medicare Other | Admitting: Neurology

## 2017-03-10 VITALS — BP 140/70 | HR 78 | Temp 98.1°F | Wt 227.0 lb

## 2017-03-10 DIAGNOSIS — G4752 REM sleep behavior disorder: Secondary | ICD-10-CM | POA: Diagnosis not present

## 2017-03-10 DIAGNOSIS — S0990XD Unspecified injury of head, subsequent encounter: Secondary | ICD-10-CM | POA: Diagnosis not present

## 2017-03-10 DIAGNOSIS — M5417 Radiculopathy, lumbosacral region: Secondary | ICD-10-CM

## 2017-03-10 DIAGNOSIS — G3184 Mild cognitive impairment, so stated: Secondary | ICD-10-CM

## 2017-03-10 MED ORDER — GABAPENTIN 300 MG PO CAPS: 900.0000 mg | ORAL_CAPSULE | Freq: Three times a day (TID) | ORAL | 3 refills | Status: DC
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
## 2017-03-10 NOTE — Progress Notes (Signed)
Office note routed to PCP.

## 2017-03-10 NOTE — Patient Instructions (Signed)
1.  We will refer you to pain management.  In the meantime, we will increase gabapentin to 900mg  three times daily.  Continue Motrin in between and the Flexeril at night. 2.  We will refer you to Sleep Medicine for the sleep disturbance 3.  I want you to start a Mediterranean diet  Mediterranean Diet A Mediterranean diet refers to food and lifestyle choices that are based on the traditions of countries located on the [employer]. This way of eating has been shown to help prevent certain conditions and improve outcomes for people who have chronic diseases, like kidney disease and heart disease. What are tips for following this plan? Lifestyle   Cook and eat meals together with your family, when possible.  Drink enough fluid to keep your urine clear or pale yellow.  Be physically active every day. This includes:  Aerobic exercise like running or swimming.  Leisure activities like gardening, walking, or housework.  Get 7-8 hours of sleep each night.  If recommended by your health care provider, drink red wine in moderation. This means 1 glass a day for nonpregnant women and 2 glasses a day for men. A glass of wine equals 5 oz (150 mL). Reading food labels   Check the serving size of packaged foods. For foods such as rice and pasta, the serving size refers to the amount of cooked product, not dry.  Check the total fat in packaged foods. Avoid foods that have saturated fat or trans fats.  Check the ingredients list for added sugars, such as corn syrup. Shopping   At the grocery store, buy most of your food from the areas near the walls of the store. This includes:  Fresh fruits and vegetables (produce).  Grains, beans, nuts, and seeds. Some of these may be available in unpackaged forms or large amounts (in bulk).  Fresh seafood.  Poultry and eggs.  Low-fat dairy products.  Buy whole ingredients instead of prepackaged foods.  Buy fresh fruits and vegetables in-season from  local farmers markets.  Buy frozen fruits and vegetables in resealable bags.  If you do not have access to quality fresh seafood, buy precooked frozen shrimp or canned fish, such as tuna, salmon, or sardines.  Buy small amounts of raw or cooked vegetables, salads, or olives from the deli or salad bar at your store.  Stock your pantry so you always have certain foods on hand, such as olive oil, canned tuna, canned tomatoes, rice, pasta, and beans. Cooking   Cook foods with extra-virgin olive oil instead of using butter or other vegetable oils.  Have meat as a side dish, and have vegetables or grains as your main dish. This means having meat in small portions or adding small amounts of meat to foods like pasta or stew.  Use beans or vegetables instead of meat in common dishes like chili or lasagna.  Experiment with different cooking methods. Try roasting or broiling vegetables instead of steaming or sauteing them.  Add frozen vegetables to soups, stews, pasta, or rice.  Add nuts or seeds for added healthy fat at each meal. You can add these to yogurt, salads, or vegetable dishes.  Marinate fish or vegetables using olive oil, lemon juice, garlic, and fresh herbs. Meal planning   Plan to eat 1 vegetarian meal one day each week. Try to work up to 2 vegetarian meals, if possible.  Eat seafood 2 or more times a week.  Have healthy snacks readily available, such as:  Vegetable sticks  with hummus.  Greek yogurt.  Fruit and nut trail mix.  Eat balanced meals throughout the week. This includes:  Fruit: 2-3 servings a day  Vegetables: 4-5 servings a day  Low-fat dairy: 2 servings a day  Fish, poultry, or lean meat: 1 serving a day  Beans and legumes: 2 or more servings a week  Nuts and seeds: 1-2 servings a day  Whole grains: 6-8 servings a day  Extra-virgin olive oil: 3-4 servings a day  Limit red meat and sweets to only a few servings a month What are my food  choices?  Mediterranean diet  Recommended  Grains: Whole-grain pasta. Brown rice. Bulgar wheat. Polenta. Couscous. Whole-wheat bread. [place].  Vegetables: Artichokes. Beets. Broccoli. Cabbage. Carrots. Eggplant. Green beans. Chard. Kale. Spinach. Onions. Leeks. Peas. Squash. Tomatoes. Peppers. Radishes.  Fruits: Apples. Apricots. Avocado. Berries. Bananas. Cherries. Dates. Figs. Grapes. Lemons. Melon. Oranges. Peaches. Plums. Pomegranate.  Meats and other protein foods: Beans. Almonds. Sunflower seeds. Pine nuts. Peanuts. [place]. Salmon. Scallops. Shrimp. [place]. Tilapia. Clams. Oysters. Eggs.  Dairy: Low-fat milk. Cheese. Greek yogurt.  Beverages: Water. Red wine. Herbal tea.  Fats and oils: Extra virgin olive oil. Avocado oil. Grape seed oil.  Sweets and desserts: [place] yogurt with honey. Baked apples. Poached pears. Trail mix.  Seasoning and other foods: Basil. Cilantro. Coriander. Cumin. Mint. Parsley. Sage. Rosemary. Tarragon. Garlic. Oregano. Thyme. Pepper. Balsalmic vinegar. Tahini. Hummus. Tomato sauce. Olives. Mushrooms.  Limit these  Grains: Prepackaged pasta or rice dishes. Prepackaged cereal with added sugar.  Vegetables: Deep fried potatoes (french fries).  Fruits: Fruit canned in syrup.  Meats and other protein foods: Beef. Pork. Lamb. Poultry with skin. Hot dogs. Berniece Salines.  Dairy: Ice cream. Sour cream. Whole milk.  Beverages: Juice. Sugar-sweetened soft drinks. Beer. Liquor and spirits.  Fats and oils: Butter. Canola oil. Vegetable oil. Beef fat (tallow). Lard.  Sweets and desserts: Cookies. Cakes. Pies. Candy.  Seasoning and other foods: Mayonnaise. Premade sauces and marinades.  The items listed may not be a complete list. Talk with your dietitian about what dietary choices are right for you. Summary  The Mediterranean diet includes both food and lifestyle choices.  Eat a variety of fresh fruits and vegetables, beans, nuts, seeds, and whole  grains.  Limit the amount of red meat and sweets that you eat.  Talk with your health care provider about whether it is safe for you to drink red wine in moderation. This means 1 glass a day for nonpregnant women and 2 glasses a day for men. A glass of wine equals 5 oz (150 mL). This information is not intended to replace advice given to you by your health care provider. Make sure you discuss any questions you have with your health care provider. Document Released: 06/11/2016 Document Revised: 07/14/2016 Document Reviewed: 06/11/2016 Elsevier Interactive Patient Education  2017 [place]. 4.  Follow up in 4 months.

## 2017-03-25 ENCOUNTER — Other Ambulatory Visit: Payer: Self-pay | Admitting: Emergency Medicine

## 2017-03-25 ENCOUNTER — Telehealth: Payer: Self-pay | Admitting: Neurology

## 2017-03-25 DIAGNOSIS — G4752 REM sleep behavior disorder: Secondary | ICD-10-CM

## 2017-03-25 NOTE — Telephone Encounter (Signed)
Patient's wife Hilda Blades) called in regarding a referral to [facility] and also waiting to get a referral on a sleep study. Please call. Thanks

## 2017-03-25 NOTE — Telephone Encounter (Signed)
Called [facility] to check status of sleep study referral, they informed me that patients wife declined an appointment because it would be $200 upfront. Spoke to patients wife about this and she would like to be referred somewhere they do not have to pay out of pocket. I will try [facility] but cannot guarantee any out of pocket cost patient may or may not incur.   No answer at pain management office. Will refer to preferred pain clinic. Patients wife is adamant that the patient really needs to be seen ASAP due to pain.

## 2017-04-01 ENCOUNTER — Telehealth: Payer: Self-pay | Admitting: Neurology

## 2017-04-01 NOTE — Telephone Encounter (Signed)
Called [facility] and left a message for them to call me back about the referral that was made on May 9.  Patient's wife notified.

## 2017-04-01 NOTE — Telephone Encounter (Signed)
PT's wife Hilda Blades called and said she has not heard from the pain clinic that Dr Tomi Likens referred her husband to

## 2017-04-05 ENCOUNTER — Ambulatory Visit: Payer: PRIVATE HEALTH INSURANCE | Admitting: Neurology

## 2017-05-10 DIAGNOSIS — E782 Mixed hyperlipidemia: Secondary | ICD-10-CM | POA: Diagnosis not present

## 2017-05-10 DIAGNOSIS — R7301 Impaired fasting glucose: Secondary | ICD-10-CM | POA: Diagnosis not present

## 2017-05-13 ENCOUNTER — Encounter: Payer: Self-pay | Admitting: Physical Medicine & Rehabilitation

## 2017-05-13 ENCOUNTER — Encounter: Payer: Medicare Other | Attending: Physical Medicine & Rehabilitation | Admitting: Physical Medicine & Rehabilitation

## 2017-05-13 VITALS — BP 163/84 | HR 71

## 2017-05-13 DIAGNOSIS — R202 Paresthesia of skin: Secondary | ICD-10-CM | POA: Insufficient documentation

## 2017-05-13 DIAGNOSIS — G8929 Other chronic pain: Secondary | ICD-10-CM

## 2017-05-13 DIAGNOSIS — E7800 Pure hypercholesterolemia, unspecified: Secondary | ICD-10-CM | POA: Diagnosis not present

## 2017-05-13 DIAGNOSIS — Z8546 Personal history of malignant neoplasm of prostate: Secondary | ICD-10-CM | POA: Diagnosis not present

## 2017-05-13 DIAGNOSIS — K219 Gastro-esophageal reflux disease without esophagitis: Secondary | ICD-10-CM | POA: Diagnosis not present

## 2017-05-13 DIAGNOSIS — G479 Sleep disorder, unspecified: Secondary | ICD-10-CM

## 2017-05-13 DIAGNOSIS — R200 Anesthesia of skin: Secondary | ICD-10-CM | POA: Diagnosis not present

## 2017-05-13 DIAGNOSIS — M5441 Lumbago with sciatica, right side: Secondary | ICD-10-CM | POA: Diagnosis not present

## 2017-05-13 DIAGNOSIS — Z981 Arthrodesis status: Secondary | ICD-10-CM | POA: Diagnosis not present

## 2017-05-13 DIAGNOSIS — R269 Unspecified abnormalities of gait and mobility: Secondary | ICD-10-CM

## 2017-05-13 DIAGNOSIS — G894 Chronic pain syndrome: Secondary | ICD-10-CM | POA: Diagnosis not present

## 2017-05-13 DIAGNOSIS — Z9181 History of falling: Secondary | ICD-10-CM | POA: Insufficient documentation

## 2017-05-13 DIAGNOSIS — M791 Myalgia, unspecified site: Secondary | ICD-10-CM

## 2017-05-13 DIAGNOSIS — Z833 Family history of diabetes mellitus: Secondary | ICD-10-CM | POA: Diagnosis not present

## 2017-05-13 DIAGNOSIS — M545 Low back pain: Secondary | ICD-10-CM | POA: Insufficient documentation

## 2017-05-13 MED ORDER — DULOXETINE HCL 30 MG PO CPEP: 30.0000 mg | ORAL_CAPSULE | Freq: Every day | ORAL | 1 refills | Status: DC

## 2017-05-13 MED ORDER — METHOCARBAMOL 500 MG PO TABS: 500.0000 mg | ORAL_TABLET | Freq: Three times a day (TID) | ORAL | 1 refills | Status: DC | PRN

## 2017-05-13 MED ORDER — AMITRIPTYLINE HCL 10 MG PO TABS: 10.0000 mg | ORAL_TABLET | Freq: Every day | ORAL | 1 refills | Status: DC

## 2017-05-13 MED ORDER — MELOXICAM 15 MG PO TABS: 15.0000 mg | ORAL_TABLET | Freq: Every day | ORAL | 1 refills | Status: DC
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
## 2017-05-18 DIAGNOSIS — F3160 Bipolar disorder, current episode mixed, unspecified: Secondary | ICD-10-CM | POA: Diagnosis not present

## 2017-05-18 DIAGNOSIS — I10 Essential (primary) hypertension: Secondary | ICD-10-CM | POA: Diagnosis not present

## 2017-05-19 ENCOUNTER — Telehealth: Payer: Self-pay

## 2017-05-19 DIAGNOSIS — M5441 Lumbago with sciatica, right side: Principal | ICD-10-CM

## 2017-05-19 DIAGNOSIS — R269 Unspecified abnormalities of gait and mobility: Secondary | ICD-10-CM

## 2017-05-19 DIAGNOSIS — E663 Overweight: Secondary | ICD-10-CM | POA: Diagnosis not present

## 2017-05-19 DIAGNOSIS — Z1389 Encounter for screening for other disorder: Secondary | ICD-10-CM | POA: Diagnosis not present

## 2017-05-19 DIAGNOSIS — G8929 Other chronic pain: Secondary | ICD-10-CM

## 2017-05-19 DIAGNOSIS — Z139 Encounter for screening, unspecified: Secondary | ICD-10-CM | POA: Diagnosis not present

## 2017-05-19 DIAGNOSIS — G894 Chronic pain syndrome: Secondary | ICD-10-CM

## 2017-05-19 DIAGNOSIS — E782 Mixed hyperlipidemia: Secondary | ICD-10-CM | POA: Diagnosis not present

## 2017-05-19 DIAGNOSIS — R7303 Prediabetes: Secondary | ICD-10-CM | POA: Diagnosis not present

## 2017-05-19 DIAGNOSIS — F418 Other specified anxiety disorders: Secondary | ICD-10-CM | POA: Diagnosis not present

## 2017-05-19 NOTE — Addendum Note (Signed)
Addended by: Caro Hight on: 05/19/2017 05:24 PM   Modules accepted: Orders

## 2017-05-19 NOTE — Telephone Encounter (Signed)
We can change the location.  Thanks

## 2017-05-19 NOTE — Telephone Encounter (Signed)
Mrs Pileggi called, requesting that Mr. Cromwell orders for [facility] PT be moved to sent to the [facility] location located in [place] due to it being closer and easier for the patient.  Please advise

## 2017-05-19 NOTE — Telephone Encounter (Signed)
done

## 2017-05-27 ENCOUNTER — Ambulatory Visit (HOSPITAL_BASED_OUTPATIENT_CLINIC_OR_DEPARTMENT_OTHER): Payer: Medicare Other | Attending: Neurology | Admitting: Internal Medicine

## 2017-05-27 DIAGNOSIS — G4733 Obstructive sleep apnea (adult) (pediatric): Secondary | ICD-10-CM | POA: Insufficient documentation

## 2017-05-27 DIAGNOSIS — G4752 REM sleep behavior disorder: Secondary | ICD-10-CM | POA: Diagnosis not present

## 2017-05-28 ENCOUNTER — Other Ambulatory Visit (HOSPITAL_BASED_OUTPATIENT_CLINIC_OR_DEPARTMENT_OTHER): Payer: Self-pay

## 2017-05-28 DIAGNOSIS — G4752 REM sleep behavior disorder: Secondary | ICD-10-CM

## 2017-05-31 DIAGNOSIS — M545 Low back pain: Secondary | ICD-10-CM | POA: Diagnosis not present

## 2017-05-31 DIAGNOSIS — M5432 Sciatica, left side: Secondary | ICD-10-CM | POA: Diagnosis not present

## 2017-06-06 DIAGNOSIS — G4752 REM sleep behavior disorder: Secondary | ICD-10-CM | POA: Diagnosis not present

## 2017-06-06 NOTE — Procedures (Signed)
  Patient Name: Caleb Woodward, Caleb Woodward Date: 05/27/2017 Gender: Male D.O.B: Sep 17, 1946 Age (years): 23 Referring Provider: Pieter Partridge Height (inches): 71 Interpreting Physician: Baird Lyons MD, ABSM Weight (lbs): 225 RPSGT: Earney [place] BMI: 31 MRN: [id number] Neck Size: 18.00 CLINICAL INFORMATION Sleep Study Type: NPSG  Indication for sleep study: OSA  Epworth Sleepiness Score: 8  SLEEP STUDY TECHNIQUE As per the AASM Manual for the Scoring of Sleep and Associated Events v2.3 (April 2016) with a hypopnea requiring 4% desaturations.  The channels recorded and monitored were frontal, central and occipital EEG, electrooculogram (EOG), submentalis EMG (chin), nasal and oral airflow, thoracic and abdominal wall motion, anterior tibialis EMG, snore microphone, electrocardiogram, and pulse oximetry.  MEDICATIONS Medications self-administered by patient taken the night of the study : none  reported  SLEEP ARCHITECTURE The study was initiated at 9:39:08 PM and ended at 4:27:42 AM.  Sleep onset time was 53.3 minutes and the sleep efficiency was 51.5%. The total sleep time was 210.5 minutes.  Stage REM latency was 185.0 minutes.  The patient spent 3.80% of the night in stage N1 sleep, 76.96% in stage N2 sleep, 0.00% in stage N3 and 19.24% in REM.  Alpha intrusion was absent.  Supine sleep was 54.80%.  RESPIRATORY PARAMETERS The overall apnea/hypopnea index (AHI) was 26.5 per hour. There were 61 total apneas, including 60 obstructive, 1 central and 0 mixed apneas. There were 32 hypopneas and 4 RERAs.  The AHI during Stage REM sleep was 5.9 per hour.  AHI while supine was 46.3 per hour.  The mean oxygen saturation was 94.11%. The minimum SpO2 during sleep was 83.00%.  Loud snoring was noted during this study.  CARDIAC DATA The 2 lead EKG demonstrated sinus rhythm. The mean heart rate was 54.81 beats per minute. Other EKG findings include: PVCs.  LEG MOVEMENT  DATA The total PLMS were 10 with a resulting PLMS index of 2.85. Associated arousal with leg movement index was 0.9 .  IMPRESSIONS - Moderate obstructive sleep apnea occurred during this study (AHI = 26.5/h). - Sleep onset was delayed. Patient did not have enough early sleep or events to meet protocol requirements for split CPAP titration. - No significant central sleep apnea occurred during this study (CAI = 0.3/h). - Oxygen desaturation was noted during this study (Min O2 = 83.00%, Mean 94%). - The patient snored with Loud snoring volume. - EKG findings include PVCs. - Clinically significant periodic limb movements did not occur during sleep. No significant associated arousals.  DIAGNOSIS - Obstructive Sleep Apnea (327.23 [G47.33 ICD-10])  RECOMMENDATIONS - Therapeutic CPAP titration to determine optimal pressure required to alleviate sleep disordered breathing. - Positional therapy avoiding supine position during sleep. - Sleep hygiene should be reviewed to assess factors that may improve sleep quality. - Weight management and regular exercise should be initiated or continued if appropriate.  [Electronically signed] 06/06/2017 02:07 PM  Baird Lyons MD, [place], American Board of Sleep Medicine   NPI: [id number]  [place], American Board of Sleep Medicine  ELECTRONICALLY SIGNED ON:  06/06/2017, 2:03 PM [place] PH: [phone]   FX: [phone] [place]

## 2017-06-08 DIAGNOSIS — M545 Low back pain: Secondary | ICD-10-CM | POA: Diagnosis not present

## 2017-06-08 DIAGNOSIS — M5432 Sciatica, left side: Secondary | ICD-10-CM | POA: Diagnosis not present

## 2017-06-10 ENCOUNTER — Ambulatory Visit: Payer: Medicare Other | Admitting: Physical Medicine & Rehabilitation

## 2017-06-10 DIAGNOSIS — M545 Low back pain: Secondary | ICD-10-CM | POA: Diagnosis not present

## 2017-06-10 DIAGNOSIS — M5432 Sciatica, left side: Secondary | ICD-10-CM | POA: Diagnosis not present

## 2017-06-14 ENCOUNTER — Telehealth: Payer: Self-pay | Admitting: Physical Medicine & Rehabilitation

## 2017-06-14 NOTE — Telephone Encounter (Signed)
Wife is calling about referral for Tens Unit.  I don't see an order or referral in system.  Can a referral be done?  Thanks.

## 2017-06-14 NOTE — Telephone Encounter (Signed)
There may be some issues.  I see him in 3 days and will discuss it with him then.  Thanks.

## 2017-06-15 DIAGNOSIS — M545 Low back pain: Secondary | ICD-10-CM | POA: Diagnosis not present

## 2017-06-15 DIAGNOSIS — M5432 Sciatica, left side: Secondary | ICD-10-CM | POA: Diagnosis not present

## 2017-06-17 ENCOUNTER — Encounter: Payer: Medicare Other | Attending: Physical Medicine & Rehabilitation | Admitting: Physical Medicine & Rehabilitation

## 2017-06-17 ENCOUNTER — Encounter: Payer: Self-pay | Admitting: Physical Medicine & Rehabilitation

## 2017-06-17 VITALS — BP 130/78 | HR 71

## 2017-06-17 DIAGNOSIS — K219 Gastro-esophageal reflux disease without esophagitis: Secondary | ICD-10-CM | POA: Diagnosis not present

## 2017-06-17 DIAGNOSIS — Z9181 History of falling: Secondary | ICD-10-CM | POA: Diagnosis not present

## 2017-06-17 DIAGNOSIS — Z833 Family history of diabetes mellitus: Secondary | ICD-10-CM | POA: Diagnosis not present

## 2017-06-17 DIAGNOSIS — M791 Myalgia, unspecified site: Secondary | ICD-10-CM

## 2017-06-17 DIAGNOSIS — Z981 Arthrodesis status: Secondary | ICD-10-CM | POA: Insufficient documentation

## 2017-06-17 DIAGNOSIS — G894 Chronic pain syndrome: Secondary | ICD-10-CM | POA: Diagnosis not present

## 2017-06-17 DIAGNOSIS — Z8546 Personal history of malignant neoplasm of prostate: Secondary | ICD-10-CM | POA: Diagnosis not present

## 2017-06-17 DIAGNOSIS — M545 Low back pain: Secondary | ICD-10-CM | POA: Diagnosis not present

## 2017-06-17 DIAGNOSIS — E7800 Pure hypercholesterolemia, unspecified: Secondary | ICD-10-CM | POA: Diagnosis not present

## 2017-06-17 DIAGNOSIS — G8929 Other chronic pain: Secondary | ICD-10-CM

## 2017-06-17 DIAGNOSIS — M5441 Lumbago with sciatica, right side: Secondary | ICD-10-CM

## 2017-06-17 DIAGNOSIS — G479 Sleep disorder, unspecified: Secondary | ICD-10-CM | POA: Diagnosis not present

## 2017-06-17 DIAGNOSIS — R200 Anesthesia of skin: Secondary | ICD-10-CM | POA: Insufficient documentation

## 2017-06-17 DIAGNOSIS — R269 Unspecified abnormalities of gait and mobility: Secondary | ICD-10-CM

## 2017-06-17 DIAGNOSIS — R202 Paresthesia of skin: Secondary | ICD-10-CM | POA: Insufficient documentation

## 2017-06-17 DIAGNOSIS — M961 Postlaminectomy syndrome, not elsewhere classified: Secondary | ICD-10-CM | POA: Diagnosis not present

## 2017-06-17 MED ORDER — METHOCARBAMOL 750 MG PO TABS: 750.0000 mg | ORAL_TABLET | Freq: Three times a day (TID) | ORAL | 1 refills | Status: DC | PRN
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
## 2017-06-18 ENCOUNTER — Telehealth: Payer: Self-pay | Admitting: *Deleted

## 2017-06-18 DIAGNOSIS — M545 Low back pain: Secondary | ICD-10-CM | POA: Diagnosis not present

## 2017-06-18 DIAGNOSIS — M5432 Sciatica, left side: Secondary | ICD-10-CM | POA: Diagnosis not present

## 2017-06-18 NOTE — Telephone Encounter (Signed)
Prior Authorization approved. Pharmacy notified. Patient notified.

## 2017-06-23 DIAGNOSIS — M5432 Sciatica, left side: Secondary | ICD-10-CM | POA: Diagnosis not present

## 2017-06-23 DIAGNOSIS — M545 Low back pain: Secondary | ICD-10-CM | POA: Diagnosis not present

## 2017-06-25 DIAGNOSIS — M5432 Sciatica, left side: Secondary | ICD-10-CM | POA: Diagnosis not present

## 2017-06-25 DIAGNOSIS — M545 Low back pain: Secondary | ICD-10-CM | POA: Diagnosis not present

## 2017-06-28 DIAGNOSIS — M5432 Sciatica, left side: Secondary | ICD-10-CM | POA: Diagnosis not present

## 2017-06-28 DIAGNOSIS — M545 Low back pain: Secondary | ICD-10-CM | POA: Diagnosis not present

## 2017-06-30 DIAGNOSIS — M5432 Sciatica, left side: Secondary | ICD-10-CM | POA: Diagnosis not present

## 2017-06-30 DIAGNOSIS — M545 Low back pain: Secondary | ICD-10-CM | POA: Diagnosis not present

## 2017-07-06 DIAGNOSIS — M5432 Sciatica, left side: Secondary | ICD-10-CM | POA: Diagnosis not present

## 2017-07-06 DIAGNOSIS — M545 Low back pain: Secondary | ICD-10-CM | POA: Diagnosis not present

## 2017-07-08 DIAGNOSIS — M545 Low back pain: Secondary | ICD-10-CM | POA: Diagnosis not present

## 2017-07-08 DIAGNOSIS — M5432 Sciatica, left side: Secondary | ICD-10-CM | POA: Diagnosis not present

## 2017-07-13 ENCOUNTER — Other Ambulatory Visit: Payer: Self-pay | Admitting: Physical Medicine & Rehabilitation

## 2017-07-14 DIAGNOSIS — M5432 Sciatica, left side: Secondary | ICD-10-CM | POA: Diagnosis not present

## 2017-07-14 DIAGNOSIS — M545 Low back pain: Secondary | ICD-10-CM | POA: Diagnosis not present

## 2017-07-15 ENCOUNTER — Encounter: Payer: Self-pay | Admitting: Physical Medicine & Rehabilitation

## 2017-07-15 ENCOUNTER — Encounter: Payer: Medicare Other | Attending: Physical Medicine & Rehabilitation | Admitting: Physical Medicine & Rehabilitation

## 2017-07-15 VITALS — BP 135/77 | HR 63

## 2017-07-15 DIAGNOSIS — M545 Low back pain: Secondary | ICD-10-CM | POA: Diagnosis not present

## 2017-07-15 DIAGNOSIS — E538 Deficiency of other specified B group vitamins: Secondary | ICD-10-CM | POA: Diagnosis not present

## 2017-07-15 DIAGNOSIS — M5441 Lumbago with sciatica, right side: Secondary | ICD-10-CM

## 2017-07-15 DIAGNOSIS — E559 Vitamin D deficiency, unspecified: Secondary | ICD-10-CM

## 2017-07-15 DIAGNOSIS — R269 Unspecified abnormalities of gait and mobility: Secondary | ICD-10-CM

## 2017-07-15 DIAGNOSIS — R200 Anesthesia of skin: Secondary | ICD-10-CM | POA: Insufficient documentation

## 2017-07-15 DIAGNOSIS — M961 Postlaminectomy syndrome, not elsewhere classified: Secondary | ICD-10-CM

## 2017-07-15 DIAGNOSIS — G894 Chronic pain syndrome: Secondary | ICD-10-CM | POA: Diagnosis not present

## 2017-07-15 DIAGNOSIS — Z833 Family history of diabetes mellitus: Secondary | ICD-10-CM | POA: Insufficient documentation

## 2017-07-15 DIAGNOSIS — E7800 Pure hypercholesterolemia, unspecified: Secondary | ICD-10-CM | POA: Insufficient documentation

## 2017-07-15 DIAGNOSIS — R202 Paresthesia of skin: Secondary | ICD-10-CM

## 2017-07-15 DIAGNOSIS — M792 Neuralgia and neuritis, unspecified: Secondary | ICD-10-CM

## 2017-07-15 DIAGNOSIS — Z981 Arthrodesis status: Secondary | ICD-10-CM | POA: Insufficient documentation

## 2017-07-15 DIAGNOSIS — G8929 Other chronic pain: Secondary | ICD-10-CM

## 2017-07-15 DIAGNOSIS — K219 Gastro-esophageal reflux disease without esophagitis: Secondary | ICD-10-CM | POA: Insufficient documentation

## 2017-07-15 DIAGNOSIS — Z8546 Personal history of malignant neoplasm of prostate: Secondary | ICD-10-CM | POA: Insufficient documentation

## 2017-07-15 DIAGNOSIS — M791 Myalgia, unspecified site: Secondary | ICD-10-CM

## 2017-07-15 DIAGNOSIS — Z9181 History of falling: Secondary | ICD-10-CM | POA: Insufficient documentation

## 2017-07-15 DIAGNOSIS — G479 Sleep disorder, unspecified: Secondary | ICD-10-CM

## 2017-07-15 MED ORDER — AMITRIPTYLINE HCL 25 MG PO TABS: 25.0000 mg | ORAL_TABLET | Freq: Every day | ORAL | 1 refills | Status: DC

## 2017-07-15 MED ORDER — DULOXETINE HCL 60 MG PO CPEP: 60.0000 mg | ORAL_CAPSULE | Freq: Every day | ORAL | 1 refills | Status: DC

## 2017-07-15 MED ORDER — LIDOCAINE 5 % EX PTCH: 1.0000 | MEDICATED_PATCH | CUTANEOUS | 0 refills | Status: DC

## 2017-07-15 NOTE — Addendum Note (Signed)
Addended by: Delice Lesch A on: 07/15/2017 10:51 AM   Modules accepted: Orders

## 2017-07-15 NOTE — Addendum Note (Signed)
Addended by: Caro Hight on: 07/15/2017 02:15 PM   Modules accepted: Orders
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
## 2017-07-16 ENCOUNTER — Ambulatory Visit (INDEPENDENT_AMBULATORY_CARE_PROVIDER_SITE_OTHER): Payer: Medicare Other | Admitting: Neurology

## 2017-07-16 ENCOUNTER — Encounter: Payer: Self-pay | Admitting: Neurology

## 2017-07-16 VITALS — BP 120/66 | HR 55 | Ht 70.5 in | Wt 218.5 lb

## 2017-07-16 DIAGNOSIS — G3184 Mild cognitive impairment, so stated: Secondary | ICD-10-CM | POA: Diagnosis not present

## 2017-07-16 DIAGNOSIS — M5417 Radiculopathy, lumbosacral region: Secondary | ICD-10-CM

## 2017-07-16 DIAGNOSIS — G4733 Obstructive sleep apnea (adult) (pediatric): Secondary | ICD-10-CM
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
## 2017-07-16 NOTE — Patient Instructions (Signed)
I will refer you to sleep medicine for evaluation and treatment of obstructive sleep apnea.

## 2017-07-23 ENCOUNTER — Telehealth: Payer: Self-pay | Admitting: *Deleted

## 2017-07-23 NOTE — Telephone Encounter (Signed)
Prior Authorization for lidocaine 5% patches was DENIED. FDA clinical indications are diabetic neuropathy and post-herpetic neuralgia

## 2017-08-09 DIAGNOSIS — E559 Vitamin D deficiency, unspecified: Secondary | ICD-10-CM | POA: Diagnosis not present

## 2017-08-09 DIAGNOSIS — R202 Paresthesia of skin: Secondary | ICD-10-CM | POA: Diagnosis not present

## 2017-08-10 LAB — VITAMIN D 25 HYDROXY (VIT D DEFICIENCY, FRACTURES): Vit D, 25-Hydroxy: 40.7 ng/mL (ref 30.0–100.0)

## 2017-08-12 ENCOUNTER — Encounter: Payer: Medicare Other | Admitting: Physical Medicine & Rehabilitation

## 2017-08-20 ENCOUNTER — Encounter: Payer: Self-pay | Admitting: Physical Medicine & Rehabilitation

## 2017-08-20 ENCOUNTER — Encounter: Payer: Medicare Other | Attending: Physical Medicine & Rehabilitation | Admitting: Physical Medicine & Rehabilitation

## 2017-08-20 VITALS — BP 167/77 | HR 76

## 2017-08-20 DIAGNOSIS — R269 Unspecified abnormalities of gait and mobility: Secondary | ICD-10-CM | POA: Insufficient documentation

## 2017-08-20 DIAGNOSIS — M5441 Lumbago with sciatica, right side: Secondary | ICD-10-CM

## 2017-08-20 DIAGNOSIS — G4733 Obstructive sleep apnea (adult) (pediatric): Secondary | ICD-10-CM | POA: Insufficient documentation

## 2017-08-20 DIAGNOSIS — G894 Chronic pain syndrome: Secondary | ICD-10-CM

## 2017-08-20 DIAGNOSIS — G479 Sleep disorder, unspecified: Secondary | ICD-10-CM

## 2017-08-20 DIAGNOSIS — M792 Neuralgia and neuritis, unspecified: Secondary | ICD-10-CM | POA: Diagnosis not present

## 2017-08-20 DIAGNOSIS — R202 Paresthesia of skin: Secondary | ICD-10-CM | POA: Insufficient documentation

## 2017-08-20 DIAGNOSIS — Z833 Family history of diabetes mellitus: Secondary | ICD-10-CM | POA: Insufficient documentation

## 2017-08-20 DIAGNOSIS — M961 Postlaminectomy syndrome, not elsewhere classified: Secondary | ICD-10-CM | POA: Insufficient documentation

## 2017-08-20 DIAGNOSIS — Z9181 History of falling: Secondary | ICD-10-CM | POA: Insufficient documentation

## 2017-08-20 DIAGNOSIS — Z981 Arthrodesis status: Secondary | ICD-10-CM | POA: Diagnosis not present

## 2017-08-20 DIAGNOSIS — R200 Anesthesia of skin: Secondary | ICD-10-CM | POA: Insufficient documentation

## 2017-08-20 DIAGNOSIS — G8929 Other chronic pain: Secondary | ICD-10-CM | POA: Insufficient documentation

## 2017-08-20 DIAGNOSIS — K219 Gastro-esophageal reflux disease without esophagitis: Secondary | ICD-10-CM | POA: Insufficient documentation

## 2017-08-20 DIAGNOSIS — M791 Myalgia, unspecified site: Secondary | ICD-10-CM | POA: Insufficient documentation

## 2017-08-20 DIAGNOSIS — M545 Low back pain: Secondary | ICD-10-CM | POA: Insufficient documentation

## 2017-08-20 DIAGNOSIS — E7800 Pure hypercholesterolemia, unspecified: Secondary | ICD-10-CM | POA: Diagnosis not present

## 2017-08-20 DIAGNOSIS — Z8546 Personal history of malignant neoplasm of prostate: Secondary | ICD-10-CM | POA: Insufficient documentation
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
## 2017-08-20 NOTE — Patient Instructions (Signed)
Please consider trial of lidoderm patches

## 2017-08-30 ENCOUNTER — Institutional Professional Consult (permissible substitution): Payer: PRIVATE HEALTH INSURANCE | Admitting: Internal Medicine

## 2017-09-01 ENCOUNTER — Encounter: Payer: Self-pay | Admitting: Internal Medicine

## 2017-09-01 ENCOUNTER — Ambulatory Visit (INDEPENDENT_AMBULATORY_CARE_PROVIDER_SITE_OTHER): Payer: Medicare Other | Admitting: Internal Medicine

## 2017-09-01 VITALS — BP 126/80 | HR 85 | Ht 70.0 in | Wt 215.6 lb

## 2017-09-01 DIAGNOSIS — G4752 REM sleep behavior disorder: Secondary | ICD-10-CM | POA: Insufficient documentation

## 2017-09-01 DIAGNOSIS — G4733 Obstructive sleep apnea (adult) (pediatric): Secondary | ICD-10-CM | POA: Diagnosis not present

## 2017-09-01 DIAGNOSIS — G4700 Insomnia, unspecified: Secondary | ICD-10-CM | POA: Insufficient documentation

## 2017-09-01 DIAGNOSIS — F5101 Primary insomnia: Secondary | ICD-10-CM | POA: Diagnosis not present

## 2017-09-01 MED ORDER — CLONAZEPAM 0.5 MG PO TABS: ORAL_TABLET | ORAL | 2 refills | Status: DC

## 2017-09-01 NOTE — Patient Instructions (Signed)
Order- new DME, new CPAP auto 5-20, mask of choice, humidifier, supplies, AirView   Dx OSA  Script for clonazepam 0.5 mg     Try 1-3 tabs, taken about 30 minutes before bedtime   Please call as needed

## 2017-09-01 NOTE — Assessment & Plan Note (Signed)
In spite of being an uncomfortable night for him because of back pain, sleep study showed definite moderately severe obstructive sleep apnea. I reviewed sleep apnea, medical concerns and, treatments. Plan-he will wants to start CPAP. We will arrange AutoPap 5-20 with appropriate follow-up.
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
## 2017-09-01 NOTE — Assessment & Plan Note (Signed)
He has difficulty falling asleep, after feeling tired watching TV. I think he is describing psychophysiologic insomnia "performance anxiety", but complicated by back pain. His amitriptyline did not seem to improve with dose increase and may have actually made him worse. Plan-try taking clonazepam as discussed, instead of amitriptyline. Clonazepam may help with sleep onset and maintenance and might also calms some of his kicking.
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
## 2017-11-19 ENCOUNTER — Encounter: Payer: Medicare Other | Attending: Physical Medicine & Rehabilitation | Admitting: Physical Medicine & Rehabilitation

## 2017-12-03 ENCOUNTER — Ambulatory Visit (INDEPENDENT_AMBULATORY_CARE_PROVIDER_SITE_OTHER): Payer: Medicare Other | Admitting: Internal Medicine

## 2017-12-03 ENCOUNTER — Encounter: Payer: Self-pay | Admitting: Internal Medicine

## 2017-12-03 DIAGNOSIS — G4752 REM sleep behavior disorder: Secondary | ICD-10-CM | POA: Diagnosis not present

## 2017-12-03 DIAGNOSIS — G4733 Obstructive sleep apnea (adult) (pediatric): Secondary | ICD-10-CM | POA: Diagnosis not present

## 2017-12-03 NOTE — Patient Instructions (Signed)
Over time, I hope we can get you comfortable using CPAP all night, every night  You can ask [facility] about size options for the nasal pillows mask, or possibly changing to a "nasal mask" that go around the outside of your nose.  We gave you clonazepam to try to help you sleep a little more soundly with less nightmare disturbance. You can try up to 3 tabs per night, if needed.   Please let [facility] know if you keep having problems.  Please call us if we can help

## 2017-12-03 NOTE — Assessment & Plan Note (Signed)
CPAP seems to prevent much of this when he wears it.  Clonazepam also helps. Plan-okay to increase clonazepam to 1.5 mg at bedtime if needed as discussed.
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
## 2017-12-03 NOTE — Assessment & Plan Note (Signed)
Sleep is disturbed by his back pain but there have been issues also with mask comfort.  He is very satisfied with the pressures and reports that CPAP prevents his active nightmares. Plan-he will work with his DME on mask fit and comfort.  Continue CPAP auto 5-20.

## 2017-12-23 ENCOUNTER — Telehealth: Payer: Self-pay | Admitting: Internal Medicine

## 2017-12-23 NOTE — Telephone Encounter (Signed)
Records have been faxed confirmation received

## 2017-12-24 DIAGNOSIS — Z6829 Body mass index (BMI) 29.0-29.9, adult: Secondary | ICD-10-CM | POA: Diagnosis not present

## 2017-12-24 DIAGNOSIS — E782 Mixed hyperlipidemia: Secondary | ICD-10-CM | POA: Diagnosis not present

## 2017-12-24 DIAGNOSIS — F418 Other specified anxiety disorders: Secondary | ICD-10-CM | POA: Diagnosis not present

## 2017-12-24 DIAGNOSIS — R7303 Prediabetes: Secondary | ICD-10-CM | POA: Diagnosis not present

## 2017-12-29 ENCOUNTER — Telehealth: Payer: Self-pay | Admitting: Internal Medicine

## 2017-12-29 DIAGNOSIS — G4733 Obstructive sleep apnea (adult) (pediatric): Secondary | ICD-10-CM

## 2017-12-29 NOTE — Telephone Encounter (Signed)
Spoke with patient's wife. She wanted to know how to obtain supplies for patient's CPAP machine. Advised her that the patient would first need an order from the doctor to be sent to the DME. She stated that the patient was seen on 2/1 and was never advised about the supplies. Offered to place an order to [facility], she agreed.   Order has been placed. Nothing else needed at time of call.

## 2018-01-06 ENCOUNTER — Encounter: Payer: Self-pay | Admitting: Physical Medicine & Rehabilitation

## 2018-01-06 ENCOUNTER — Encounter: Payer: Medicare Other | Attending: Physical Medicine & Rehabilitation | Admitting: Physical Medicine & Rehabilitation

## 2018-01-06 VITALS — BP 150/90 | HR 71

## 2018-01-06 DIAGNOSIS — M5441 Lumbago with sciatica, right side: Secondary | ICD-10-CM | POA: Diagnosis not present

## 2018-01-06 DIAGNOSIS — G479 Sleep disorder, unspecified: Secondary | ICD-10-CM | POA: Diagnosis not present

## 2018-01-06 DIAGNOSIS — G8929 Other chronic pain: Secondary | ICD-10-CM

## 2018-01-06 DIAGNOSIS — M792 Neuralgia and neuritis, unspecified: Secondary | ICD-10-CM

## 2018-01-06 DIAGNOSIS — G894 Chronic pain syndrome: Secondary | ICD-10-CM

## 2018-01-06 DIAGNOSIS — M961 Postlaminectomy syndrome, not elsewhere classified: Secondary | ICD-10-CM | POA: Diagnosis not present

## 2018-01-06 DIAGNOSIS — R269 Unspecified abnormalities of gait and mobility: Secondary | ICD-10-CM | POA: Diagnosis not present

## 2018-01-06 MED ORDER — PREGABALIN 50 MG PO CAPS
50.0000 mg | ORAL_CAPSULE | Freq: Three times a day (TID) | ORAL | 1 refills | Status: DC
Start: 2018-01-06 — End: 2018-06-20

## 2018-01-06 NOTE — Patient Instructions (Signed)
Trail over the counter Lidoderm patches
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
## 2018-01-12 ENCOUNTER — Ambulatory Visit: Payer: Medicare Other | Admitting: Physical Medicine & Rehabilitation

## 2018-01-25 ENCOUNTER — Other Ambulatory Visit: Payer: Self-pay | Admitting: Physical Medicine & Rehabilitation

## 2018-01-25 ENCOUNTER — Other Ambulatory Visit: Payer: Self-pay | Admitting: Internal Medicine

## 2018-01-25 NOTE — Telephone Encounter (Signed)
Ok to refill total 6 months 

## 2018-01-25 NOTE — Telephone Encounter (Signed)
CY Please advise on refill. Pt last seen 12-2017. Thanks.

## 2018-01-31 ENCOUNTER — Telehealth: Payer: Self-pay | Admitting: *Deleted

## 2018-01-31 DIAGNOSIS — Z01812 Encounter for preprocedural laboratory examination: Secondary | ICD-10-CM

## 2018-01-31 NOTE — Telephone Encounter (Signed)
BUN and CReat ordered for pre MRI testing.

## 2018-01-31 NOTE — Telephone Encounter (Signed)
Caleb Woodward called and is upset they have not been given appt for MRI Dr Posey Pronto ordered.  I see the order.  Can you follow up on this please?  thx

## 2018-02-01 ENCOUNTER — Ambulatory Visit (HOSPITAL_COMMUNITY)
Admission: RE | Admit: 2018-02-01 | Discharge: 2018-02-01 | Disposition: A | Discharge status: Home / Self Care | Payer: Medicare Other | Source: Ambulatory Visit | Attending: Physical Medicine & Rehabilitation | Admitting: Physical Medicine & Rehabilitation

## 2018-02-01 ENCOUNTER — Encounter (HOSPITAL_COMMUNITY): Payer: Self-pay | Admitting: Radiology

## 2018-02-01 DIAGNOSIS — M48061 Spinal stenosis, lumbar region without neurogenic claudication: Secondary | ICD-10-CM | POA: Insufficient documentation

## 2018-02-01 DIAGNOSIS — Z9889 Other specified postprocedural states: Secondary | ICD-10-CM | POA: Diagnosis not present

## 2018-02-01 DIAGNOSIS — M5126 Other intervertebral disc displacement, lumbar region: Secondary | ICD-10-CM | POA: Diagnosis not present

## 2018-02-01 DIAGNOSIS — G894 Chronic pain syndrome: Secondary | ICD-10-CM | POA: Diagnosis not present

## 2018-02-01 DIAGNOSIS — G8929 Other chronic pain: Secondary | ICD-10-CM

## 2018-02-01 DIAGNOSIS — M5441 Lumbago with sciatica, right side: Secondary | ICD-10-CM | POA: Insufficient documentation

## 2018-02-01 LAB — CREATININE, SERUM
Creatinine, Ser: 0.99 mg/dL (ref 0.61–1.24)
GFR calc Af Amer: >60 mL/min (ref >60)
GFR calc non Af Amer: >60 mL/min (ref >60)

## 2018-02-01 MED ORDER — GADOBENATE DIMEGLUMINE 529 MG/ML IV SOLN
20.0000 mL | Freq: Once | INTRAVENOUS | Status: AC
  Administered 2018-02-01: 20 mL via INTRAVENOUS

## 2018-02-03 ENCOUNTER — Encounter: Payer: Medicare Other | Attending: Physical Medicine & Rehabilitation | Admitting: Physical Medicine & Rehabilitation

## 2018-02-03 ENCOUNTER — Encounter: Payer: Self-pay | Admitting: Physical Medicine & Rehabilitation

## 2018-02-03 VITALS — BP 129/76 | HR 66 | Ht 70.5 in | Wt 216.0 lb

## 2018-02-03 DIAGNOSIS — M792 Neuralgia and neuritis, unspecified: Secondary | ICD-10-CM

## 2018-02-03 DIAGNOSIS — G894 Chronic pain syndrome: Secondary | ICD-10-CM | POA: Diagnosis not present

## 2018-02-03 DIAGNOSIS — M961 Postlaminectomy syndrome, not elsewhere classified: Secondary | ICD-10-CM | POA: Diagnosis not present

## 2018-02-03 DIAGNOSIS — G479 Sleep disorder, unspecified: Secondary | ICD-10-CM | POA: Diagnosis not present

## 2018-02-03 DIAGNOSIS — R269 Unspecified abnormalities of gait and mobility: Secondary | ICD-10-CM

## 2018-02-03 DIAGNOSIS — M791 Myalgia, unspecified site: Secondary | ICD-10-CM | POA: Diagnosis not present

## 2018-02-03 DIAGNOSIS — M5441 Lumbago with sciatica, right side: Secondary | ICD-10-CM

## 2018-02-03 DIAGNOSIS — G8929 Other chronic pain: Secondary | ICD-10-CM | POA: Diagnosis not present

## 2018-02-03 MED ORDER — DICLOFENAC POTASSIUM 50 MG PO TABS
50.0000 mg | ORAL_TABLET | Freq: Three times a day (TID) | ORAL | 1 refills | Status: DC
Start: 2018-02-03 — End: 2018-06-20

## 2018-02-03 MED ORDER — METHOCARBAMOL 750 MG PO TABS: 750.0000 mg | ORAL_TABLET | Freq: Two times a day (BID) | ORAL | 1 refills | Status: DC | PRN
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
## 2018-02-04 ENCOUNTER — Ambulatory Visit: Payer: Medicare Other | Admitting: Physical Medicine & Rehabilitation

## 2018-02-10 ENCOUNTER — Ambulatory Visit: Payer: Medicare Other | Admitting: Physical Medicine & Rehabilitation

## 2018-02-15 ENCOUNTER — Encounter: Payer: Self-pay | Admitting: Physical Medicine & Rehabilitation

## 2018-02-15 ENCOUNTER — Ambulatory Visit (HOSPITAL_BASED_OUTPATIENT_CLINIC_OR_DEPARTMENT_OTHER): Payer: Medicare Other | Admitting: Physical Medicine & Rehabilitation

## 2018-02-15 VITALS — BP 155/72 | HR 73 | Resp 14 | Ht 70.5 in | Wt 204.0 lb

## 2018-02-15 DIAGNOSIS — M47816 Spondylosis without myelopathy or radiculopathy, lumbar region: Secondary | ICD-10-CM

## 2018-02-15 DIAGNOSIS — G894 Chronic pain syndrome: Secondary | ICD-10-CM | POA: Diagnosis not present

## 2018-02-15 DIAGNOSIS — G8929 Other chronic pain: Secondary | ICD-10-CM | POA: Diagnosis not present

## 2018-02-15 DIAGNOSIS — M5441 Lumbago with sciatica, right side: Secondary | ICD-10-CM | POA: Diagnosis not present

## 2018-02-15 NOTE — Progress Notes (Signed)
  PROCEDURE RECORD [facility]   Name: Caleb Woodward DOB:1946-07-11 MRN: [id number]  Date:02/15/2018  Physician: Alysia Penna, MD    Nurse/CMA: Jaleeya Mcnelly, CMA  Allergies:  Allergies  Allergen Reactions  . Cymbalta [Duloxetine Hcl] Nausea And Vomiting  . Meloxicam Nausea And Vomiting    Consent Signed: Yes.    Is patient diabetic? No.  CBG today?   Pregnant: No. LMP: No LMP for male patient. (age [age])  Anticoagulants: no Anti-inflammatory: no Antibiotics: no  Procedure: medial branch block Position: Prone Start Time: 10:45am  End Time: 10:51am  Fluoro Time: 29s  RN/CMA Dorsey Charette, CMA Kerina Simoneau, CMA    Time 10:30am 10:59am    BP 155/72 153/76    Pulse 73 62    Respirations 14 14    O2 Sat 96 96    S/S 6 6    Pain Level 3/10 0/10     D/C home with wife, patient A & O X 3, D/C instructions reviewed, and sits independently.

## 2018-02-15 NOTE — Patient Instructions (Signed)

## 2018-02-15 NOTE — Procedures (Signed)
Right lumbar L3, L4 medial branch blocks and L5 dorsal ramus injection under fluoroscopic guidance  Indication: Right Lumbar pain which is not relieved by medication management or other conservative care and interfering with self-care and mobility.  Informed consent was obtained after describing risks and benefits of the procedure with the patient, this includes bleeding, bruising, infection, paralysis and medication side effects. The patient wishes to proceed and has given written consent. The patient was placed in a prone position. The lumbar area was marked and prepped with Betadine. One ML of 1% lidocaine was injected into each of 3 areas into the skin and subcutaneous tissue. Then a 22-gauge 3.5 spinal needle was inserted targeting the junction of the Right S1 superior articular process and sacral ala junction. Needle was advanced under fluoroscopic guidance. Bone contact was made.Isovue 200 was injected x0.5 mL demonstrating no intravascular uptake. Then a solution containing 2% MPF lidocaine was injected x0.5 mL. Then the Right L5 superior articular process in transverse process junction was targeted. Bone contact was made.Isovue 200 was injected x0.5 mL demonstrating no intravascular uptake. Then a solution containing 2% MPF lidocaine was injected x0.5 mL. Then the Right L4 superior articular process in transverse process junction was targeted. Bone contact was made. Isovue 200 was injected x0.5 mL demonstrating no intravascular uptake. Then a solution containing2% MPF lidocaine was injected x0.5 mL Patient tolerated procedure well. Post procedure instructions were given. Please refer to post procedure form.

## 2018-02-15 NOTE — Progress Notes (Signed)
Right lumbar L3, L4 medial branch blocks and L5 dorsal ramus injection under fluoroscopic guidance  Indication: Right Lumbar pain which is not relieved by medication management or other conservative care and interfering with self-care and mobility.  Informed consent was obtained after describing risks and benefits of the procedure with the patient, this includes bleeding, bruising, infection, paralysis and medication side effects. The patient wishes to proceed and has given written consent. The patient was placed in a prone position. The lumbar area was marked and prepped with Betadine. One ML of 1% lidocaine was injected into each of 3 areas into the skin and subcutaneous tissue. Then a 22-gauge 3.5 spinal needle was inserted targeting the junction of the Right S1 superior articular process and sacral ala junction. Needle was advanced under fluoroscopic guidance. Bone contact was made.Isovue 200 was injected x0.5 mL demonstrating no intravascular uptake. Then a solution containing 2% MPF lidocaine was injected x0.5 mL. Then the Right L5 superior articular process in transverse process junction was targeted. Bone contact was made.Isovue 200 was injected x0.5 mL demonstrating no intravascular uptake. Then a solution containing 2% MPF lidocaine was injected x0.5 mL. Then the Right L4 superior articular process in transverse process junction was targeted. Bone contact was made. Isovue 200 was injected x0.5 mL demonstrating no intravascular uptake. Then a solution containing2% MPF lidocaine was injected x0.5 mL Patient tolerated procedure well. Post procedure instructions were given. Please refer to post procedure form.

## 2018-02-17 ENCOUNTER — Telehealth: Payer: Self-pay | Admitting: *Deleted

## 2018-02-17 NOTE — Telephone Encounter (Signed)
Contacted patient and left message to come off lyrica slowly. Monitor success or failure of medial branch block

## 2018-02-17 NOTE — Telephone Encounter (Signed)
Patients wife left a message stating that Dr. Posey Pronto had made some changes to patients medication regime.  She states the Lyrica is causing extreme side effects that are not good and is seeking advisement. She feels the patient needs to come off the Lyrica.  Please call patient or advise on next steps.

## 2018-02-17 NOTE — Telephone Encounter (Signed)
He may wean off of the Lyrica and we will await benefit of MBB. Thanks.

## 2018-03-03 ENCOUNTER — Encounter: Payer: Medicare Other | Admitting: Physical Medicine & Rehabilitation

## 2018-03-11 ENCOUNTER — Encounter: Payer: Medicare Other | Attending: Physical Medicine & Rehabilitation | Admitting: Physical Medicine & Rehabilitation

## 2018-03-11 ENCOUNTER — Encounter: Payer: Self-pay | Admitting: Physical Medicine & Rehabilitation

## 2018-03-11 VITALS — BP 136/87 | HR 66 | Resp 14 | Ht 70.0 in | Wt 205.0 lb

## 2018-03-11 DIAGNOSIS — M791 Myalgia, unspecified site: Secondary | ICD-10-CM

## 2018-03-11 DIAGNOSIS — R269 Unspecified abnormalities of gait and mobility: Secondary | ICD-10-CM

## 2018-03-11 DIAGNOSIS — G479 Sleep disorder, unspecified: Secondary | ICD-10-CM | POA: Diagnosis not present

## 2018-03-11 DIAGNOSIS — M961 Postlaminectomy syndrome, not elsewhere classified: Secondary | ICD-10-CM | POA: Diagnosis not present

## 2018-03-11 DIAGNOSIS — M47816 Spondylosis without myelopathy or radiculopathy, lumbar region: Secondary | ICD-10-CM | POA: Diagnosis not present

## 2018-03-11 DIAGNOSIS — M5441 Lumbago with sciatica, right side: Secondary | ICD-10-CM

## 2018-03-11 DIAGNOSIS — G894 Chronic pain syndrome: Secondary | ICD-10-CM

## 2018-03-11 DIAGNOSIS — G8929 Other chronic pain: Secondary | ICD-10-CM

## 2018-03-11 DIAGNOSIS — R202 Paresthesia of skin: Secondary | ICD-10-CM | POA: Diagnosis not present

## 2018-03-11 DIAGNOSIS — M792 Neuralgia and neuritis, unspecified: Secondary | ICD-10-CM
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
## 2018-03-30 ENCOUNTER — Telehealth: Payer: Self-pay | Admitting: Physical Medicine & Rehabilitation

## 2018-03-30 NOTE — Telephone Encounter (Signed)
PTN WIFE Caleb Woodward CALLED [phone] WOULD LIKE TO DO ANOTHER INJECTION - CAN ONE OF YOU APPROVE OR AGREE THIS IS APPROPRIATE AND WE WILL BOOK

## 2018-03-30 NOTE — Telephone Encounter (Signed)
May schedule R L3-4-5 MBB

## 2018-03-30 NOTE — Telephone Encounter (Signed)
Reviewed procedure with Mrs Penson. He is on no blood thinners and she will be his driver. Avoid large meal before procedure and arrive at least 15 min early.

## 2018-03-31 ENCOUNTER — Encounter: Payer: Self-pay | Admitting: Physical Medicine & Rehabilitation

## 2018-03-31 ENCOUNTER — Ambulatory Visit (HOSPITAL_BASED_OUTPATIENT_CLINIC_OR_DEPARTMENT_OTHER): Payer: Medicare Other | Admitting: Physical Medicine & Rehabilitation

## 2018-03-31 VITALS — BP 129/90 | HR 63 | Resp 14 | Ht 70.5 in | Wt 201.0 lb

## 2018-03-31 DIAGNOSIS — M47816 Spondylosis without myelopathy or radiculopathy, lumbar region: Secondary | ICD-10-CM | POA: Diagnosis not present

## 2018-03-31 DIAGNOSIS — M5441 Lumbago with sciatica, right side: Secondary | ICD-10-CM | POA: Diagnosis not present

## 2018-03-31 DIAGNOSIS — G8929 Other chronic pain: Secondary | ICD-10-CM | POA: Diagnosis not present

## 2018-03-31 DIAGNOSIS — M961 Postlaminectomy syndrome, not elsewhere classified: Secondary | ICD-10-CM | POA: Diagnosis not present

## 2018-03-31 DIAGNOSIS — G894 Chronic pain syndrome: Secondary | ICD-10-CM | POA: Diagnosis not present

## 2018-03-31 NOTE — Progress Notes (Signed)
Right lumbar L3, L4 medial branch blocks and L5 dorsal ramus injection under fluoroscopic guidance  Indication: Right Lumbar pain which is not relieved by medication management or other conservative care and interfering with self-care and mobility.  Informed consent was obtained after describing risks and benefits of the procedure with the patient, this includes bleeding, bruising, infection, paralysis and medication side effects. The patient wishes to proceed and has given written consent. The patient was placed in a prone position. The lumbar area was marked and prepped with Betadine. One ML of 1% lidocaine was injected into each of 3 areas into the skin and subcutaneous tissue. Then a 22-gauge 3.5 spinal needle was inserted targeting the junction of the Right S1 superior articular process and sacral ala junction. Needle was advanced under fluoroscopic guidance. Bone contact was made.Isovue 200 was injected x0.5 mL demonstrating no intravascular uptake. Then a solution containing 2% MPF lidocaine was injected x0.5 mL. Then the Right L5 superior articular process in transverse process junction was targeted. Bone contact was made.Isovue 200 was injected x0.5 mL demonstrating no intravascular uptake. Then a solution containing 2% MPF lidocaine was injected x0.5 mL. Then the Right L4 superior articular process in transverse process junction was targeted. Bone contact was made. Isovue 200 was injected x0.5 mL demonstrating no intravascular uptake. Then a solution containing2% MPF lidocaine was injected x0.5 mL Patient tolerated procedure well. Post procedure instructions were given. Please refer to post procedure form.

## 2018-03-31 NOTE — Progress Notes (Signed)
  PROCEDURE RECORD [facility]   Name: Caleb Woodward DOB:1946/03/02 MRN: [id number]  Date:03/31/2018  Physician: Alysia Penna, MD    Nurse/CMA: Bright CMA  Allergies:  Allergies  Allergen Reactions  . Cymbalta [Duloxetine Hcl] Nausea And Vomiting  . Meloxicam Nausea And Vomiting    Consent Signed: Yes.    Is patient diabetic? No.  CBG today? NA  Pregnant: No. LMP: No LMP for male patient. (age [age])  Anticoagulants: no Anti-inflammatory: no Antibiotics: no  Procedure: Right L3-5 Medial Branch Block  Position: Prone   Start Time: 955am End Time: 1000am Fluoro Time: 24s  RN/CMA W[place]ling CMA Bright CMA    Time 922 1005am    BP 129/90 144/85    Pulse 63 66    Respirations 16 16    O2 Sat 95 96    S/S 6 6    Pain Level 4/10 3/10     D/C home with self, patient A & O X 3, D/C instructions reviewed, and sits independently.

## 2018-03-31 NOTE — Patient Instructions (Signed)

## 2018-04-04 ENCOUNTER — Encounter: Payer: Self-pay | Admitting: Internal Medicine

## 2018-04-04 ENCOUNTER — Ambulatory Visit (INDEPENDENT_AMBULATORY_CARE_PROVIDER_SITE_OTHER): Payer: Medicare Other | Admitting: Internal Medicine

## 2018-04-04 VITALS — BP 152/90 | HR 65 | Ht 70.5 in | Wt 209.6 lb

## 2018-04-04 DIAGNOSIS — G4733 Obstructive sleep apnea (adult) (pediatric): Secondary | ICD-10-CM

## 2018-04-04 DIAGNOSIS — F5101 Primary insomnia: Secondary | ICD-10-CM

## 2018-04-04 DIAGNOSIS — Z23 Encounter for immunization: Secondary | ICD-10-CM | POA: Diagnosis not present

## 2018-04-04 MED ORDER — CLONAZEPAM 0.5 MG PO TABS: ORAL_TABLET | ORAL | 5 refills | Status: DC

## 2018-04-04 MED ORDER — UMECLIDINIUM-VILANTEROL 62.5-25 MCG/INH IN AEPB: 1.0000 | INHALATION_SPRAY | Freq: Every day | RESPIRATORY_TRACT | 0 refills | Status: DC
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
## 2018-04-04 NOTE — Assessment & Plan Note (Signed)
He looks fit, strong and appropriate with no visible trauma.  I think he would rather find a treatment alternative to CPAP.  We discussed oral appliances. Plan-referral to learn about oral appliance therapy for OSA as an alternative to CPAP.

## 2018-04-04 NOTE — Patient Instructions (Signed)
Script printed refilling clonazepam for use to help sleep, if needed  Order- referral to orthodontist Dr Oneal Grout     Consider oral appliance for OSA  Keep trying to get back to using your CPAP if you can.

## 2018-04-04 NOTE — Assessment & Plan Note (Signed)
Clonazepam has helped insomnia and his REM behavior disorder Plan refill with discussion

## 2018-04-15 ENCOUNTER — Encounter: Payer: Medicare Other | Attending: Physical Medicine & Rehabilitation | Admitting: Physical Medicine & Rehabilitation

## 2018-04-15 ENCOUNTER — Encounter: Payer: Self-pay | Admitting: Physical Medicine & Rehabilitation

## 2018-04-15 ENCOUNTER — Other Ambulatory Visit: Payer: Self-pay

## 2018-04-15 VITALS — BP 136/77 | HR 55 | Ht 70.5 in | Wt 207.4 lb

## 2018-04-15 DIAGNOSIS — M47816 Spondylosis without myelopathy or radiculopathy, lumbar region: Secondary | ICD-10-CM | POA: Diagnosis not present

## 2018-04-15 DIAGNOSIS — R269 Unspecified abnormalities of gait and mobility: Secondary | ICD-10-CM

## 2018-04-15 DIAGNOSIS — M791 Myalgia, unspecified site: Secondary | ICD-10-CM

## 2018-04-15 DIAGNOSIS — M961 Postlaminectomy syndrome, not elsewhere classified: Secondary | ICD-10-CM | POA: Insufficient documentation

## 2018-04-15 DIAGNOSIS — M5441 Lumbago with sciatica, right side: Secondary | ICD-10-CM | POA: Diagnosis not present

## 2018-04-15 DIAGNOSIS — G8929 Other chronic pain: Secondary | ICD-10-CM | POA: Diagnosis not present

## 2018-04-15 DIAGNOSIS — M792 Neuralgia and neuritis, unspecified: Secondary | ICD-10-CM | POA: Diagnosis not present

## 2018-04-15 DIAGNOSIS — G479 Sleep disorder, unspecified: Secondary | ICD-10-CM | POA: Diagnosis not present

## 2018-04-15 DIAGNOSIS — G894 Chronic pain syndrome: Secondary | ICD-10-CM | POA: Diagnosis not present
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
## 2018-05-16 ENCOUNTER — Ambulatory Visit (HOSPITAL_BASED_OUTPATIENT_CLINIC_OR_DEPARTMENT_OTHER): Payer: Medicare Other | Admitting: Physical Medicine & Rehabilitation

## 2018-05-16 ENCOUNTER — Encounter: Payer: Self-pay | Admitting: Physical Medicine & Rehabilitation

## 2018-05-16 ENCOUNTER — Encounter: Payer: Medicare Other | Attending: Physical Medicine & Rehabilitation

## 2018-05-16 VITALS — BP 135/74 | HR 53 | Resp 14 | Ht 71.0 in | Wt 206.0 lb

## 2018-05-16 DIAGNOSIS — M5441 Lumbago with sciatica, right side: Secondary | ICD-10-CM | POA: Insufficient documentation

## 2018-05-16 DIAGNOSIS — M47816 Spondylosis without myelopathy or radiculopathy, lumbar region: Secondary | ICD-10-CM

## 2018-05-16 DIAGNOSIS — G8929 Other chronic pain: Secondary | ICD-10-CM | POA: Diagnosis not present

## 2018-05-16 DIAGNOSIS — M961 Postlaminectomy syndrome, not elsewhere classified: Secondary | ICD-10-CM | POA: Insufficient documentation

## 2018-05-16 DIAGNOSIS — G894 Chronic pain syndrome: Secondary | ICD-10-CM | POA: Diagnosis not present

## 2018-05-16 NOTE — Progress Notes (Signed)
  PROCEDURE RECORD [facility]   Name: Jacobus Colvin DOB:February 20, 1946 MRN: [id number]  Date:05/16/2018  Physician: Alysia Penna, MD    Nurse/CMA: Annelyse Rey, CMA   Allergies:  Allergies  Allergen Reactions  . Cymbalta [Duloxetine Hcl] Nausea And Vomiting  . Meloxicam Nausea And Vomiting    Consent Signed: Yes.    Is patient diabetic? No.  CBG today?  Pregnant: No. LMP: No LMP for male patient. (age 73-55)  Anticoagulants: no Anti-inflammatory: no Antibiotics: no  Procedure: rasdiofrequency  neurotomyPosition: Prone Start Time: 10:50am  End Time: 11:13am  Fluoro Time: 3  RN/CMA Chelsia Serres, CMA Korianna Washer, CMA    Time 10:44am 11:14am    BP 135/74 150/70    Pulse 53 47    Respirations 14 14    O2 Sat 98 98    S/S 6 6    Pain Level 6/10 2/10     D/C home with wife patient A & O X 3, D/C instructions reviewed, and sits independently.

## 2018-05-16 NOTE — Patient Instructions (Signed)

## 2018-05-16 NOTE — Progress Notes (Signed)

## 2018-06-03 DIAGNOSIS — I6523 Occlusion and stenosis of bilateral carotid arteries: Secondary | ICD-10-CM | POA: Diagnosis not present

## 2018-06-03 DIAGNOSIS — I6501 Occlusion and stenosis of right vertebral artery: Secondary | ICD-10-CM | POA: Diagnosis not present

## 2018-06-03 DIAGNOSIS — Z87820 Personal history of traumatic brain injury: Secondary | ICD-10-CM | POA: Diagnosis not present

## 2018-06-03 DIAGNOSIS — Z8546 Personal history of malignant neoplasm of prostate: Secondary | ICD-10-CM | POA: Diagnosis not present

## 2018-06-03 DIAGNOSIS — R42 Dizziness and giddiness: Secondary | ICD-10-CM | POA: Diagnosis not present

## 2018-06-03 DIAGNOSIS — I6521 Occlusion and stenosis of right carotid artery: Secondary | ICD-10-CM | POA: Diagnosis not present

## 2018-06-03 DIAGNOSIS — G459 Transient cerebral ischemic attack, unspecified: Secondary | ICD-10-CM | POA: Diagnosis not present

## 2018-06-03 DIAGNOSIS — R270 Ataxia, unspecified: Secondary | ICD-10-CM | POA: Diagnosis not present

## 2018-06-03 DIAGNOSIS — Z981 Arthrodesis status: Secondary | ICD-10-CM | POA: Diagnosis not present

## 2018-06-03 DIAGNOSIS — Z79899 Other long term (current) drug therapy: Secondary | ICD-10-CM | POA: Diagnosis not present

## 2018-06-03 DIAGNOSIS — Z7982 Long term (current) use of aspirin: Secondary | ICD-10-CM | POA: Diagnosis not present

## 2018-06-08 DIAGNOSIS — Z9189 Other specified personal risk factors, not elsewhere classified: Secondary | ICD-10-CM | POA: Diagnosis not present

## 2018-06-08 DIAGNOSIS — Z9181 History of falling: Secondary | ICD-10-CM | POA: Diagnosis not present

## 2018-06-08 DIAGNOSIS — G459 Transient cerebral ischemic attack, unspecified: Secondary | ICD-10-CM | POA: Diagnosis not present

## 2018-06-08 DIAGNOSIS — Z6830 Body mass index (BMI) 30.0-30.9, adult: Secondary | ICD-10-CM | POA: Diagnosis not present

## 2018-06-15 DIAGNOSIS — R7303 Prediabetes: Secondary | ICD-10-CM | POA: Diagnosis not present

## 2018-06-15 DIAGNOSIS — E782 Mixed hyperlipidemia: Secondary | ICD-10-CM | POA: Diagnosis not present

## 2018-06-20 ENCOUNTER — Encounter: Payer: Medicare Other | Attending: Physical Medicine & Rehabilitation | Admitting: Registered Nurse

## 2018-06-20 ENCOUNTER — Encounter: Payer: Self-pay | Admitting: Registered Nurse

## 2018-06-20 VITALS — BP 119/78 | HR 69 | Ht 70.5 in | Wt 211.0 lb

## 2018-06-20 DIAGNOSIS — R202 Paresthesia of skin: Secondary | ICD-10-CM

## 2018-06-20 DIAGNOSIS — G8929 Other chronic pain: Secondary | ICD-10-CM | POA: Diagnosis not present

## 2018-06-20 DIAGNOSIS — M5441 Lumbago with sciatica, right side: Secondary | ICD-10-CM | POA: Diagnosis not present

## 2018-06-20 DIAGNOSIS — G894 Chronic pain syndrome: Secondary | ICD-10-CM | POA: Diagnosis not present

## 2018-06-20 DIAGNOSIS — M961 Postlaminectomy syndrome, not elsewhere classified: Secondary | ICD-10-CM

## 2018-06-20 DIAGNOSIS — M47816 Spondylosis without myelopathy or radiculopathy, lumbar region: Secondary | ICD-10-CM | POA: Diagnosis not present
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
## 2018-06-24 DIAGNOSIS — R7303 Prediabetes: Secondary | ICD-10-CM | POA: Diagnosis not present

## 2018-06-24 DIAGNOSIS — Z0000 Encounter for general adult medical examination without abnormal findings: Secondary | ICD-10-CM | POA: Diagnosis not present

## 2018-06-24 DIAGNOSIS — E782 Mixed hyperlipidemia: Secondary | ICD-10-CM | POA: Diagnosis not present

## 2018-06-24 DIAGNOSIS — F418 Other specified anxiety disorders: Secondary | ICD-10-CM | POA: Diagnosis not present

## 2018-06-27 DIAGNOSIS — S61211A Laceration without foreign body of left index finger without damage to nail, initial encounter: Secondary | ICD-10-CM | POA: Diagnosis not present

## 2018-06-27 DIAGNOSIS — Z23 Encounter for immunization: Secondary | ICD-10-CM | POA: Diagnosis not present

## 2018-06-27 DIAGNOSIS — S61310A Laceration without foreign body of right index finger with damage to nail, initial encounter: Secondary | ICD-10-CM | POA: Diagnosis not present

## 2018-07-12 DIAGNOSIS — S199XXA Unspecified injury of neck, initial encounter: Secondary | ICD-10-CM | POA: Diagnosis not present

## 2018-07-24 ENCOUNTER — Other Ambulatory Visit: Payer: Self-pay | Admitting: Internal Medicine

## 2018-07-25 NOTE — Telephone Encounter (Signed)
Rx refill request for Clonazepam 0.5 mg. CY please advise.   Last RX: 04/24/2018 Last OV 04/04/2018  Current Outpatient Medications on File Prior to Visit  Medication Sig Dispense Refill  . b complex vitamins tablet Take 1 tablet by mouth daily.    . Cholecalciferol 4000 UNITS CAPS Take 4,000 Units by mouth daily.    . citalopram (CELEXA) 10 MG tablet Take 10 mg by mouth daily.    . clonazePAM (KLONOPIN) 0.5 MG tablet TAKE 1 TO 3 TABLETS BY MOUTH EVERY NIGHT AT BEDTIME AS NEEDED FOR SLEEP 90 tablet 5  . clopidogrel (PLAVIX) 75 MG tablet Take 75 mg by mouth daily.    [name] ezetimibe (ZETIA) 10 MG tablet Take 10 mg by mouth daily.    . Multiple Vitamin (MULTIVITAMIN WITH MINERALS) TABS tablet Take 1 tablet by mouth daily.     No current facility-administered medications on file prior to visit.    Allergies  Allergen Reactions  . Cymbalta [Duloxetine Hcl] Nausea And Vomiting  . Meloxicam Nausea And Vomiting

## 2018-07-25 NOTE — Telephone Encounter (Signed)
Ok to refill total 6 months 

## 2018-07-27 ENCOUNTER — Encounter: Payer: Medicare Other | Attending: Physical Medicine & Rehabilitation | Admitting: Physical Medicine & Rehabilitation

## 2018-07-27 ENCOUNTER — Encounter: Payer: Self-pay | Admitting: Physical Medicine & Rehabilitation

## 2018-07-27 VITALS — BP 98/60 | HR 60 | Ht 70.5 in | Wt 208.0 lb

## 2018-07-27 DIAGNOSIS — M792 Neuralgia and neuritis, unspecified: Secondary | ICD-10-CM

## 2018-07-27 DIAGNOSIS — R269 Unspecified abnormalities of gait and mobility: Secondary | ICD-10-CM | POA: Diagnosis not present

## 2018-07-27 DIAGNOSIS — G479 Sleep disorder, unspecified: Secondary | ICD-10-CM

## 2018-07-27 DIAGNOSIS — M47816 Spondylosis without myelopathy or radiculopathy, lumbar region: Secondary | ICD-10-CM

## 2018-07-27 DIAGNOSIS — G894 Chronic pain syndrome: Secondary | ICD-10-CM | POA: Diagnosis not present

## 2018-07-27 DIAGNOSIS — M5441 Lumbago with sciatica, right side: Secondary | ICD-10-CM

## 2018-07-27 DIAGNOSIS — G8929 Other chronic pain: Secondary | ICD-10-CM

## 2018-07-27 DIAGNOSIS — M961 Postlaminectomy syndrome, not elsewhere classified: Secondary | ICD-10-CM | POA: Insufficient documentation

## 2018-07-27 DIAGNOSIS — I499 Cardiac arrhythmia, unspecified: Secondary | ICD-10-CM | POA: Diagnosis not present

## 2018-07-27 DIAGNOSIS — M47819 Spondylosis without myelopathy or radiculopathy, site unspecified: Secondary | ICD-10-CM | POA: Diagnosis not present

## 2018-07-27 NOTE — Addendum Note (Signed)
Addended by: Delice Lesch A on: 07/27/2018 11:54 AM   Modules accepted: Orders
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
## 2018-08-17 ENCOUNTER — Telehealth: Payer: Self-pay | Admitting: Internal Medicine

## 2018-08-17 NOTE — Telephone Encounter (Signed)
Spoke with patient's wife. She stated that the patient needed another CPAP mask, he lost his current one. Verified that the patient was still using [facility] as his DME. I gave the wife the number to [facility] to order new supplies, she verbalized understanding. Nothing further needed at time of call.

## 2018-08-30 ENCOUNTER — Encounter: Payer: Self-pay | Admitting: Cardiovascular Disease

## 2018-08-30 ENCOUNTER — Ambulatory Visit (INDEPENDENT_AMBULATORY_CARE_PROVIDER_SITE_OTHER): Payer: Medicare Other | Admitting: Cardiovascular Disease

## 2018-08-30 VITALS — BP 118/72 | HR 68 | Ht 70.5 in | Wt 205.0 lb

## 2018-08-30 DIAGNOSIS — R001 Bradycardia, unspecified: Secondary | ICD-10-CM | POA: Insufficient documentation

## 2018-08-30 NOTE — Patient Instructions (Signed)
Medication Instructions:  Your physician recommends that you continue on your current medications as directed. Please refer to the Current Medication list given to you today.  If you need a refill on your cardiac medications before your next appointment, please call your pharmacy.   Lab work: none If you have labs (blood work) drawn today and your tests are completely normal, you will receive your results only by: [name] MyChart Message (if you have MyChart) OR . A paper copy in the mail If you have any lab test that is abnormal or we need to change your treatment, we will call you to review the results.  Testing/Procedures: Your physician has recommended that you wear an event monitor. Event monitors are medical devices that record the heart's electrical activity. Doctors most often [place] these monitors to diagnose arrhythmias. Arrhythmias are problems with the speed or rhythm of the heartbeat. The monitor is a small, portable device. You can wear one while you do your normal daily activities. This is usually used to diagnose what is causing palpitations/syncope (passing out).  Your physician has requested that you have an echocardiogram. Echocardiography is a painless test that uses sound waves to create images of your heart. It provides your doctor with information about the size and shape of your heart and how well your heart's chambers and valves are working. This procedure takes approximately one hour. There are no restrictions for this procedure.    Follow-Up: At [facility], you and your health needs are our priority.  As part of our continuing mission to provide you with exceptional heart care, we have created designated Provider Care Teams.  These Care Teams include your primary Cardiologist (physician) and Advanced Practice Providers (APPs -  Physician Assistants and Nurse Practitioners) who all work together to provide you with the care you need, when you need it.  . Follow up with Dr.  Gwenlyn Found pending the results of your testing.  Any Other Special Instructions Will Be Listed Below (If Applicable).

## 2018-08-30 NOTE — Progress Notes (Signed)
08/30/2018 Caleb Woodward   Mar 07, 1946  [id number]  Primary Physician Maryella Shivers, MD Primary Cardiologist: Lorretta Harp MD Lupe Carney, [place]  HPI:  Caleb Woodward is a 72 y.o. only overweight although fit appearing married African-American male father of 5 biologic children and 1 adopted child, grandfather to 64 grandchildren who is retired Development worker, community and was referred by Dr. Posey Pronto for cardiovascular evaluation because of asymptomatic bradycardia.  His only risk factor includes hyperlipidemia.  He is statin intolerant.  He is never had a heart attack but has had a TIA in the past with loss of vision for several hours.  There is no family history of heart disease.  He denies chest pain or shortness of breath.  He was found to be bradycardic fortuitously during ER visit for finger laceration but has no symptoms referable to this.   No outpatient medications have been marked as taking for the 08/30/18 encounter (Office Visit) with Lorretta Harp, MD.     Allergies  Allergen Reactions  . Cymbalta [Duloxetine Hcl] Nausea And Vomiting  . Meloxicam Nausea And Vomiting    Social History   Socioeconomic History  . Marital status: Married    Spouse name: Not on file  . Number of children: Not on file  . Years of education: Not on file  . Highest education level: Not on file  Occupational History  . Not on file  Social Needs  . Financial resource strain: Not on file  . Food insecurity:    Worry: Not on file    Inability: Not on file  . Transportation needs:    Medical: Not on file    Non-medical: Not on file  Tobacco Use  . Smoking status: Never Smoker  . Smokeless tobacco: Never Used  Substance and Sexual Activity  . Alcohol use: Yes    Comment: socially but rarely  . Drug use: No  . Sexual activity: Not on file  Lifestyle  . Physical activity:    Days per week: Not on file    Minutes per session: Not on file  . Stress: Not on file  Relationships  . Social  connections:    Talks on phone: Not on file    Gets together: Not on file    Attends religious service: Not on file    Active member of club or organization: Not on file    Attends meetings of clubs or organizations: Not on file    Relationship status: Not on file  . Intimate partner violence:    Fear of current or ex partner: Not on file    Emotionally abused: Not on file    Physically abused: Not on file    Forced sexual activity: Not on file  Other Topics Concern  . Not on file  Social History Narrative  . Not on file     Review of Systems: General: negative for chills, fever, night sweats or weight changes.  Cardiovascular: negative for chest pain, dyspnea on exertion, edema, orthopnea, palpitations, paroxysmal nocturnal dyspnea or shortness of breath Dermatological: negative for rash Respiratory: negative for cough or wheezing Urologic: negative for hematuria Abdominal: negative for nausea, vomiting, diarrhea, bright red blood per rectum, melena, or hematemesis Neurologic: negative for visual changes, syncope, or dizziness All other systems reviewed and are otherwise negative except as noted above.    Blood pressure 118/72, pulse 68, height 5' 10.5" (1.791 m), weight 205 lb (93 kg).  General appearance: alert and no distress  Neck: no adenopathy, no carotid bruit, no JVD, supple, symmetrical, trachea midline and thyroid not enlarged, symmetric, no tenderness/mass/nodules Lungs: clear to auscultation bilaterally Heart: regular rate and rhythm, S1, S2 normal, no murmur, click, rub or gallop Extremities: extremities normal, atraumatic, no cyanosis or edema Pulses: 2+ and symmetric Skin: Skin color, texture, turgor normal. No rashes or lesions Neurologic: Alert and oriented X 3, normal strength and tone. Normal symmetric reflexes. Normal coordination and gait  EKG sinus rhythm at 68 with occasional PVCs.  Personally reviewed this EKG.  ASSESSMENT AND PLAN:   Slow heart  rate Mr. Damron was referred to me by Dr. Posey Pronto for evaluation of asymptomatic bradycardia.  He basically has no cardiac risk factors other than hyperlipidemia intolerant to statin therapy.  He is in fairly good shape.  He was seen in the ER because of lacerated finger and was placed on telemetry at which point he was found to be bradycardic with heart rate in the 30s.  He has a smart bed at home which has shown average heart rates in the 50s.  He is never had syncope or presyncope.  He denies chest pain or shortness of breath.      Lorretta Harp MD FACP,FACC,FAHA, [facility] 08/30/2018 1:18 PM

## 2018-08-30 NOTE — Addendum Note (Signed)
Addended by: Newt Minion on: 08/30/2018 01:34 PM   Modules accepted: Orders

## 2018-08-30 NOTE — Assessment & Plan Note (Signed)
Caleb Woodward was referred to me by Dr. Posey Pronto for evaluation of asymptomatic bradycardia.  He basically has no cardiac risk factors other than hyperlipidemia intolerant to statin therapy.  He is in fairly good shape.  He was seen in the ER because of lacerated finger and was placed on telemetry at which point he was found to be bradycardic with heart rate in the 30s.  He has a smart bed at home which has shown average heart rates in the 50s.  He is never had syncope or presyncope.  He denies chest pain or shortness of breath.

## 2018-09-08 ENCOUNTER — Other Ambulatory Visit: Payer: Self-pay

## 2018-09-08 ENCOUNTER — Ambulatory Visit (INDEPENDENT_AMBULATORY_CARE_PROVIDER_SITE_OTHER): Payer: Medicare Other

## 2018-09-08 ENCOUNTER — Ambulatory Visit (HOSPITAL_COMMUNITY): Payer: Medicare Other | Attending: Cardiovascular Disease

## 2018-09-08 DIAGNOSIS — R001 Bradycardia, unspecified: Secondary | ICD-10-CM | POA: Diagnosis not present

## 2018-09-14 ENCOUNTER — Telehealth: Payer: Self-pay

## 2018-09-14 NOTE — Telephone Encounter (Signed)
-----   Message from Troy Sine, MD sent at 09/08/2018 11:15 PM EST ----- Normal EF at 60 to 65% ejection fraction; moderate LVH.  Grade 2 diastolic dysfunction.Mild elevation of PA pressure 36 mm.

## 2018-09-14 NOTE — Telephone Encounter (Signed)
Spoke with pt wife. DPR on file. Pt wife aware of echo results with verbal understanding.

## 2018-10-04 ENCOUNTER — Encounter: Payer: Self-pay | Admitting: Internal Medicine

## 2018-10-04 ENCOUNTER — Ambulatory Visit (INDEPENDENT_AMBULATORY_CARE_PROVIDER_SITE_OTHER): Payer: Medicare Other | Admitting: Internal Medicine

## 2018-10-04 VITALS — BP 156/78 | HR 75 | Ht 70.5 in | Wt 216.8 lb

## 2018-10-04 DIAGNOSIS — Z23 Encounter for immunization: Secondary | ICD-10-CM

## 2018-10-04 DIAGNOSIS — F5101 Primary insomnia: Secondary | ICD-10-CM | POA: Diagnosis not present

## 2018-10-04 DIAGNOSIS — G4733 Obstructive sleep apnea (adult) (pediatric): Secondary | ICD-10-CM

## 2018-10-04 NOTE — Assessment & Plan Note (Signed)
Clonazepam does help him consolidate sleep.  He still tries to go to bed at 7 PM with his wife and her schedule may be impacting his sleep to some extent. Plan-discussed sleep hygiene.  Okay to continue clonazepam.

## 2018-10-04 NOTE — Assessment & Plan Note (Signed)
Moderately severe obstructive sleep apnea.  He is not substantially overweight.  He tries but does not tolerate wearing CPAP enough to meet goals.  He might be a candidate for surgical options. Plan-referral to ENT.
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
## 2018-10-04 NOTE — Patient Instructions (Addendum)
Order- referral to Dr Melida Quitter, ENT  Dx OSA  For now we can continue CPAP auto 5-20  Order- flu vax senior

## 2018-10-08 DIAGNOSIS — R001 Bradycardia, unspecified: Secondary | ICD-10-CM | POA: Diagnosis not present

## 2018-10-27 ENCOUNTER — Other Ambulatory Visit: Payer: Self-pay | Admitting: Internal Medicine

## 2018-10-27 NOTE — Telephone Encounter (Signed)
LOV 12.03.19. Last filled 07/26/2018 0.5mg  1-3 tabs po #90n with 5 refills. QHS PRN for sleep.   Will route this to APP of the day as CY is not in clinic this morning.   EW please advise. Thanks.

## 2018-10-27 NOTE — Telephone Encounter (Signed)
PMP awareness tool accessed, no discrepancies found

## 2018-11-30 ENCOUNTER — Other Ambulatory Visit: Payer: Self-pay | Admitting: Primary Care

## 2018-12-02 NOTE — Telephone Encounter (Signed)
Clonazepam refill e-sent 

## 2018-12-02 NOTE — Telephone Encounter (Signed)
CY please advise if ok to refill. Last refill was 10/27/2018 by Geraldo Pitter quantity 90 with no refills. Patient was last seen on 10/27/2018.   Current Outpatient Medications on File Prior to Visit  Medication Sig Dispense Refill  . b complex vitamins tablet Take 1 tablet by mouth daily.    . Cholecalciferol 4000 UNITS CAPS Take 4,000 Units by mouth daily.    . citalopram (CELEXA) 10 MG tablet Take 10 mg by mouth daily.    . clonazePAM (KLONOPIN) 0.5 MG tablet TAKE 1 TO 3 TABLETS BY MOUTH EVERY NIGHT AT BEDTIME AS NEEDED FOR SLEEP 90 tablet 0  . clopidogrel (PLAVIX) 75 MG tablet Take 75 mg by mouth daily.    [name] ezetimibe (ZETIA) 10 MG tablet Take 10 mg by mouth daily.    . Multiple Vitamin (MULTIVITAMIN WITH MINERALS) TABS tablet Take 1 tablet by mouth daily.     No current facility-administered medications on file prior to visit.    Allergies  Allergen Reactions  . Cymbalta [Duloxetine Hcl] Nausea And Vomiting  . Meloxicam Nausea And Vomiting

## 2018-12-19 DIAGNOSIS — E782 Mixed hyperlipidemia: Secondary | ICD-10-CM | POA: Diagnosis not present

## 2018-12-19 DIAGNOSIS — R7303 Prediabetes: Secondary | ICD-10-CM | POA: Diagnosis not present

## 2018-12-26 DIAGNOSIS — R7303 Prediabetes: Secondary | ICD-10-CM | POA: Diagnosis not present

## 2018-12-26 DIAGNOSIS — F418 Other specified anxiety disorders: Secondary | ICD-10-CM | POA: Diagnosis not present

## 2018-12-26 DIAGNOSIS — E782 Mixed hyperlipidemia: Secondary | ICD-10-CM | POA: Diagnosis not present

## 2019-01-22 ENCOUNTER — Telehealth: Payer: Self-pay | Admitting: Physical Medicine & Rehabilitation

## 2019-01-23 MED ORDER — BACLOFEN 20 MG PO TABS: 20.0000 mg | ORAL_TABLET | Freq: Three times a day (TID) | ORAL | 2 refills | Status: DC | PRN

## 2019-01-23 NOTE — Telephone Encounter (Signed)
Recieved electronic medication refill request for Robaxin.  Medication no longer on patients formulary at all.  List of alternatives are as follows from the 2020 formulary:  Skeletal Muscle Relaxants:  Name                                                                                                 Tier Baclofen (Oral Tablet)                                                                       2  Chlorzoxazone (500MG  Oral Tablet)                                                3  Cyclobenzaprine HCl (10MG  Oral Tablet, 5MG  Oral Tablet)            2  Cyclobenzaprine HCl (7.5MG  Oral Tablet)                                       4  Dantrolene Sodium (Oral Capsule)                                                  4  Tizanidine HCl (Oral Tablet)                                                             2  Please advise on possible medication change for this patient.

## 2019-01-23 NOTE — Telephone Encounter (Signed)
We can prescribe him baclofen 20mg  3 times daily as needed.  Thanks.

## 2019-01-25 ENCOUNTER — Encounter: Payer: Medicare Other | Admitting: Physical Medicine & Rehabilitation

## 2019-01-30 DIAGNOSIS — Y998 Other external cause status: Secondary | ICD-10-CM | POA: Diagnosis not present

## 2019-01-30 DIAGNOSIS — S0091XA Abrasion of unspecified part of head, initial encounter: Secondary | ICD-10-CM | POA: Diagnosis not present

## 2019-01-30 DIAGNOSIS — S00412A Abrasion of left ear, initial encounter: Secondary | ICD-10-CM | POA: Diagnosis not present

## 2019-01-30 DIAGNOSIS — S0990XA Unspecified injury of head, initial encounter: Secondary | ICD-10-CM | POA: Diagnosis not present

## 2019-01-30 DIAGNOSIS — S01312A Laceration without foreign body of left ear, initial encounter: Secondary | ICD-10-CM | POA: Diagnosis not present

## 2019-01-30 DIAGNOSIS — Y92009 Unspecified place in unspecified non-institutional (private) residence as the place of occurrence of the external cause: Secondary | ICD-10-CM | POA: Diagnosis not present

## 2019-01-30 DIAGNOSIS — W01198A Fall on same level from slipping, tripping and stumbling with subsequent striking against other object, initial encounter: Secondary | ICD-10-CM | POA: Diagnosis not present

## 2019-02-01 ENCOUNTER — Ambulatory Visit: Payer: Medicare Other | Admitting: Physical Medicine & Rehabilitation

## 2019-02-02 ENCOUNTER — Other Ambulatory Visit: Payer: Self-pay

## 2019-02-02 ENCOUNTER — Encounter: Payer: Medicare Other | Attending: Physical Medicine & Rehabilitation | Admitting: Physical Medicine & Rehabilitation

## 2019-02-02 ENCOUNTER — Encounter: Payer: Self-pay | Admitting: Physical Medicine & Rehabilitation

## 2019-02-02 VITALS — BP 152/65 | HR 46 | Ht 70.5 in | Wt 200.0 lb

## 2019-02-02 DIAGNOSIS — M47819 Spondylosis without myelopathy or radiculopathy, site unspecified: Secondary | ICD-10-CM | POA: Diagnosis not present

## 2019-02-02 DIAGNOSIS — G894 Chronic pain syndrome: Secondary | ICD-10-CM | POA: Diagnosis not present

## 2019-02-02 DIAGNOSIS — M961 Postlaminectomy syndrome, not elsewhere classified: Secondary | ICD-10-CM | POA: Diagnosis not present

## 2019-02-02 DIAGNOSIS — M792 Neuralgia and neuritis, unspecified: Secondary | ICD-10-CM

## 2019-02-02 DIAGNOSIS — R269 Unspecified abnormalities of gait and mobility: Secondary | ICD-10-CM | POA: Diagnosis not present

## 2019-02-02 DIAGNOSIS — M5441 Lumbago with sciatica, right side: Secondary | ICD-10-CM

## 2019-02-02 DIAGNOSIS — G8929 Other chronic pain: Secondary | ICD-10-CM | POA: Diagnosis not present

## 2019-02-02 DIAGNOSIS — I499 Cardiac arrhythmia, unspecified: Secondary | ICD-10-CM
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
## 2019-02-24 ENCOUNTER — Other Ambulatory Visit: Payer: Self-pay | Admitting: Physical Medicine & Rehabilitation

## 2019-05-03 DIAGNOSIS — R7303 Prediabetes: Secondary | ICD-10-CM | POA: Diagnosis not present

## 2019-05-03 DIAGNOSIS — E782 Mixed hyperlipidemia: Secondary | ICD-10-CM | POA: Diagnosis not present

## 2019-05-10 DIAGNOSIS — Z6830 Body mass index (BMI) 30.0-30.9, adult: Secondary | ICD-10-CM | POA: Diagnosis not present

## 2019-05-10 DIAGNOSIS — I679 Cerebrovascular disease, unspecified: Secondary | ICD-10-CM | POA: Diagnosis not present

## 2019-05-10 DIAGNOSIS — R7303 Prediabetes: Secondary | ICD-10-CM | POA: Diagnosis not present

## 2019-05-10 DIAGNOSIS — E782 Mixed hyperlipidemia: Secondary | ICD-10-CM | POA: Diagnosis not present

## 2019-05-23 ENCOUNTER — Other Ambulatory Visit: Payer: Self-pay | Admitting: Internal Medicine

## 2019-05-23 NOTE — Telephone Encounter (Signed)
Is this appropriate for refill ? 

## 2019-05-23 NOTE — Telephone Encounter (Signed)
Clonazepam refill e-sent 

## 2019-07-09 ENCOUNTER — Other Ambulatory Visit: Payer: Self-pay | Admitting: Physical Medicine & Rehabilitation

## 2019-07-28 ENCOUNTER — Ambulatory Visit: Payer: Medicare Other | Admitting: Physical Medicine & Rehabilitation

## 2019-08-07 ENCOUNTER — Ambulatory Visit: Payer: Medicare Other | Admitting: Physical Medicine & Rehabilitation

## 2019-08-08 ENCOUNTER — Encounter: Payer: Self-pay | Admitting: Physical Medicine & Rehabilitation

## 2019-08-08 ENCOUNTER — Encounter: Payer: Medicare Other | Attending: Physical Medicine & Rehabilitation | Admitting: Physical Medicine & Rehabilitation

## 2019-08-08 ENCOUNTER — Other Ambulatory Visit: Payer: Self-pay

## 2019-08-08 VITALS — BP 161/82 | HR 64 | Temp 97.9°F | Ht 70.5 in | Wt 217.2 lb

## 2019-08-08 DIAGNOSIS — M5441 Lumbago with sciatica, right side: Secondary | ICD-10-CM | POA: Insufficient documentation

## 2019-08-08 DIAGNOSIS — G894 Chronic pain syndrome: Secondary | ICD-10-CM | POA: Diagnosis not present

## 2019-08-08 DIAGNOSIS — M47816 Spondylosis without myelopathy or radiculopathy, lumbar region: Secondary | ICD-10-CM | POA: Diagnosis not present

## 2019-08-08 DIAGNOSIS — M791 Myalgia, unspecified site: Secondary | ICD-10-CM | POA: Diagnosis not present

## 2019-08-08 DIAGNOSIS — M792 Neuralgia and neuritis, unspecified: Secondary | ICD-10-CM

## 2019-08-08 DIAGNOSIS — R269 Unspecified abnormalities of gait and mobility: Secondary | ICD-10-CM | POA: Diagnosis not present

## 2019-08-08 DIAGNOSIS — M47819 Spondylosis without myelopathy or radiculopathy, site unspecified: Secondary | ICD-10-CM | POA: Diagnosis not present

## 2019-08-08 DIAGNOSIS — G8929 Other chronic pain: Secondary | ICD-10-CM | POA: Diagnosis not present

## 2019-08-08 DIAGNOSIS — M961 Postlaminectomy syndrome, not elsewhere classified: Secondary | ICD-10-CM | POA: Diagnosis not present

## 2019-08-08 MED ORDER — AMITRIPTYLINE HCL 25 MG PO TABS: 25.0000 mg | ORAL_TABLET | Freq: Every day | ORAL | 1 refills | Status: DC

## 2019-08-08 MED ORDER — LIDOCAINE 5 % EX OINT: 1.0000 "application " | TOPICAL_OINTMENT | CUTANEOUS | 0 refills | Status: DC | PRN
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
## 2019-08-08 NOTE — Patient Instructions (Signed)
Please perform stretching exercises twice a day

## 2019-09-05 ENCOUNTER — Other Ambulatory Visit: Payer: Self-pay

## 2019-09-05 ENCOUNTER — Encounter: Payer: Medicare Other | Attending: Physical Medicine & Rehabilitation | Admitting: Physical Medicine & Rehabilitation

## 2019-09-05 ENCOUNTER — Encounter: Payer: Self-pay | Admitting: Physical Medicine & Rehabilitation

## 2019-09-05 ENCOUNTER — Ambulatory Visit: Payer: Medicare Other | Admitting: Physical Medicine & Rehabilitation

## 2019-09-05 VITALS — BP 174/96 | HR 78 | Temp 97.9°F | Ht 70.5 in | Wt 222.0 lb

## 2019-09-05 DIAGNOSIS — M722 Plantar fascial fibromatosis: Secondary | ICD-10-CM | POA: Diagnosis not present

## 2019-09-05 DIAGNOSIS — M47819 Spondylosis without myelopathy or radiculopathy, site unspecified: Secondary | ICD-10-CM | POA: Insufficient documentation

## 2019-09-05 DIAGNOSIS — R269 Unspecified abnormalities of gait and mobility: Secondary | ICD-10-CM | POA: Insufficient documentation

## 2019-09-05 DIAGNOSIS — G8929 Other chronic pain: Secondary | ICD-10-CM | POA: Insufficient documentation

## 2019-09-05 DIAGNOSIS — M5441 Lumbago with sciatica, right side: Secondary | ICD-10-CM | POA: Insufficient documentation

## 2019-09-05 DIAGNOSIS — M792 Neuralgia and neuritis, unspecified: Secondary | ICD-10-CM | POA: Insufficient documentation

## 2019-09-05 DIAGNOSIS — G894 Chronic pain syndrome: Secondary | ICD-10-CM

## 2019-09-05 DIAGNOSIS — M961 Postlaminectomy syndrome, not elsewhere classified: Secondary | ICD-10-CM | POA: Insufficient documentation

## 2019-09-05 DIAGNOSIS — M791 Myalgia, unspecified site: Secondary | ICD-10-CM | POA: Insufficient documentation

## 2019-09-05 DIAGNOSIS — M47816 Spondylosis without myelopathy or radiculopathy, lumbar region: Secondary | ICD-10-CM | POA: Diagnosis not present
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
## 2019-10-03 ENCOUNTER — Encounter: Payer: Self-pay | Admitting: Internal Medicine

## 2019-10-03 DIAGNOSIS — R7303 Prediabetes: Secondary | ICD-10-CM | POA: Diagnosis not present

## 2019-10-03 DIAGNOSIS — E782 Mixed hyperlipidemia: Secondary | ICD-10-CM | POA: Diagnosis not present

## 2019-10-04 ENCOUNTER — Telehealth: Payer: Self-pay | Admitting: *Deleted

## 2019-10-05 ENCOUNTER — Ambulatory Visit (INDEPENDENT_AMBULATORY_CARE_PROVIDER_SITE_OTHER): Payer: Medicare Other | Admitting: Internal Medicine

## 2019-10-05 ENCOUNTER — Other Ambulatory Visit: Payer: Self-pay

## 2019-10-05 ENCOUNTER — Encounter: Payer: Self-pay | Admitting: Internal Medicine

## 2019-10-05 VITALS — BP 130/74 | HR 75 | Temp 97.1°F | Ht 70.5 in | Wt 220.0 lb

## 2019-10-05 DIAGNOSIS — Z23 Encounter for immunization: Secondary | ICD-10-CM

## 2019-10-05 DIAGNOSIS — G4752 REM sleep behavior disorder: Secondary | ICD-10-CM

## 2019-10-05 DIAGNOSIS — G4733 Obstructive sleep apnea (adult) (pediatric): Secondary | ICD-10-CM | POA: Diagnosis not present
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
## 2019-10-05 NOTE — Patient Instructions (Signed)
Order- DME [facility]-  Please replace mask of choice- patient thinks he would like to try Full-face Continue auto 5-20, humidifier, supplies, AirView/ card  Please call if we can help  Order- Flu vax- senior

## 2019-10-09 DIAGNOSIS — E782 Mixed hyperlipidemia: Secondary | ICD-10-CM | POA: Diagnosis not present

## 2019-10-09 DIAGNOSIS — R7303 Prediabetes: Secondary | ICD-10-CM | POA: Diagnosis not present

## 2019-10-09 DIAGNOSIS — I10 Essential (primary) hypertension: Secondary | ICD-10-CM | POA: Diagnosis not present

## 2019-10-09 DIAGNOSIS — Z6830 Body mass index (BMI) 30.0-30.9, adult: Secondary | ICD-10-CM | POA: Diagnosis not present

## 2019-10-21 ENCOUNTER — Encounter: Payer: Self-pay | Admitting: Internal Medicine

## 2019-10-21 NOTE — Assessment & Plan Note (Signed)
Benefits. Reviewed compliance goals, comfort and sleep hygiene. Plan- continue auto 5-20, change mask.

## 2019-10-21 NOTE — Assessment & Plan Note (Signed)
Much less REM/ dream related sleep disturbance with clonazepam which has ben well- tolerated

## 2019-11-20 ENCOUNTER — Other Ambulatory Visit: Payer: Self-pay | Admitting: Physical Medicine & Rehabilitation

## 2019-11-28 ENCOUNTER — Encounter: Payer: Self-pay | Admitting: Physical Medicine & Rehabilitation

## 2019-11-28 ENCOUNTER — Encounter: Payer: Medicare Other | Attending: Physical Medicine & Rehabilitation | Admitting: Physical Medicine & Rehabilitation

## 2019-11-28 ENCOUNTER — Other Ambulatory Visit: Payer: Self-pay

## 2019-11-28 VITALS — BP 147/97 | HR 64 | Temp 97.2°F | Ht 70.5 in | Wt 214.0 lb

## 2019-11-28 DIAGNOSIS — G8929 Other chronic pain: Secondary | ICD-10-CM | POA: Diagnosis not present

## 2019-11-28 DIAGNOSIS — M792 Neuralgia and neuritis, unspecified: Secondary | ICD-10-CM | POA: Insufficient documentation

## 2019-11-28 DIAGNOSIS — M791 Myalgia, unspecified site: Secondary | ICD-10-CM | POA: Insufficient documentation

## 2019-11-28 DIAGNOSIS — M47816 Spondylosis without myelopathy or radiculopathy, lumbar region: Secondary | ICD-10-CM | POA: Diagnosis not present

## 2019-11-28 DIAGNOSIS — R269 Unspecified abnormalities of gait and mobility: Secondary | ICD-10-CM | POA: Diagnosis not present

## 2019-11-28 DIAGNOSIS — M47819 Spondylosis without myelopathy or radiculopathy, site unspecified: Secondary | ICD-10-CM | POA: Diagnosis not present

## 2019-11-28 DIAGNOSIS — M5441 Lumbago with sciatica, right side: Secondary | ICD-10-CM | POA: Insufficient documentation

## 2019-11-28 DIAGNOSIS — M961 Postlaminectomy syndrome, not elsewhere classified: Secondary | ICD-10-CM | POA: Insufficient documentation

## 2019-11-28 DIAGNOSIS — G894 Chronic pain syndrome: Secondary | ICD-10-CM
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
## 2019-12-04 DIAGNOSIS — Z136 Encounter for screening for cardiovascular disorders: Secondary | ICD-10-CM | POA: Diagnosis not present

## 2019-12-04 DIAGNOSIS — Z6830 Body mass index (BMI) 30.0-30.9, adult: Secondary | ICD-10-CM | POA: Diagnosis not present

## 2019-12-04 DIAGNOSIS — Z1331 Encounter for screening for depression: Secondary | ICD-10-CM | POA: Diagnosis not present

## 2019-12-04 DIAGNOSIS — Z139 Encounter for screening, unspecified: Secondary | ICD-10-CM | POA: Diagnosis not present

## 2019-12-04 DIAGNOSIS — K625 Hemorrhage of anus and rectum: Secondary | ICD-10-CM | POA: Diagnosis not present

## 2019-12-04 DIAGNOSIS — K649 Unspecified hemorrhoids: Secondary | ICD-10-CM | POA: Diagnosis not present

## 2019-12-04 DIAGNOSIS — Z1339 Encounter for screening examination for other mental health and behavioral disorders: Secondary | ICD-10-CM | POA: Diagnosis not present

## 2019-12-04 DIAGNOSIS — Z7189 Other specified counseling: Secondary | ICD-10-CM | POA: Diagnosis not present

## 2019-12-04 DIAGNOSIS — Z0000 Encounter for general adult medical examination without abnormal findings: Secondary | ICD-10-CM | POA: Diagnosis not present

## 2019-12-05 ENCOUNTER — Other Ambulatory Visit: Payer: Self-pay | Admitting: Internal Medicine

## 2019-12-05 ENCOUNTER — Ambulatory Visit: Payer: Medicare Other | Admitting: Physical Medicine & Rehabilitation

## 2019-12-05 NOTE — Telephone Encounter (Signed)
Clonazepam refill e-sent 

## 2019-12-05 NOTE — Telephone Encounter (Signed)
Dr. Annamaria Boots, Please advise on refill for Clonazepam 0.5mg , 1-3 tablets at bedtime as needed for sleep. I have pended the medication order. Last refill sent 05/23/19, #90 with 5 refills. Last office visit 10/15/19, next office visit 10/04/20.

## 2019-12-12 ENCOUNTER — Encounter: Payer: Medicare Other | Attending: Physical Medicine & Rehabilitation | Admitting: Physical Medicine & Rehabilitation

## 2019-12-12 ENCOUNTER — Encounter: Payer: Self-pay | Admitting: Physical Medicine & Rehabilitation

## 2019-12-12 ENCOUNTER — Other Ambulatory Visit: Payer: Self-pay

## 2019-12-12 VITALS — BP 164/82 | HR 63 | Temp 98.4°F | Ht 70.0 in | Wt 212.0 lb

## 2019-12-12 DIAGNOSIS — G8929 Other chronic pain: Secondary | ICD-10-CM | POA: Diagnosis not present

## 2019-12-12 DIAGNOSIS — M961 Postlaminectomy syndrome, not elsewhere classified: Secondary | ICD-10-CM | POA: Diagnosis not present

## 2019-12-12 DIAGNOSIS — M5441 Lumbago with sciatica, right side: Secondary | ICD-10-CM | POA: Diagnosis not present

## 2019-12-12 DIAGNOSIS — R269 Unspecified abnormalities of gait and mobility: Secondary | ICD-10-CM | POA: Insufficient documentation

## 2019-12-12 DIAGNOSIS — M47816 Spondylosis without myelopathy or radiculopathy, lumbar region: Secondary | ICD-10-CM

## 2019-12-12 DIAGNOSIS — G894 Chronic pain syndrome: Secondary | ICD-10-CM | POA: Insufficient documentation

## 2019-12-12 DIAGNOSIS — M791 Myalgia, unspecified site: Secondary | ICD-10-CM | POA: Diagnosis not present

## 2019-12-12 DIAGNOSIS — M792 Neuralgia and neuritis, unspecified: Secondary | ICD-10-CM | POA: Diagnosis not present

## 2019-12-12 DIAGNOSIS — M47819 Spondylosis without myelopathy or radiculopathy, site unspecified: Secondary | ICD-10-CM | POA: Diagnosis not present

## 2019-12-12 NOTE — Patient Instructions (Signed)
Lumbar medial branch blocks were performed. This is to help diagnose the cause of the low back pain. It is important that you keep track of your pain for the first day or 2 after injection. This injection can give you temporary relief that lasts for hours or up to several months. There is no way to predict duration of pain relief.  Please try to compare your pain after injection to for the injection.  If this injection gives you  temporary relief there may be another longer-lasting procedure that may be beneficial call radiofrequency ablation   When you see Dr Posey Pronto, please discuss results with him, if this injection helps at least 50% then have him schedule a second set of Left L3-4-5 Lumbar medial branch blocks with me in March

## 2019-12-12 NOTE — Progress Notes (Signed)
  PROCEDURE RECORD [facility]   Name: Caleb Woodward DOB:[birth year] MRN: [id number]  Date:12/12/2019  Physician: Alysia Penna, MD    Nurse/CMA: Wessling, CMA  Allergies:  Allergies  Allergen Reactions  . Cymbalta [Duloxetine Hcl] Nausea And Vomiting  . Meloxicam Nausea And Vomiting    Consent Signed: Yes.    Is patient diabetic? No.  CBG today? NA  Pregnant: No. LMP: No LMP for male patient. (age 75-55)  Anticoagulants: no Anti-inflammatory: no Antibiotics: no  Procedure: Left L3-5 Medial Branch Block          Position: Prone   Start Time: 2:14pm  End Time: 2:24pm  Fluoro Time: 33s  RN/CMA Emon Miggins CMA Wessling, CMA    Time 135pm 2:29pm    BP 164/82 160/79    Pulse 63 65    Respirations 16 16    O2 Sat 98 96    S/S 6 6    Pain Level 7/10 3/10     D/C home with Hilda Blades Wife, patient A & O X 3, D/C instructions reviewed, and sits independently.

## 2019-12-12 NOTE — Progress Notes (Signed)
Left Lumbar L3, L4  medial branch blocks and L 5 dorsal ramus injection under fluoroscopic guidance   Indication: Left Lumbar pain which is not relieved by medication management or other conservative care and interfering with self-care and mobility.  Informed consent was obtained after describing risks and benefits of the procedure with the patient, this includes bleeding, bruising, infection, paralysis and medication side effects.  The patient wishes to proceed and has given written consent.  The patient was placed in a prone position.  The lumbar area was marked and prepped with Betadine.  One mL of 1% lidocaine was injected into each of 3 areas into the skin and subcutaneous tissue.  Then a 22-gauge 3.5in spinal needle was inserted targeting the junction of the left S1 superior articular process and sacral ala junction.  Needle was advanced under fluoroscopic guidance.  Bone contact was made. Isovue 200 was injected x 0.5 mL demonstrating no intravascular uptake.  Then a solution containing  2% MPF lidocaine was injected x 0.5 mL.  Then the left L5 superior articular process in transverse process junction was targeted.  Bone contact was made. Isovue 200 was injected x 0.5 mL demonstrating no intravascular uptake.  Then a solution containing 2% MPF lidocaine was injected x 0.5 mL.  Then the left L4 superior articular process in transverse process junction was targeted.  Bone contact was made.  Isovue 200 was injected x 0.5 mL demonstrating no intravascular uptake.  Then a solution containing  2% MPF lidocaine was injected x 0.5 mL.  Patient tolerated procedure well.  Post procedure instructions were given. 

## 2019-12-24 ENCOUNTER — Ambulatory Visit: Payer: Medicare Other | Attending: Internal Medicine

## 2019-12-24 DIAGNOSIS — Z23 Encounter for immunization: Secondary | ICD-10-CM | POA: Insufficient documentation

## 2019-12-24 NOTE — Progress Notes (Signed)
   Covid-19 Vaccination Clinic  Name:  Caleb Woodward    MRN: [id number] DOB: [birth year]  12/24/2019  [name] was observed post Covid-19 immunization for 15 minutes without incidence. He was provided with Vaccine Information Sheet and instruction to access the V-Safe system.   [name] was instructed to call 911 with any severe reactions post vaccine: [name] Difficulty breathing  . Swelling of your face and throat  . A fast heartbeat  . A bad rash all over your body  . Dizziness and weakness    Immunizations Administered    Name Date Dose VIS Date Route   Pfizer COVID-19 Vaccine 12/24/2019  3:10 PM 0.3 mL 10/13/2019 Intramuscular   Manufacturer: [employer]   Lot: [id number]   [place]: [id number]

## 2019-12-26 ENCOUNTER — Encounter (HOSPITAL_BASED_OUTPATIENT_CLINIC_OR_DEPARTMENT_OTHER): Payer: Medicare Other | Admitting: Physical Medicine & Rehabilitation

## 2019-12-26 ENCOUNTER — Encounter: Payer: Self-pay | Admitting: Physical Medicine & Rehabilitation

## 2019-12-26 ENCOUNTER — Other Ambulatory Visit: Payer: Self-pay

## 2019-12-26 VITALS — BP 167/82 | HR 72 | Temp 97.7°F | Ht 70.0 in | Wt 217.0 lb

## 2019-12-26 DIAGNOSIS — G8929 Other chronic pain: Secondary | ICD-10-CM | POA: Diagnosis not present

## 2019-12-26 DIAGNOSIS — M5441 Lumbago with sciatica, right side: Secondary | ICD-10-CM | POA: Diagnosis not present

## 2019-12-26 DIAGNOSIS — R269 Unspecified abnormalities of gait and mobility: Secondary | ICD-10-CM | POA: Diagnosis not present

## 2019-12-26 DIAGNOSIS — M961 Postlaminectomy syndrome, not elsewhere classified: Secondary | ICD-10-CM

## 2019-12-26 DIAGNOSIS — G894 Chronic pain syndrome: Secondary | ICD-10-CM

## 2019-12-26 DIAGNOSIS — M47816 Spondylosis without myelopathy or radiculopathy, lumbar region: Secondary | ICD-10-CM | POA: Diagnosis not present

## 2019-12-26 DIAGNOSIS — M722 Plantar fascial fibromatosis: Secondary | ICD-10-CM | POA: Diagnosis not present

## 2019-12-26 DIAGNOSIS — R202 Paresthesia of skin: Secondary | ICD-10-CM | POA: Diagnosis not present

## 2019-12-26 DIAGNOSIS — M47819 Spondylosis without myelopathy or radiculopathy, site unspecified: Secondary | ICD-10-CM

## 2019-12-26 DIAGNOSIS — M792 Neuralgia and neuritis, unspecified: Secondary | ICD-10-CM | POA: Diagnosis not present
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
## 2020-01-03 ENCOUNTER — Telehealth: Payer: Self-pay | Admitting: Internal Medicine

## 2020-01-03 NOTE — Telephone Encounter (Signed)
Spoke to Hoffman with [facility] (ph# [phone]) who advised pt was not eligible for a new mask until yesterday.  She will reach out to the patient today to arrange for a new mask.

## 2020-01-03 NOTE — Telephone Encounter (Signed)
Called and spoke to pt's wife, Caleb Woodward. She states the pt still has not received his new mask for his CPAP. Per pt's chart the order was sent to DME on 10/04/20 and it was received. ATC [facility], the line continued to ring with no prompts and then went to busy signal.   PCC's can you help with this? Do you have a direct contact number or person to reach? Triage can reach out if needed. Thanks.

## 2020-01-16 ENCOUNTER — Ambulatory Visit: Payer: Medicare Other | Attending: Internal Medicine

## 2020-01-16 DIAGNOSIS — Z23 Encounter for immunization: Secondary | ICD-10-CM

## 2020-01-16 NOTE — Progress Notes (Signed)
   Covid-19 Vaccination Clinic  Name:  Caleb Woodward    MRN: [id number] DOB: May 07, 1946  01/16/2020  Mr. [name] was observed post Covid-19 immunization for 15 minutes without incident. He was provided with Vaccine Information Sheet and instruction to access the V-Safe system.   Mr. [name] was instructed to call 911 with any severe reactions post vaccine: [name] Difficulty breathing  . Swelling of face and throat  . A fast heartbeat  . A bad rash all over body  . Dizziness and weakness   Immunizations Administered    Name Date Dose VIS Date Route   Pfizer COVID-19 Vaccine 01/16/2020  9:25 AM 0.3 mL 10/13/2019 Intramuscular   Manufacturer: [place]   Lot: [id number]   [place]: [id number]

## 2020-01-19 ENCOUNTER — Other Ambulatory Visit: Payer: Self-pay | Admitting: Physical Medicine & Rehabilitation

## 2020-02-01 ENCOUNTER — Other Ambulatory Visit: Payer: Self-pay

## 2020-02-01 ENCOUNTER — Encounter: Payer: Self-pay | Admitting: Physical Medicine & Rehabilitation

## 2020-02-01 ENCOUNTER — Encounter: Payer: Medicare Other | Attending: Physical Medicine & Rehabilitation | Admitting: Physical Medicine & Rehabilitation

## 2020-02-01 VITALS — BP 153/76 | HR 73 | Temp 97.2°F | Ht 70.5 in | Wt 216.0 lb

## 2020-02-01 DIAGNOSIS — G894 Chronic pain syndrome: Secondary | ICD-10-CM

## 2020-02-01 DIAGNOSIS — M25561 Pain in right knee: Secondary | ICD-10-CM | POA: Diagnosis not present

## 2020-02-01 DIAGNOSIS — R269 Unspecified abnormalities of gait and mobility: Secondary | ICD-10-CM | POA: Insufficient documentation

## 2020-02-01 DIAGNOSIS — M47816 Spondylosis without myelopathy or radiculopathy, lumbar region: Secondary | ICD-10-CM | POA: Diagnosis not present

## 2020-02-01 DIAGNOSIS — M791 Myalgia, unspecified site: Secondary | ICD-10-CM | POA: Insufficient documentation

## 2020-02-01 DIAGNOSIS — G8929 Other chronic pain: Secondary | ICD-10-CM | POA: Diagnosis not present

## 2020-02-01 DIAGNOSIS — M25562 Pain in left knee: Secondary | ICD-10-CM | POA: Diagnosis not present

## 2020-02-01 DIAGNOSIS — M47819 Spondylosis without myelopathy or radiculopathy, site unspecified: Secondary | ICD-10-CM | POA: Diagnosis not present

## 2020-02-01 DIAGNOSIS — M961 Postlaminectomy syndrome, not elsewhere classified: Secondary | ICD-10-CM | POA: Insufficient documentation

## 2020-02-01 DIAGNOSIS — M5441 Lumbago with sciatica, right side: Secondary | ICD-10-CM | POA: Diagnosis not present

## 2020-02-01 DIAGNOSIS — M792 Neuralgia and neuritis, unspecified: Secondary | ICD-10-CM | POA: Insufficient documentation

## 2020-02-01 NOTE — Patient Instructions (Signed)
Please obtain Magnesium, K+, Vit B12 levels

## 2020-02-01 NOTE — Addendum Note (Signed)
Addended by: Delice Lesch A on: 02/01/2020 09:46 AM   Modules accepted: Orders
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
## 2020-02-16 ENCOUNTER — Telehealth: Payer: Self-pay | Admitting: Internal Medicine

## 2020-02-16 DIAGNOSIS — G4733 Obstructive sleep apnea (adult) (pediatric): Secondary | ICD-10-CM

## 2020-02-16 NOTE — Telephone Encounter (Signed)
Spoke with patient's wife Hilda Blades. She stated that the patient is established with [facility] for his CPAP supplies. They have been fighting with [employer] for the past few months in order to get new supplies. He has not been able to use his cpap machine due to not having a clean mask.   She wants to know if Dr. Annamaria Boots would be willing to sign off on a order for him to switch to a new DME company. I advised her that I would send a message to Dr. Annamaria Boots, she verbalized understanding.   Dr. Annamaria Boots, please advise. Thanks!

## 2020-02-17 NOTE — Telephone Encounter (Signed)
1) Ok to change DME for CPAP if he wants 2) The problem may be that he wants a replacement mask sooner than his insurance will cover. Or he may have a billing issue with [employer] [facility]. Can we call them and see if we can understand the problem. 3) Or we can give him a print script, if he wants to order his mask on-line, paying cash, and not involve the DME company this time. ([employer], [employer], etc).

## 2020-02-19 NOTE — Addendum Note (Signed)
Addended by: Jannette Spanner on: 02/19/2020 10:25 AM   Modules accepted: Orders

## 2020-02-19 NOTE — Telephone Encounter (Signed)
Spoke with pt's wife, she states they never received supplies from this company so she doesn't think that is the reason. She would still like to switch companies and refuses to buy online. Order placed for new DME. Nothing further is needed.

## 2020-02-29 ENCOUNTER — Other Ambulatory Visit: Payer: Self-pay

## 2020-02-29 ENCOUNTER — Encounter: Payer: Self-pay | Admitting: Physical Medicine & Rehabilitation

## 2020-02-29 ENCOUNTER — Encounter (HOSPITAL_BASED_OUTPATIENT_CLINIC_OR_DEPARTMENT_OTHER): Payer: Medicare Other | Admitting: Physical Medicine & Rehabilitation

## 2020-02-29 VITALS — BP 138/90 | HR 44 | Temp 97.3°F | Ht 70.5 in | Wt 212.0 lb

## 2020-02-29 DIAGNOSIS — M791 Myalgia, unspecified site: Secondary | ICD-10-CM | POA: Diagnosis not present

## 2020-02-29 DIAGNOSIS — M792 Neuralgia and neuritis, unspecified: Secondary | ICD-10-CM

## 2020-02-29 DIAGNOSIS — M25561 Pain in right knee: Secondary | ICD-10-CM | POA: Diagnosis not present

## 2020-02-29 DIAGNOSIS — M25562 Pain in left knee: Secondary | ICD-10-CM | POA: Diagnosis not present

## 2020-02-29 DIAGNOSIS — M47816 Spondylosis without myelopathy or radiculopathy, lumbar region: Secondary | ICD-10-CM

## 2020-02-29 DIAGNOSIS — M47819 Spondylosis without myelopathy or radiculopathy, site unspecified: Secondary | ICD-10-CM | POA: Diagnosis not present

## 2020-02-29 DIAGNOSIS — G894 Chronic pain syndrome: Secondary | ICD-10-CM

## 2020-02-29 DIAGNOSIS — M961 Postlaminectomy syndrome, not elsewhere classified: Secondary | ICD-10-CM
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
## 2020-02-29 NOTE — Patient Instructions (Signed)
Please follow up with  Mag, K+, Vit B12 labs  Please follow up for leg length discrepancy eval

## 2020-03-13 NOTE — Telephone Encounter (Signed)
Erroneous encounter

## 2020-03-26 ENCOUNTER — Ambulatory Visit: Payer: Medicare Other | Attending: Physical Medicine & Rehabilitation

## 2020-03-30 ENCOUNTER — Other Ambulatory Visit: Payer: Self-pay | Admitting: Physical Medicine & Rehabilitation

## 2020-06-05 ENCOUNTER — Other Ambulatory Visit: Payer: Self-pay | Admitting: Physical Medicine & Rehabilitation

## 2020-06-06 ENCOUNTER — Other Ambulatory Visit: Payer: Self-pay | Admitting: Internal Medicine

## 2020-06-10 ENCOUNTER — Telehealth: Payer: Self-pay | Admitting: Internal Medicine

## 2020-06-10 MED ORDER — CLONAZEPAM 0.5 MG PO TABS: ORAL_TABLET | ORAL | 5 refills | Status: DC

## 2020-06-10 NOTE — Telephone Encounter (Signed)
Dr Annamaria Boots, please advise on clonazepam refill  Last given #90 12/05/19 with 5 refills  Last ov 10/05/19 and next ov 10/04/20  Spouse aware msg sent to Dr Annamaria Boots for approval and will call her once refill has been approved

## 2020-06-10 NOTE — Telephone Encounter (Signed)
Clonazepam refill was sent to [facility]

## 2020-06-11 NOTE — Telephone Encounter (Signed)
Called and spoke with pt's wife Hilda Blades letting her know that [facility] refilled pt's clonazepam. Debra verbalized understanding. Nothing further needed.

## 2020-07-12 DIAGNOSIS — E785 Hyperlipidemia, unspecified: Secondary | ICD-10-CM | POA: Diagnosis not present

## 2020-07-12 DIAGNOSIS — R7303 Prediabetes: Secondary | ICD-10-CM | POA: Diagnosis not present

## 2020-07-12 DIAGNOSIS — Z125 Encounter for screening for malignant neoplasm of prostate: Secondary | ICD-10-CM | POA: Diagnosis not present

## 2020-07-17 DIAGNOSIS — E785 Hyperlipidemia, unspecified: Secondary | ICD-10-CM | POA: Diagnosis not present

## 2020-07-17 DIAGNOSIS — F4321 Adjustment disorder with depressed mood: Secondary | ICD-10-CM | POA: Diagnosis not present

## 2020-07-17 DIAGNOSIS — M1812 Unilateral primary osteoarthritis of first carpometacarpal joint, left hand: Secondary | ICD-10-CM | POA: Diagnosis not present

## 2020-07-17 DIAGNOSIS — Z23 Encounter for immunization: Secondary | ICD-10-CM | POA: Diagnosis not present

## 2020-07-17 DIAGNOSIS — M19022 Primary osteoarthritis, left elbow: Secondary | ICD-10-CM | POA: Diagnosis not present

## 2020-07-17 DIAGNOSIS — R7303 Prediabetes: Secondary | ICD-10-CM | POA: Diagnosis not present

## 2020-07-17 DIAGNOSIS — M19032 Primary osteoarthritis, left wrist: Secondary | ICD-10-CM | POA: Diagnosis not present

## 2020-07-17 DIAGNOSIS — M25532 Pain in left wrist: Secondary | ICD-10-CM | POA: Diagnosis not present

## 2020-07-20 DIAGNOSIS — G894 Chronic pain syndrome: Secondary | ICD-10-CM | POA: Diagnosis not present

## 2020-07-20 DIAGNOSIS — R079 Chest pain, unspecified: Secondary | ICD-10-CM | POA: Diagnosis not present

## 2020-07-20 DIAGNOSIS — R61 Generalized hyperhidrosis: Secondary | ICD-10-CM | POA: Diagnosis not present

## 2020-07-20 DIAGNOSIS — G4733 Obstructive sleep apnea (adult) (pediatric): Secondary | ICD-10-CM | POA: Diagnosis not present

## 2020-07-20 DIAGNOSIS — Z8673 Personal history of transient ischemic attack (TIA), and cerebral infarction without residual deficits: Secondary | ICD-10-CM | POA: Diagnosis not present

## 2020-07-20 DIAGNOSIS — Z981 Arthrodesis status: Secondary | ICD-10-CM | POA: Diagnosis not present

## 2020-07-20 DIAGNOSIS — S61012A Laceration without foreign body of left thumb without damage to nail, initial encounter: Secondary | ICD-10-CM | POA: Diagnosis not present

## 2020-07-20 DIAGNOSIS — R110 Nausea: Secondary | ICD-10-CM | POA: Diagnosis not present

## 2020-07-20 DIAGNOSIS — E785 Hyperlipidemia, unspecified: Secondary | ICD-10-CM | POA: Diagnosis not present

## 2020-07-20 DIAGNOSIS — Z7902 Long term (current) use of antithrombotics/antiplatelets: Secondary | ICD-10-CM | POA: Diagnosis not present

## 2020-07-20 DIAGNOSIS — Z7982 Long term (current) use of aspirin: Secondary | ICD-10-CM | POA: Diagnosis not present

## 2020-07-20 DIAGNOSIS — Z79899 Other long term (current) drug therapy: Secondary | ICD-10-CM | POA: Diagnosis not present

## 2020-07-20 DIAGNOSIS — E7800 Pure hypercholesterolemia, unspecified: Secondary | ICD-10-CM | POA: Diagnosis not present

## 2020-07-22 DIAGNOSIS — Z789 Other specified health status: Secondary | ICD-10-CM | POA: Diagnosis not present

## 2020-07-24 DIAGNOSIS — F4321 Adjustment disorder with depressed mood: Secondary | ICD-10-CM | POA: Diagnosis not present

## 2020-07-24 DIAGNOSIS — Z7689 Persons encountering health services in other specified circumstances: Secondary | ICD-10-CM | POA: Diagnosis not present

## 2020-07-24 DIAGNOSIS — S61012A Laceration without foreign body of left thumb without damage to nail, initial encounter: Secondary | ICD-10-CM | POA: Diagnosis not present

## 2020-07-24 DIAGNOSIS — R001 Bradycardia, unspecified: Secondary | ICD-10-CM | POA: Diagnosis not present

## 2020-08-02 DIAGNOSIS — S61012D Laceration without foreign body of left thumb without damage to nail, subsequent encounter: Secondary | ICD-10-CM | POA: Diagnosis not present

## 2020-08-02 DIAGNOSIS — Z6829 Body mass index (BMI) 29.0-29.9, adult: Secondary | ICD-10-CM | POA: Diagnosis not present

## 2020-08-02 DIAGNOSIS — R7303 Prediabetes: Secondary | ICD-10-CM | POA: Diagnosis not present

## 2020-08-02 DIAGNOSIS — R001 Bradycardia, unspecified: Secondary | ICD-10-CM | POA: Diagnosis not present

## 2020-08-02 DIAGNOSIS — E785 Hyperlipidemia, unspecified: Secondary | ICD-10-CM | POA: Diagnosis not present

## 2020-08-05 ENCOUNTER — Other Ambulatory Visit: Payer: Self-pay | Admitting: Physical Medicine & Rehabilitation

## 2020-08-27 ENCOUNTER — Encounter: Payer: Medicare Other | Admitting: Physical Medicine & Rehabilitation

## 2020-08-29 ENCOUNTER — Other Ambulatory Visit: Payer: Self-pay

## 2020-08-29 ENCOUNTER — Encounter: Payer: Self-pay | Admitting: Physical Medicine & Rehabilitation

## 2020-08-29 ENCOUNTER — Encounter: Payer: Medicare Other | Attending: Physical Medicine & Rehabilitation | Admitting: Physical Medicine & Rehabilitation

## 2020-08-29 VITALS — BP 135/84 | HR 63 | Temp 98.1°F | Ht 70.5 in | Wt 212.8 lb

## 2020-08-29 DIAGNOSIS — G894 Chronic pain syndrome: Secondary | ICD-10-CM | POA: Diagnosis not present

## 2020-08-29 DIAGNOSIS — M791 Myalgia, unspecified site: Secondary | ICD-10-CM | POA: Insufficient documentation

## 2020-08-29 DIAGNOSIS — M47816 Spondylosis without myelopathy or radiculopathy, lumbar region: Secondary | ICD-10-CM | POA: Insufficient documentation

## 2020-08-29 DIAGNOSIS — M792 Neuralgia and neuritis, unspecified: Secondary | ICD-10-CM | POA: Diagnosis not present

## 2020-08-29 DIAGNOSIS — M779 Enthesopathy, unspecified: Secondary | ICD-10-CM

## 2020-08-29 DIAGNOSIS — M961 Postlaminectomy syndrome, not elsewhere classified: Secondary | ICD-10-CM | POA: Diagnosis not present

## 2020-08-29 DIAGNOSIS — M47819 Spondylosis without myelopathy or radiculopathy, site unspecified: Secondary | ICD-10-CM | POA: Diagnosis not present

## 2020-08-29 DIAGNOSIS — M722 Plantar fascial fibromatosis: Secondary | ICD-10-CM | POA: Insufficient documentation

## 2020-08-29 MED ORDER — DICLOFENAC SODIUM 1 % EX GEL: 2.0000 g | Freq: Four times a day (QID) | CUTANEOUS | 1 refills | Status: DC
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
## 2020-09-02 DIAGNOSIS — E785 Hyperlipidemia, unspecified: Secondary | ICD-10-CM | POA: Diagnosis not present

## 2020-09-02 DIAGNOSIS — R001 Bradycardia, unspecified: Secondary | ICD-10-CM | POA: Diagnosis not present

## 2020-09-02 DIAGNOSIS — R7303 Prediabetes: Secondary | ICD-10-CM | POA: Diagnosis not present
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
## 2020-09-09 ENCOUNTER — Other Ambulatory Visit: Payer: Self-pay

## 2020-09-09 ENCOUNTER — Encounter: Payer: Self-pay | Admitting: General Practice

## 2020-09-09 ENCOUNTER — Ambulatory Visit (INDEPENDENT_AMBULATORY_CARE_PROVIDER_SITE_OTHER): Payer: Medicare Other | Admitting: General Practice

## 2020-09-09 VITALS — BP 128/74 | HR 65 | Ht 70.5 in | Wt 208.6 lb

## 2020-09-09 DIAGNOSIS — E782 Mixed hyperlipidemia: Secondary | ICD-10-CM

## 2020-09-09 DIAGNOSIS — R001 Bradycardia, unspecified: Secondary | ICD-10-CM | POA: Diagnosis not present

## 2020-09-09 NOTE — Patient Instructions (Addendum)
Medication Instructions:  The current medical regimen is effective;  continue present plan and medications as directed. Please refer to the Current Medication list given to you today.  *If you need a refill on your cardiac medications before your next appointment, please call your pharmacy*  Lab Work:   Testing/Procedures:  NONE    NONE  Special Instructions PLEASE CALL OUR OFFICE IF YOU HAVE ANY DIZZINESS, FAINTING OR ANY OTHER SYMPTOMS  PLEASE READ AND FOLLOW SALTY 6-ATTACHED-1,800mg  daily  PLEASE MAINTAIN PHYSICAL ACTIVITY AS TOLERATED  Follow-Up: Your next appointment:  12 month(s) In Person with Quay Burow, MD OR IF UNAVAILABLE JESSE CLEAVER, FNP-C Please call our office 2 months in advance to schedule this appointment   At [facility], you and your health needs are our priority.  As part of our continuing mission to provide you with exceptional heart care, we have created designated Provider Care Teams.  These Care Teams include your primary Cardiologist (physician) and Advanced Practice Providers (APPs -  Physician Assistants and Nurse Practitioners) who all work together to provide you with the care you need, when you need it.  We recommend signing up for the patient portal called "MyChart".  Sign up information is provided on this After Visit Summary.  MyChart is used to connect with patients for Virtual Visits (Telemedicine).  Patients are able to view lab/test results, encounter notes, upcoming appointments, etc.  Non-urgent messages can be sent to your provider as well.   To learn more about what you can do with MyChart, go to [email or web address].              6 SALTY THINGS TO AVOID     1,800MG  DAILY

## 2020-09-30 ENCOUNTER — Other Ambulatory Visit: Payer: Self-pay | Admitting: Physical Medicine & Rehabilitation

## 2020-10-02 DIAGNOSIS — R7303 Prediabetes: Secondary | ICD-10-CM | POA: Diagnosis not present

## 2020-10-02 DIAGNOSIS — E785 Hyperlipidemia, unspecified: Secondary | ICD-10-CM | POA: Diagnosis not present

## 2020-10-02 DIAGNOSIS — R001 Bradycardia, unspecified: Secondary | ICD-10-CM | POA: Diagnosis not present
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
## 2020-10-04 ENCOUNTER — Ambulatory Visit: Payer: Medicare Other | Admitting: Internal Medicine

## 2020-10-07 DIAGNOSIS — Z23 Encounter for immunization: Secondary | ICD-10-CM | POA: Diagnosis not present

## 2020-10-09 DIAGNOSIS — Z20822 Contact with and (suspected) exposure to covid-19: Secondary | ICD-10-CM | POA: Diagnosis not present

## 2020-10-16 DIAGNOSIS — Z981 Arthrodesis status: Secondary | ICD-10-CM | POA: Diagnosis not present

## 2020-10-16 DIAGNOSIS — M25562 Pain in left knee: Secondary | ICD-10-CM | POA: Diagnosis not present

## 2020-10-16 DIAGNOSIS — M25462 Effusion, left knee: Secondary | ICD-10-CM | POA: Diagnosis not present

## 2020-10-16 DIAGNOSIS — M7989 Other specified soft tissue disorders: Secondary | ICD-10-CM | POA: Diagnosis not present

## 2020-10-16 DIAGNOSIS — M1712 Unilateral primary osteoarthritis, left knee: Secondary | ICD-10-CM | POA: Diagnosis not present

## 2020-11-06 DIAGNOSIS — M23204 Derangement of unspecified medial meniscus due to old tear or injury, left knee: Secondary | ICD-10-CM | POA: Diagnosis not present

## 2020-11-06 DIAGNOSIS — M1712 Unilateral primary osteoarthritis, left knee: Secondary | ICD-10-CM | POA: Diagnosis not present

## 2020-11-26 ENCOUNTER — Other Ambulatory Visit: Payer: Self-pay | Admitting: Physical Medicine & Rehabilitation

## 2020-12-03 ENCOUNTER — Telehealth: Payer: Self-pay | Admitting: Internal Medicine

## 2020-12-03 ENCOUNTER — Other Ambulatory Visit: Payer: Self-pay | Admitting: Internal Medicine

## 2020-12-03 DIAGNOSIS — E785 Hyperlipidemia, unspecified: Secondary | ICD-10-CM | POA: Diagnosis not present

## 2020-12-03 DIAGNOSIS — R7303 Prediabetes: Secondary | ICD-10-CM | POA: Diagnosis not present

## 2020-12-03 DIAGNOSIS — R001 Bradycardia, unspecified: Secondary | ICD-10-CM | POA: Diagnosis not present

## 2020-12-03 MED ORDER — CLONAZEPAM 0.5 MG PO TABS
ORAL_TABLET | ORAL | 5 refills | Status: DC
Start: 2020-12-03 — End: 2021-05-20

## 2020-12-03 NOTE — Progress Notes (Signed)
Clonazepam refill e-sent 

## 2020-12-03 NOTE — Telephone Encounter (Signed)
Called and spoke with wife Hilda Blades who states that patient has been having to take 3 klonopin at night for him to sleep. She states the RX says 1-3 but he has tried the others and 3 is the magic number for him. She picked up RX today and patient is good on meds as of right now but prescription may need to be adjusted when there are 31 days in the month. Advised her that I would send this to Dr. Annamaria Boots to let him know as an FYI for future. Nothing further needed at this time.

## 2020-12-03 NOTE — Telephone Encounter (Signed)
Due to medication not being there for pick up . Medication listed below clonazePAM (KLONOPIN) 0.5 MG tablet [[id number]]  clonazePAM (KLONOPIN) 0.5 MG tablet [[id number]]

## 2020-12-05 ENCOUNTER — Ambulatory Visit (INDEPENDENT_AMBULATORY_CARE_PROVIDER_SITE_OTHER): Payer: Medicare Other | Admitting: Orthopedic Surgery

## 2020-12-05 ENCOUNTER — Other Ambulatory Visit: Payer: Self-pay

## 2020-12-05 ENCOUNTER — Ambulatory Visit (INDEPENDENT_AMBULATORY_CARE_PROVIDER_SITE_OTHER): Payer: Medicare Other

## 2020-12-05 DIAGNOSIS — M25562 Pain in left knee: Secondary | ICD-10-CM | POA: Diagnosis not present

## 2020-12-05 DIAGNOSIS — M25462 Effusion, left knee: Secondary | ICD-10-CM | POA: Diagnosis not present

## 2020-12-06 ENCOUNTER — Encounter: Payer: Self-pay | Admitting: Orthopedic Surgery

## 2020-12-06 ENCOUNTER — Ambulatory Visit
Admission: RE | Admit: 2020-12-06 | Discharge: 2020-12-06 | Disposition: A | Discharge status: Home / Self Care | Payer: Medicare Other | Source: Ambulatory Visit | Attending: Orthopedic Surgery | Admitting: Orthopedic Surgery

## 2020-12-06 DIAGNOSIS — M25562 Pain in left knee: Secondary | ICD-10-CM

## 2020-12-06 NOTE — Progress Notes (Signed)
Office Visit Note   Patient: Caleb Woodward           Date of Birth: [birth year]           MRN: [id number] Visit Date: 12/05/2020 Requested by: Maryella Shivers, MD [address] [place],  [place] [address] PCP: Maryella Shivers, MD  Subjective: Chief Complaint  Patient presents with  . Left Knee - Pain    HPI: Caleb Woodward is a 75 y.o. male who presents to the office complaining of left knee pain.  Patient notes pain for several months.  He notes back in December 2021 he was getting out of a truck as usual but 4 hours after that he began to notice significant knee pain and swelling.  He was seen in the ED where his knee was aspirated of 20 cc of fluid and the aspirate was sent for analysis which revealed no crystals and no significant cell count.  2 weeks later after that event, he was walking up stairs and planted with his left knee when he felt a pop as he pushed up with his left leg.  The pop was associated with severe pain and he had to use crutches for 2 days following this injury.  Since these injuries he has had constant aching medial/lateral/posterior left knee pain.  Denies any groin pain or radicular pain from his back.  He notes the knee feels weak and gives way on him sometimes but denies any locking symptoms.  He does note catching sensation.  He has difficulty with stairs and has to go downstairs backwards because he does not trust his knee.  Pain wakes him up at night.  He has no history of any left knee pain up until this point in his life.  Denies any history of diabetes.  He was seen after his popping injury by a provider who administered cortisone injection into the left knee.  He states that this provided 30 minutes of relief but no lasting relief past the 30 minutes.  Denies any history of left knee surgery.  He does have a history of multiple spine surgeries and epidural steroid injections.  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              ROS: All systems reviewed are negative as they relate to the chief complaint within the history of present illness.  Patient denies fevers or chills.  Assessment & Plan: Visit Diagnoses:  1. Left knee pain, unspecified chronicity     Plan: Patient is a 75 year old male who presents following left knee injury back in December 2021.  He has had persistent pain and constant aching that wakes him up at night since a injury where he planted and felt a pop in his left knee with difficulty weightbearing the following several days.  He has no history of any left knee pain up until this event.  Radiographs taken today show no significant degenerative changes or any acute osseous injury.  He does have an effusion on exam today and no ligamentous laxity.  History is suggestive of meniscal root tear.  Ordered MRI of the left knee for further evaluation.  He has had no improvement with conservative management including anti-inflammatories, exercise program, cortisone injection. Decision making at that time will revolve around arthroscopic debridement versus root  repair versus observation and possible later medial compartment knee replacement.  Follow-Up Instructions: No follow-ups on file.   Orders:  Orders Placed This Encounter  Procedures  . XR KNEE 3 VIEW LEFT  . MR Knee Left w/o contrast   No orders of the defined types were placed in this encounter.     Procedures: No procedures performed   Clinical Data: No additional findings.  Objective: Vital Signs: There were no vitals taken for this visit.  Physical Exam:  Constitutional: Patient appears well-developed HEENT:  Head: Normocephalic Eyes:EOM are normal Neck: Normal range of motion Cardiovascular: Normal rate Pulmonary/chest: Effort normal Neurologic: Patient is alert Skin: Skin is warm Psychiatric:  Patient has normal mood and affect  Ortho Exam: Ortho exam demonstrates left knee with positive effusion.  Tenderness over the medial and lateral joint lines.  No significant laxity to varus/valgus stress or anterior/posterior drawer.  No calf tenderness.  Negative Homans' sign.  Able to perform multiple straight leg raises with no extensor lag.  No tenderness over the quadricep tendon, patellar tendon, patella.  No pain with hip range of motion.  0 degrees of passive extension and greater than 90 degrees of flexion.  Specialty Comments:  No specialty comments available.  Imaging: No results found.   PMFS History: Patient Active Problem List   Diagnosis Date Noted  . Tendonitis 08/29/2020  . Plantar fasciitis 08/29/2020  . Neuropathic pain 08/29/2020  . Myalgia 02/29/2020  . Spondylosis without myelopathy or radiculopathy, lumbar region 12/26/2019  . Facet arthropathy 11/28/2019  . Failed back syndrome 09/05/2019  . Slow heart rate 08/30/2018  . Chronic pain syndrome 07/27/2018  . Obstructive sleep apnea 09/01/2017  . Insomnia 09/01/2017  . RBD (REM behavioral disorder) 09/01/2017  . Lumbar stenosis 09/15/2016  . Cervical stenosis of spinal canal 02/06/2014   Past Medical History:  Diagnosis Date  . Cancer [facility]) 2012   Prostate cancer  . Elevated cholesterol   . GERD (gastroesophageal reflux disease)   . H/O hiatal hernia   . Headache(784.0)    due to accident in the past  . Neuromuscular disorder ([place])    numbness and tingling    Family History  Problem Relation Age of Onset  . Diabetes Mother     Past Surgical History:  Procedure Laterality Date  . ANTERIOR CERVICAL DECOMP/DISCECTOMY FUSION N/A 02/06/2014   Procedure: CERVICAL FIVE TO CERVICAL SIX, CERVICAL SIX TO CERVICAL SEVEN  ANTERIOR CERVICAL DECOMPRESSION/DISCECTOMY FUSION 2 LEVELS;  Surgeon: Floyce Stakes, MD;  Location: [facility];  Service: Neurosurgery;  Laterality: N/A;  C5-6 C6-7 Anterior cervical  decompression/diskectomy/fusion  . COLONOSCOPY W/ BIOPSIES AND POLYPECTOMY    . LUMBAR LAMINECTOMY/DECOMPRESSION MICRODISCECTOMY Left 09/15/2016   Procedure: LEFT LUMBAR FOUR-FIVE, LUMBAR FIVE-SACRAL ONE MICRODISCECTOMY;  Surgeon: Leeroy Cha, MD;  Location: [place];  Service: Neurosurgery;  Laterality: Left;  LEFT L4-5 L5-S1 MICRODISCECTOMY  . PROSTATE SURGERY  11/27/2010  . ULNAR NERVE REPAIR Left 2014   Social History   Occupational History  . Not on file  Tobacco Use  . Smoking status: Never Smoker  . Smokeless tobacco: Never Used  Vaping Use  . Vaping Use: Never used  Substance and Sexual Activity  . Alcohol use: Yes    Comment: socially but rarely  . Drug use: No  . Sexual activity: Not on file

## 2020-12-09 NOTE — Progress Notes (Signed)
sent 
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
## 2020-12-10 ENCOUNTER — Encounter: Payer: Self-pay | Admitting: Internal Medicine

## 2020-12-10 ENCOUNTER — Ambulatory Visit (INDEPENDENT_AMBULATORY_CARE_PROVIDER_SITE_OTHER): Payer: Medicare Other | Admitting: Internal Medicine

## 2020-12-10 ENCOUNTER — Other Ambulatory Visit: Payer: Self-pay

## 2020-12-10 VITALS — BP 132/70 | HR 79 | Temp 97.9°F | Ht 70.5 in | Wt 210.6 lb

## 2020-12-10 DIAGNOSIS — G4733 Obstructive sleep apnea (adult) (pediatric): Secondary | ICD-10-CM | POA: Diagnosis not present

## 2020-12-10 DIAGNOSIS — G4752 REM sleep behavior disorder: Secondary | ICD-10-CM
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
## 2020-12-10 NOTE — Assessment & Plan Note (Signed)
Still has dreams involving struggle, fight or flight, but doesn't act them out and sleeps failry quietly using clonazepam 1.5 mg. Plan- discussed. Continue clonazepam/

## 2020-12-10 NOTE — Patient Instructions (Signed)
Order- schedule home sleep test  Dx OSA  Please call us about 2 weeks after your sleep test for results and recommendations. It may be appropriate then to refer to to learn more about oral appliances and the Inspire nerve stimulator as alternatives to CPAP

## 2020-12-12 ENCOUNTER — Ambulatory Visit: Payer: Medicare Other | Attending: Physical Medicine & Rehabilitation | Admitting: Physical Therapy

## 2020-12-12 ENCOUNTER — Other Ambulatory Visit: Payer: Self-pay

## 2020-12-12 ENCOUNTER — Encounter: Payer: Self-pay | Admitting: Physical Therapy

## 2020-12-12 DIAGNOSIS — M545 Low back pain, unspecified: Secondary | ICD-10-CM | POA: Diagnosis not present

## 2020-12-12 DIAGNOSIS — M25562 Pain in left knee: Secondary | ICD-10-CM

## 2020-12-12 DIAGNOSIS — G8929 Other chronic pain: Secondary | ICD-10-CM | POA: Diagnosis not present
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
## 2020-12-12 NOTE — Patient Instructions (Signed)
Access Code: [email or web address] URL: [email or web address] Date: 12/12/2020 Prepared by: Hilda Blades  Exercises  Hooklying Active Hamstring Stretch - 1 x daily - 7 x weekly - 1-2 sets - 10 reps Active Straight Leg Raise with Quad Set - 1 x daily - 7 x weekly - 3 sets - 10 reps Supine Bridge - 1 x daily - 7 x weekly - 3 sets - 10 reps

## 2020-12-19 ENCOUNTER — Other Ambulatory Visit: Payer: Self-pay

## 2020-12-19 ENCOUNTER — Ambulatory Visit (INDEPENDENT_AMBULATORY_CARE_PROVIDER_SITE_OTHER): Payer: Medicare Other | Admitting: Orthopedic Surgery

## 2020-12-19 DIAGNOSIS — M25562 Pain in left knee: Secondary | ICD-10-CM

## 2020-12-22 ENCOUNTER — Encounter: Payer: Self-pay | Admitting: Orthopedic Surgery
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
## 2020-12-26 ENCOUNTER — Ambulatory Visit: Payer: Medicare Other

## 2020-12-26 ENCOUNTER — Other Ambulatory Visit: Payer: Self-pay

## 2020-12-26 DIAGNOSIS — G8929 Other chronic pain: Secondary | ICD-10-CM

## 2020-12-26 DIAGNOSIS — M25562 Pain in left knee: Secondary | ICD-10-CM

## 2020-12-26 DIAGNOSIS — M545 Low back pain, unspecified: Secondary | ICD-10-CM

## 2020-12-26 NOTE — Therapy (Signed)
[place], [place], [address] Phone: [phone]   Fax:  [phone]  Physical Therapy Treatment  Patient Details  Name: Caleb Woodward MRN: [id number] Date of Birth: 01-04-1946 Referring Provider (PT): Jamse Arn, MD   Encounter Date: 12/26/2020   PT End of Session - 12/26/20 1922    Visit Number 2    Number of Visits 6    Date for PT Re-Evaluation 01/23/21    Authorization Type MCR/AARP - FOTO visit 6 and 10; PN visit 10; KX modifier visit 15    PT Start Time 1830    PT Stop Time 1911    PT Time Calculation (min) 41 min    Activity Tolerance Patient tolerated treatment well    Behavior During Therapy [facility] for tasks assessed/performed           Past Medical History:  Diagnosis Date  . Cancer [facility]) 2012   Prostate cancer  . Elevated cholesterol   . GERD (gastroesophageal reflux disease)   . H/O hiatal hernia   . Headache(784.0)    due to accident in the past  . Neuromuscular disorder ([place])    numbness and tingling    Past Surgical History:  Procedure Laterality Date  . ANTERIOR CERVICAL DECOMP/DISCECTOMY FUSION N/A 02/06/2014   Procedure: CERVICAL FIVE TO CERVICAL SIX, CERVICAL SIX TO CERVICAL SEVEN  ANTERIOR CERVICAL DECOMPRESSION/DISCECTOMY FUSION 2 LEVELS;  Surgeon: Floyce Stakes, MD;  [facility]: [facility];  Service: Neurosurgery;  Laterality: N/A;  C5-6 C6-7 Anterior cervical decompression/diskectomy/fusion  . COLONOSCOPY W/ BIOPSIES AND POLYPECTOMY    . LUMBAR LAMINECTOMY/DECOMPRESSION MICRODISCECTOMY Left 09/15/2016   Procedure: LEFT LUMBAR FOUR-FIVE, LUMBAR FIVE-SACRAL ONE MICRODISCECTOMY;  Surgeon: Leeroy Cha, MD;  Location: [place];  Service: Neurosurgery;  Laterality: Left;  LEFT L4-5 L5-S1 MICRODISCECTOMY  . PROSTATE SURGERY  11/27/2010  . ULNAR NERVE REPAIR Left 2014    There were no vitals filed for this visit.   Subjective Assessment - 12/26/20 1833    Subjective  "The back has been doing better. I got some topical medicine if it starts hurting, which helps give some relief. The knee is about a 2/10." Pt denies having radiating symptoms in LLE lately.    Limitations Walking    Patient Stated Goals get back to working out    Currently in Pain? Yes    Pain Score 2     Pain Location Knee    Pain Orientation Left    Pain Descriptors / Indicators Dull    Pain Onset More than a month ago    Pain Onset 1 to 4 weeks ago              [facility] PT Assessment - 12/26/20 0001      Assessment   Medical Diagnosis Tendonitis    Referring Provider (PT) Jamse Arn, MD                         [facility] Adult PT Treatment/Exercise - 12/26/20 0001      Exercises   Exercises Knee/Hip;Lumbar      Lumbar Exercises: Aerobic   Nustep L6 x 5 min LE only      Lumbar Exercises: Supine   Bent Knee Raise Limitations Alternating marches while maintaining low back against the mat x 30 (15x LE)      Knee/Hip Exercises: Stretches   Passive Hamstring Stretch Both;1 rep;20 seconds    Passive Hamstring Stretch  Limitations seated at EOM. Pt reports he has been regularly stretching and does not feel much of a stretch at this time      Knee/Hip Exercises: Standing   Forward Step Up Left;15 reps;Step Height: 4"    Step Down Right;15 reps;Step Height: 4"      Knee/Hip Exercises: Seated   Long Arc Quad Strengthening;Left;2 sets;15 reps    Long Arc Quad Weight 3 lbs.    Long [employer] Limitations 2nd set with isometric hip ADD (ball squeeze)    Sit to [employer] 20 reps;without UE support      Knee/Hip Exercises: Supine   Bridges Strengthening;Both;2 sets;10 reps    Bridges Limitations with B hip ABD against green theraband    Straight Leg Raises Strengthening;Left;20 reps      Knee/Hip Exercises: Prone   Hip Extension Strengthening;Left;15 reps                  PT Education - 12/26/20 1921    Education Details Reviewed HEP and to avoid planting  and twisting as well as deep L knee FL/deep squats    Person(s) Educated Patient    Methods Explanation;Demonstration;Verbal cues;Tactile cues    Comprehension Verbalized understanding;Returned demonstration            PT Short Term Goals - 12/12/20 1522      PT SHORT TERM GOAL #1   Title Pt will independent with initial HEP in order to help maintain progress made in PT    Time 3    Period Weeks    Status New    Target Date 01/02/21      PT SHORT TERM GOAL #2   Title PT will go over FOTO results with pt.    Time 3    Period Weeks    Status New    Target Date 01/02/21             PT Long Term Goals - 12/12/20 1523      PT LONG TERM GOAL #1   Title Pt will go from 46% to 54% on FOTO to show functional improvement.    Time 6    Period Weeks    Status New    Target Date 01/23/21      PT LONG TERM GOAL #2   Title Pt will be independent with advanced HEP in order to decrease catching in his L knee as well as decrease overall lumbar/knee pain    Time 6    Period Weeks    Status New    Target Date 01/23/21      PT LONG TERM GOAL #3   Title Pt will be able to return to performing daily exercise in order to maintain fitness without an increase in sx's.    Time 6    Period Weeks    Status New    Target Date 01/23/21                 Plan - 12/26/20 1923    Clinical Impression Statement Patient tolerated session well with no adverse effects or complaints of increased pain. He was able to perform step ups and step downs from 4" step with good control but explains that the stairs at his house are about 8", and he typically steps down backwards deliberately to avoid pain and further injury. He should benefit from continued skilled PT intervention to address deficits and progress towards returning to desired level of function and activity.  Personal Factors and Comorbidities Time since onset of injury/illness/exacerbation    Examination-Activity Limitations  Bend;Lift;Stairs;Squat    Examination-Participation Restrictions Yard Work;Cleaning;Community Activity    Stability/Clinical Decision Making Stable/Uncomplicated    Rehab Potential Good    PT Frequency 1x / week    PT Duration 6 weeks    PT Treatment/Interventions ADLs/Self Care Home Management;Aquatic Therapy;Electrical Stimulation;Moist Heat;Traction;Stair training;Functional mobility training;Therapeutic activities;Therapeutic exercise;Patient/family education;Manual techniques;Passive range of motion    PT Next Visit Plan Review and update HEP, increase TrA exercises, increase LE strengthening of L LE dependent on MRI discussion with Dr (if so start to incorporate more functional strengthening), L neural tension exercise progression    PT Home Exercise Plan [id number]    Consulted and Agree with Plan of Care Patient           Patient will benefit from skilled therapeutic intervention in order to improve the following deficits and impairments:  Decreased strength,Hypomobility,Pain,Decreased range of motion  Visit Diagnosis: Acute pain of left knee  Chronic bilateral low back pain, unspecified whether sciatica present     Problem List Patient Active Problem List   Diagnosis Date Noted  . Tendonitis 08/29/2020  . Plantar fasciitis 08/29/2020  . Neuropathic pain 08/29/2020  . Myalgia 02/29/2020  . Spondylosis without myelopathy or radiculopathy, lumbar region 12/26/2019  . Facet arthropathy 11/28/2019  . Failed back syndrome 09/05/2019  . Slow heart rate 08/30/2018  . Chronic pain syndrome 07/27/2018  . Obstructive sleep apnea 09/01/2017  . Insomnia 09/01/2017  . RBD (REM behavioral disorder) 09/01/2017  . Lumbar stenosis 09/15/2016  . Cervical stenosis of spinal canal 02/06/2014      Haydee Monica, PT, DPT 12/26/20 7:36 PM  [facility] [address] [place], [place], [address] Phone: [phone]   Fax:   [phone]  Name: Alfonse Garringer MRN: [id number] Date of Birth: [birth year]

## 2020-12-30 ENCOUNTER — Other Ambulatory Visit: Payer: Self-pay

## 2020-12-30 ENCOUNTER — Ambulatory Visit: Payer: Medicare Other

## 2020-12-30 DIAGNOSIS — G4733 Obstructive sleep apnea (adult) (pediatric): Secondary | ICD-10-CM

## 2020-12-31 DIAGNOSIS — R001 Bradycardia, unspecified: Secondary | ICD-10-CM | POA: Diagnosis not present

## 2020-12-31 DIAGNOSIS — R7303 Prediabetes: Secondary | ICD-10-CM | POA: Diagnosis not present

## 2020-12-31 DIAGNOSIS — E785 Hyperlipidemia, unspecified: Secondary | ICD-10-CM | POA: Diagnosis not present

## 2021-01-02 ENCOUNTER — Telehealth: Payer: Self-pay

## 2021-01-02 ENCOUNTER — Ambulatory Visit: Payer: Medicare Other | Attending: Physical Medicine & Rehabilitation

## 2021-01-02 DIAGNOSIS — M25562 Pain in left knee: Secondary | ICD-10-CM | POA: Insufficient documentation

## 2021-01-02 DIAGNOSIS — G8929 Other chronic pain: Secondary | ICD-10-CM | POA: Insufficient documentation

## 2021-01-02 DIAGNOSIS — M545 Low back pain, unspecified: Secondary | ICD-10-CM | POA: Insufficient documentation

## 2021-01-02 NOTE — Telephone Encounter (Signed)
Patient's wife answered home phone and reported that patient's appointment today was not on their calendar for today (regarding no show) but confirmed all future appointment dates and times. Informed her of attendance policy and will plan to discuss with patient at next appointment as well.   Haydee Monica, PT, DPT 01/02/21 11:19 AM

## 2021-01-09 ENCOUNTER — Other Ambulatory Visit: Payer: Self-pay

## 2021-01-09 ENCOUNTER — Ambulatory Visit: Payer: Medicare Other

## 2021-01-09 DIAGNOSIS — M25562 Pain in left knee: Secondary | ICD-10-CM

## 2021-01-09 DIAGNOSIS — G8929 Other chronic pain: Secondary | ICD-10-CM

## 2021-01-09 DIAGNOSIS — M545 Low back pain, unspecified: Secondary | ICD-10-CM | POA: Diagnosis not present

## 2021-01-09 NOTE — Therapy (Signed)
[place], [place], [address] Phone: [phone]   Fax:  [phone]  Physical Therapy Treatment  Patient Details  Name: Caleb Woodward MRN: [id number] Date of Birth: [birth year] Referring Provider (PT): Jamse Arn, MD   Encounter Date: 01/09/2021   PT End of Session - 01/09/21 0945    Visit Number 3    Number of Visits 6    Date for PT Re-Evaluation 01/23/21    Authorization Type MCR/AARP - FOTO visit 6 and 10; PN visit 10; KX modifier visit 15    PT Start Time 0957    PT Stop Time 1040    PT Time Calculation (min) 43 min    Activity Tolerance Patient tolerated treatment well    Behavior During Therapy [facility] for tasks assessed/performed           Past Medical History:  Diagnosis Date  . Cancer [facility]) 2012   Prostate cancer  . Elevated cholesterol   . GERD (gastroesophageal reflux disease)   . H/O hiatal hernia   . Headache(784.0)    due to accident in the past  . Neuromuscular disorder ([place])    numbness and tingling    Past Surgical History:  Procedure Laterality Date  . ANTERIOR CERVICAL DECOMP/DISCECTOMY FUSION N/A 02/06/2014   Procedure: CERVICAL FIVE TO CERVICAL SIX, CERVICAL SIX TO CERVICAL SEVEN  ANTERIOR CERVICAL DECOMPRESSION/DISCECTOMY FUSION 2 LEVELS;  Surgeon: Floyce Stakes, MD;  Location: [facility];  Service: Neurosurgery;  Laterality: N/A;  C5-6 C6-7 Anterior cervical decompression/diskectomy/fusion  . COLONOSCOPY W/ BIOPSIES AND POLYPECTOMY    . LUMBAR LAMINECTOMY/DECOMPRESSION MICRODISCECTOMY Left 09/15/2016   Procedure: LEFT LUMBAR FOUR-FIVE, LUMBAR FIVE-SACRAL ONE MICRODISCECTOMY;  Surgeon: Leeroy Cha, MD;  Location: [place];  Service: Neurosurgery;  Laterality: Left;  LEFT L4-5 L5-S1 MICRODISCECTOMY  . PROSTATE SURGERY  11/27/2010  . ULNAR NERVE REPAIR Left 2014    There were no vitals filed for this visit.   Subjective Assessment - 01/09/21 0944    Subjective  "I'm walking funny but not because of my knee - I accidentally put on two different shoes today, and one is my son's that is about 2 sizes too small. My knee pain is just there, but it's not bad. It stays around a 1/10 usually. I have started to ease back into my exercises on my [place] more. I'm not working as vigorously as I was, but I am able to do more."    Limitations Walking    Patient Stated Goals get back to working out    Currently in Pain? Yes    Pain Score 1     Pain Location Knee    Pain Orientation Left    Pain Descriptors / Indicators Dull    Pain Onset More than a month ago    Pain Onset 1 to 4 weeks ago              [facility] PT Assessment - 01/09/21 0001      Assessment   Medical Diagnosis Tendonitis    Referring Provider (PT) Jamse Arn, MD                         [facility] Adult PT Treatment/Exercise - 01/09/21 0001      Ambulation/Gait   Stairs Yes    Stairs Assistance 7: Independent    Stair Management Technique Alternating pattern    Gait Comments Alternating ascend 4" and  6" x 8 times each      Self-Care   Self-Care Other Self-Care Comments    Other Self-Care Comments  See patient education      Lumbar Exercises: Aerobic   Nustep L6 x 5 min LE only      Knee/Hip Exercises: Stretches   Passive Hamstring Stretch Both;1 rep;60 seconds    Passive Hamstring Stretch Limitations seated at EOM. Pt reports he has been regularly stretching and does not feel much of a stretch at this time      Knee/Hip Exercises: Standing   Heel Raises Both;20 reps    Forward Lunges Left;15 reps    Functional Squat 20 reps    SLS L SLS 4 x 15 seconds    Rebounder L SLS x 15 throws    Other Standing Knee Exercises Toe raises with slight knee flexion while leaning against wall x 20    Other Standing Knee Exercises Lateral stepping with blue band x 28 feet each direction      Knee/Hip Exercises: Seated   Sit to Sand 20 reps;without UE support    10# KB     Knee/Hip Exercises: Supine   Straight Leg Raises Strengthening;Left;20 reps    Straight Leg Raises Limitations 2# ankle weight      Knee/Hip Exercises: Sidelying   Hip ABduction Strengthening;Left;20 reps    Hip ABduction Limitations 2# ankle weight      Knee/Hip Exercises: Prone   Hip Extension Strengthening;Left;20 reps    Hip Extension Limitations 2# ankle weight                  PT Education - 01/09/21 0953    Education Details Reviewed HEP, reviewed FOTO and potential progress with PT    Person(s) Educated Patient    Methods Explanation;Demonstration;Tactile cues;Verbal cues    Comprehension Verbalized understanding;Returned demonstration            PT Short Term Goals - 01/09/21 2725      PT SHORT TERM GOAL #1   Title Pt will independent with initial HEP in order to help maintain progress made in PT    Time 3    Period Weeks    Status Achieved    Target Date 01/02/21      PT SHORT TERM GOAL #2   Title PT will go over FOTO results with pt.    Time 3    Period Weeks    Status Achieved    Target Date 01/02/21             PT Long Term Goals - 12/12/20 1523      PT LONG TERM GOAL #1   Title Pt will go from 46% to 54% on FOTO to show functional improvement.    Time 6    Period Weeks    Status New    Target Date 01/23/21      PT LONG TERM GOAL #2   Title Pt will be independent with advanced HEP in order to decrease catching in his L knee as well as decrease overall lumbar/knee pain    Time 6    Period Weeks    Status New    Target Date 01/23/21      PT LONG TERM GOAL #3   Title Pt will be able to return to performing daily exercise in order to maintain fitness without an increase in sx's.    Time 6    Period Weeks    Status New  Target Date 01/23/21                 Plan - 01/09/21 0945    Clinical Impression Statement Patient tolerated session with no adverse effects or complaints of increased pain. He was able to  negotiate 4" and 6" stairs with reciprocal patterna and no handrails with 1-2/10 L knee pain, but he expresses feeling like he has to ascend/descend very slowly to avoid increased pain when ascending with LLE or descending with RLE. He expressed pain level remaining the same throughout all CKC exercises today. Overall, he has progressed well and has been able to ease into his regular exercise routine but has not been able to return to his desired level of intensity.    Personal Factors and Comorbidities Time since onset of injury/illness/exacerbation    Examination-Activity Limitations Bend;Lift;Stairs;Squat    Examination-Participation Restrictions Yard Work;Cleaning;Community Activity    Stability/Clinical Decision Making Stable/Uncomplicated    Rehab Potential Good    PT Frequency 1x / week    PT Duration 6 weeks    PT Treatment/Interventions ADLs/Self Care Home Management;Aquatic Therapy;Electrical Stimulation;Moist Heat;Traction;Stair training;Functional mobility training;Therapeutic activities;Therapeutic exercise;Patient/family education;Manual techniques;Passive range of motion    PT Next Visit Plan Review and update HEP, increase TrA exercises, increase LE strengthening as tolerated, L neural tension exercise progression    PT Home Exercise Plan [id number]    Consulted and Agree with Plan of Care Patient           Patient will benefit from skilled therapeutic intervention in order to improve the following deficits and impairments:  Decreased strength,Hypomobility,Pain,Decreased range of motion  Visit Diagnosis: Acute pain of left knee  Chronic bilateral low back pain, unspecified whether sciatica present     Problem List Patient Active Problem List   Diagnosis Date Noted  . Tendonitis 08/29/2020  . Plantar fasciitis 08/29/2020  . Neuropathic pain 08/29/2020  . Myalgia 02/29/2020  . Spondylosis without myelopathy or radiculopathy, lumbar region 12/26/2019  . Facet arthropathy  11/28/2019  . Failed back syndrome 09/05/2019  . Slow heart rate 08/30/2018  . Chronic pain syndrome 07/27/2018  . Obstructive sleep apnea 09/01/2017  . Insomnia 09/01/2017  . RBD (REM behavioral disorder) 09/01/2017  . Lumbar stenosis 09/15/2016  . Cervical stenosis of spinal canal 02/06/2014     Haydee Monica, PT, DPT 01/09/21 11:00 AM  [facility] [address] [place], [place], [address] Phone: [phone]   Fax:  [phone]  Name: Caleb Woodward MRN: [id number] Date of Birth: [birth year]

## 2021-01-10 DIAGNOSIS — R7303 Prediabetes: Secondary | ICD-10-CM | POA: Diagnosis not present

## 2021-01-10 DIAGNOSIS — E785 Hyperlipidemia, unspecified: Secondary | ICD-10-CM | POA: Diagnosis not present

## 2021-01-16 ENCOUNTER — Ambulatory Visit: Payer: Medicare Other | Admitting: Physical Therapy

## 2021-01-16 ENCOUNTER — Encounter: Payer: Self-pay | Admitting: Physical Therapy

## 2021-01-16 ENCOUNTER — Other Ambulatory Visit: Payer: Self-pay

## 2021-01-16 DIAGNOSIS — M25562 Pain in left knee: Secondary | ICD-10-CM | POA: Diagnosis not present

## 2021-01-16 DIAGNOSIS — M545 Low back pain, unspecified: Secondary | ICD-10-CM | POA: Diagnosis not present

## 2021-01-16 DIAGNOSIS — G8929 Other chronic pain: Secondary | ICD-10-CM | POA: Diagnosis not present

## 2021-01-16 NOTE — Therapy (Addendum)
[place], [place], [address] Phone: [phone]   Fax:  [phone]  Physical Therapy Treatment / Discharge  Patient Details  Name: Caleb Woodward MRN: [id number] Date of Birth: Nov 01, 1946 Referring Provider (PT): Jamse Arn, MD   Encounter Date: 01/16/2021   PT End of Session - 01/16/21 1048     Visit Number 4    Number of Visits 6    Date for PT Re-Evaluation 01/23/21    Authorization Type MCR/AARP - FOTO visit 6 and 10; PN visit 10; KX modifier visit 15    PT Start Time 7412    PT Stop Time 1048    PT Time Calculation (min) 46 min    Activity Tolerance Patient tolerated treatment well    Behavior During Therapy [facility] for tasks assessed/performed             Past Medical History:  Diagnosis Date  . Cancer [facility]) 2012   Prostate cancer  . Elevated cholesterol   . GERD (gastroesophageal reflux disease)   . H/O hiatal hernia   . Headache(784.0)    due to accident in the past  . Neuromuscular disorder ([place])    numbness and tingling    Past Surgical History:  Procedure Laterality Date  . ANTERIOR CERVICAL DECOMP/DISCECTOMY FUSION N/A 02/06/2014   Procedure: CERVICAL FIVE TO CERVICAL SIX, CERVICAL SIX TO CERVICAL SEVEN  ANTERIOR CERVICAL DECOMPRESSION/DISCECTOMY FUSION 2 LEVELS;  Surgeon: Floyce Stakes, MD;  [facility]: [facility];  Service: Neurosurgery;  Laterality: N/A;  C5-6 C6-7 Anterior cervical decompression/diskectomy/fusion  . COLONOSCOPY W/ BIOPSIES AND POLYPECTOMY    . LUMBAR LAMINECTOMY/DECOMPRESSION MICRODISCECTOMY Left 09/15/2016   Procedure: LEFT LUMBAR FOUR-FIVE, LUMBAR FIVE-SACRAL ONE MICRODISCECTOMY;  Surgeon: Leeroy Cha, MD;  Location: [place];  Service: Neurosurgery;  Laterality: Left;  LEFT L4-5 L5-S1 MICRODISCECTOMY  . PROSTATE SURGERY  11/27/2010  . ULNAR NERVE REPAIR Left 2014    There were no vitals filed for this visit.   Subjective Assessment - 01/16/21 1007      Subjective The exercises are going well. I am slowly using my Bess Kinds machine more, but not like I used to. I can just tell that the knee and back pain is there, but it's not a lot.    Currently in Pain? Yes    Pain Score 2     Pain Location Knee    Pain Orientation Left    Pain Descriptors / Indicators Dull    Pain Score 1    Pain Location Back    Pain Descriptors / Indicators Dull                                               [facility] Adult PT Treatment/Exercise - 01/16/21 0001       Lumbar Exercises: Stretches   Passive Hamstring Stretch 3 reps;30 seconds;Right;Left      Lumbar Exercises: Aerobic   Recumbent Bike L2 x 5 min LE      Lumbar Exercises: Seated   Sit to Stand 10 reps;Other (comment)   2 sets   Sit to Stand Limitations 15#      Knee/Hip Exercises: Standing   Forward Lunges Both;10 reps    Forward Lunges Limitations cueing for proper form    Side Lunges Left;10 reps    Side Lunges Limitations cueing for proper  form    Forward Step Up Both;Step Height: 6";10 reps    Step Down Step Height: 4";10 reps;Left    Step Down Limitations retro step downs    Functional Squat 15 reps    Functional Squat Limitations 15#, goblet squats    Rebounder L SLS on foam x 25 throws    Other Standing Knee Exercises hip hinges x20 with dowel   cues to keep knees bent and not extended, cues to keep back flat and have most of the motion come from the hips   Other Standing Knee Exercises deadlifts x5 5# to 8" step   d/c, rounded of back despite cues, went to hip hinges instead                            PT Short Term Goals - 01/09/21 8338       PT SHORT TERM GOAL #1   Title Pt will independent with initial HEP in order to help maintain progress made in PT    Time 3    Period Weeks    Status Achieved    Target Date 01/02/21      PT SHORT TERM GOAL #2   Title PT will go over FOTO results with pt.    Time 3    Period Weeks    Status  Achieved    Target Date 01/02/21                PT Long Term Goals - 12/12/20 1523       PT LONG TERM GOAL #1   Title Pt will go from 46% to 54% on FOTO to show functional improvement.    Time 6    Period Weeks    Status New    Target Date 01/23/21      PT LONG TERM GOAL #2   Title Pt will be independent with advanced HEP in order to decrease catching in his L knee as well as decrease overall lumbar/knee pain    Time 6    Period Weeks    Status New    Target Date 01/23/21      PT LONG TERM GOAL #3   Title Pt will be able to return to performing daily exercise in order to maintain fitness without an increase in sx's.    Time 6    Period Weeks    Status New    Target Date 01/23/21                        Plan - 01/16/21 1331     Clinical Impression Statement Pt tolerated session well with no adverse effects. Session focused on progressed CKC LE strengthening exercises. Slight pain was noted on retrostep 4" step ups, but all other exercises were without pain. Pt was very cautious/slow on the step ups/downs. Continues to benefit from skilled PT in order to progress L LE strength and core stabilizaiton in order to return to working out, stair navigation, and return to PLOF.    PT Treatment/Interventions ADLs/Self Care Home Management;Aquatic Therapy;Electrical Stimulation;Moist Heat;Traction;Stair training;Functional mobility training;Therapeutic activities;Therapeutic exercise;Patient/family education;Manual techniques;Passive range of motion    PT Next Visit Plan Review and update HEP, increase TrA exercises, increase LE strengthening as tolerated, L neural tension exercise progression    PT Home Exercise Plan [id number]    Consulted and Agree with Plan of Care Patient  Patient will benefit from skilled therapeutic intervention in order to improve the following deficits and impairments:  Decreased strength,Hypomobility,Pain,Decreased range of  motion  Visit Diagnosis: Acute pain of left knee  Chronic bilateral low back pain, unspecified whether sciatica present      Problem List Patient Active Problem List   Diagnosis Date Noted  . Tendonitis 08/29/2020  . Plantar fasciitis 08/29/2020  . Neuropathic pain 08/29/2020  . Myalgia 02/29/2020  . Spondylosis without myelopathy or radiculopathy, lumbar region 12/26/2019  . Facet arthropathy 11/28/2019  . Failed back syndrome 09/05/2019  . Slow heart rate 08/30/2018  . Chronic pain syndrome 07/27/2018  . Obstructive sleep apnea 09/01/2017  . Insomnia 09/01/2017  . RBD (REM behavioral disorder) 09/01/2017  . Lumbar stenosis 09/15/2016  . Cervical stenosis of spinal canal 02/06/2014    Caleb Popp, SPT 01/16/2021, 4:39 PM  [facility] [address] [place], [place], [address] Phone: [phone]   Fax:  [phone]  Name: Tahsin Benyo MRN: [id number] Date of Birth: 1946-07-24   PHYSICAL THERAPY DISCHARGE SUMMARY  Visits from Start of Care: 4  Current functional level related to goals / functional outcomes: See above   Remaining deficits: See above   Education / Equipment: HEP Plan:                                                    Patient goals were partially met. Patient is being discharged due to not returning since the last visit.  ?????    Hilda Blades, PT, DPT, LAT, ATC 02/25/21  2:49 PM Phone: [phone] Fax: [phone]

## 2021-01-18 DIAGNOSIS — Z8546 Personal history of malignant neoplasm of prostate: Secondary | ICD-10-CM | POA: Diagnosis not present

## 2021-01-18 DIAGNOSIS — Z87820 Personal history of traumatic brain injury: Secondary | ICD-10-CM | POA: Diagnosis not present

## 2021-01-18 DIAGNOSIS — E7800 Pure hypercholesterolemia, unspecified: Secondary | ICD-10-CM | POA: Diagnosis not present

## 2021-01-18 DIAGNOSIS — M79661 Pain in right lower leg: Secondary | ICD-10-CM | POA: Diagnosis not present

## 2021-01-18 DIAGNOSIS — R7303 Prediabetes: Secondary | ICD-10-CM | POA: Diagnosis not present

## 2021-01-18 DIAGNOSIS — Z7982 Long term (current) use of aspirin: Secondary | ICD-10-CM | POA: Diagnosis not present

## 2021-01-18 DIAGNOSIS — R32 Unspecified urinary incontinence: Secondary | ICD-10-CM | POA: Diagnosis not present

## 2021-01-18 DIAGNOSIS — M545 Low back pain, unspecified: Secondary | ICD-10-CM | POA: Diagnosis not present

## 2021-01-18 DIAGNOSIS — Z7902 Long term (current) use of antithrombotics/antiplatelets: Secondary | ICD-10-CM | POA: Diagnosis not present

## 2021-01-20 ENCOUNTER — Encounter: Payer: Self-pay | Admitting: *Deleted

## 2021-01-20 ENCOUNTER — Telehealth: Payer: Self-pay | Admitting: *Deleted

## 2021-01-20 DIAGNOSIS — R7303 Prediabetes: Secondary | ICD-10-CM | POA: Diagnosis not present

## 2021-01-20 DIAGNOSIS — M79604 Pain in right leg: Secondary | ICD-10-CM | POA: Diagnosis not present

## 2021-01-20 DIAGNOSIS — M79661 Pain in right lower leg: Secondary | ICD-10-CM | POA: Diagnosis not present

## 2021-01-20 DIAGNOSIS — M7989 Other specified soft tissue disorders: Secondary | ICD-10-CM | POA: Diagnosis not present

## 2021-01-20 DIAGNOSIS — I10 Essential (primary) hypertension: Secondary | ICD-10-CM | POA: Diagnosis not present

## 2021-01-20 DIAGNOSIS — E785 Hyperlipidemia, unspecified: Secondary | ICD-10-CM | POA: Diagnosis not present

## 2021-01-20 NOTE — Telephone Encounter (Signed)
Sorry to hear.  If it is the same pain as before then we should schedule him to see Dr. Letta Pate for repeat injection.  If it is different, then he should follow-up in the ED for repeat imaging, or I can order it when I see him tomorrow. Thanks.

## 2021-01-20 NOTE — Telephone Encounter (Signed)
Mrs Tomey called today about Mr Fonda severe pain in his right leg. He was doing PT on Friday and it has become acutely worse since that time.  He was seen in the ED @ [facility] in care everywhere).  He was given gabapentin and oxycodone but not much has helped.  He has tried ice and that made it worse. Heat has been a better option. This am he had to see PCP for blood work and she reports it was hard for him to put his foot down on the floor to walk.  He has an appt with Dr Posey Pronto tomorrow @ 10:40.  His wife is in [place] and trying to support him in any way she can.  She will be unavailable after 10:00 but can be reached @ [phone] until then.

## 2021-01-20 NOTE — Telephone Encounter (Signed)
Opened in error

## 2021-01-20 NOTE — Telephone Encounter (Signed)
I spoke with Mr Caleb Woodward and he is headed to [place] [facility] for an ultrasound ordered by his primary care.  He says the pain right know is primarily in his calf, but he does believe the pain is related to his back as well,  He will see DR Posey Pronto in tomorrow at his appt.

## 2021-01-21 ENCOUNTER — Encounter: Payer: Self-pay | Admitting: Physical Medicine & Rehabilitation

## 2021-01-21 ENCOUNTER — Other Ambulatory Visit: Payer: Self-pay

## 2021-01-21 ENCOUNTER — Encounter: Payer: Medicare Other | Attending: Physical Medicine & Rehabilitation | Admitting: Physical Medicine & Rehabilitation

## 2021-01-21 VITALS — BP 142/82 | HR 67 | Temp 98.4°F | Ht 70.5 in | Wt 208.8 lb

## 2021-01-21 DIAGNOSIS — R269 Unspecified abnormalities of gait and mobility: Secondary | ICD-10-CM | POA: Diagnosis not present

## 2021-01-21 DIAGNOSIS — M961 Postlaminectomy syndrome, not elsewhere classified: Secondary | ICD-10-CM | POA: Insufficient documentation

## 2021-01-21 DIAGNOSIS — G4733 Obstructive sleep apnea (adult) (pediatric): Secondary | ICD-10-CM | POA: Diagnosis not present

## 2021-01-21 DIAGNOSIS — M47819 Spondylosis without myelopathy or radiculopathy, site unspecified: Secondary | ICD-10-CM | POA: Diagnosis not present

## 2021-01-21 DIAGNOSIS — M47816 Spondylosis without myelopathy or radiculopathy, lumbar region: Secondary | ICD-10-CM | POA: Insufficient documentation

## 2021-01-21 DIAGNOSIS — M792 Neuralgia and neuritis, unspecified: Secondary | ICD-10-CM | POA: Diagnosis not present

## 2021-01-21 DIAGNOSIS — G894 Chronic pain syndrome: Secondary | ICD-10-CM | POA: Diagnosis not present

## 2021-01-21 MED ORDER — TIZANIDINE HCL 4 MG PO TABS: 2.0000 mg | ORAL_TABLET | Freq: Three times a day (TID) | ORAL | 1 refills | Status: DC | PRN

## 2021-01-21 MED ORDER — GABAPENTIN 600 MG PO TABS: 600.0000 mg | ORAL_TABLET | Freq: Three times a day (TID) | ORAL | 1 refills | Status: DC

## 2021-01-21 MED ORDER — AMITRIPTYLINE HCL 25 MG PO TABS: 25.0000 mg | ORAL_TABLET | Freq: Every day | ORAL | 1 refills | Status: DC
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
## 2021-01-21 NOTE — Telephone Encounter (Signed)
Thanks

## 2021-01-23 ENCOUNTER — Ambulatory Visit: Payer: Medicare Other | Admitting: Physical Therapy

## 2021-01-23 DIAGNOSIS — H524 Presbyopia: Secondary | ICD-10-CM | POA: Diagnosis not present

## 2021-01-23 DIAGNOSIS — H2513 Age-related nuclear cataract, bilateral: Secondary | ICD-10-CM | POA: Diagnosis not present

## 2021-01-23 DIAGNOSIS — H52203 Unspecified astigmatism, bilateral: Secondary | ICD-10-CM | POA: Diagnosis not present

## 2021-01-26 ENCOUNTER — Ambulatory Visit (HOSPITAL_COMMUNITY)
Admission: RE | Admit: 2021-01-26 | Discharge: 2021-01-26 | Disposition: A | Discharge status: Home / Self Care | Payer: Medicare Other | Source: Ambulatory Visit | Attending: Physical Medicine & Rehabilitation | Admitting: Physical Medicine & Rehabilitation

## 2021-01-26 ENCOUNTER — Other Ambulatory Visit: Payer: Self-pay

## 2021-01-26 DIAGNOSIS — M792 Neuralgia and neuritis, unspecified: Secondary | ICD-10-CM | POA: Insufficient documentation

## 2021-01-26 DIAGNOSIS — M961 Postlaminectomy syndrome, not elsewhere classified: Secondary | ICD-10-CM | POA: Diagnosis not present

## 2021-01-26 DIAGNOSIS — M47816 Spondylosis without myelopathy or radiculopathy, lumbar region: Secondary | ICD-10-CM | POA: Diagnosis not present

## 2021-01-26 DIAGNOSIS — G894 Chronic pain syndrome: Secondary | ICD-10-CM | POA: Insufficient documentation

## 2021-01-26 DIAGNOSIS — M47819 Spondylosis without myelopathy or radiculopathy, site unspecified: Secondary | ICD-10-CM | POA: Diagnosis not present

## 2021-01-26 DIAGNOSIS — M545 Low back pain, unspecified: Secondary | ICD-10-CM | POA: Diagnosis not present

## 2021-01-26 MED ORDER — GADOBUTROL 1 MMOL/ML IV SOLN
9.0000 mL | Freq: Once | INTRAVENOUS | Status: AC | PRN
  Administered 2021-01-26: 9 mL via INTRAVENOUS

## 2021-01-27 ENCOUNTER — Telehealth: Payer: Self-pay | Admitting: *Deleted

## 2021-01-27 NOTE — Telephone Encounter (Signed)
Discussed with patient's wife (number on file.  Please have patient follow-up with Dr. Letta Pate for right L5 ESI at next available appointment.  Will refer to physical therapy after injection.  Driving restrictions conveyed to wife.  Patient will need to be informed to hold his NSAIDs prior to procedure.

## 2021-01-27 NOTE — Telephone Encounter (Signed)
Caleb Woodward called to ask Dr Posey Pronto to discuss the MRI results that was done yesterday.  [phone].

## 2021-01-28 ENCOUNTER — Telehealth: Payer: Self-pay | Admitting: *Deleted

## 2021-01-28 NOTE — Telephone Encounter (Signed)
Pre procedure form review by telephone with Mrs Caleb Woodward. Mr Caleb Woodward is not on a blood thinner and only takes naproxen prn so he will not need to hold naproxen. He does need to have a driver for this procedure L5 ESI. He is not a diabetic and needs only to wear comfortable clothing without belt and avoid eating for his 9:30 injection on 02/06/21 with Dr Letta Pate. She verbalized understanding of instructions.

## 2021-01-30 ENCOUNTER — Ambulatory Visit: Payer: Medicare Other | Admitting: Orthopedic Surgery

## 2021-01-30 ENCOUNTER — Encounter: Payer: Medicare Other | Admitting: Physical Therapy

## 2021-01-31 DIAGNOSIS — E785 Hyperlipidemia, unspecified: Secondary | ICD-10-CM | POA: Diagnosis not present

## 2021-01-31 DIAGNOSIS — I10 Essential (primary) hypertension: Secondary | ICD-10-CM | POA: Diagnosis not present

## 2021-02-06 ENCOUNTER — Other Ambulatory Visit: Payer: Self-pay

## 2021-02-06 ENCOUNTER — Encounter: Payer: Self-pay | Admitting: Physical Medicine & Rehabilitation

## 2021-02-06 ENCOUNTER — Encounter: Payer: Medicare Other | Attending: Physical Medicine & Rehabilitation | Admitting: Physical Medicine & Rehabilitation

## 2021-02-06 VITALS — BP 163/87 | HR 72 | Temp 97.8°F | Ht 70.5 in | Wt 210.0 lb

## 2021-02-06 DIAGNOSIS — G894 Chronic pain syndrome: Secondary | ICD-10-CM | POA: Insufficient documentation

## 2021-02-06 DIAGNOSIS — M62838 Other muscle spasm: Secondary | ICD-10-CM | POA: Diagnosis not present

## 2021-02-06 DIAGNOSIS — M5416 Radiculopathy, lumbar region: Secondary | ICD-10-CM | POA: Diagnosis not present

## 2021-02-06 DIAGNOSIS — M47819 Spondylosis without myelopathy or radiculopathy, site unspecified: Secondary | ICD-10-CM | POA: Diagnosis not present

## 2021-02-06 DIAGNOSIS — M961 Postlaminectomy syndrome, not elsewhere classified: Secondary | ICD-10-CM | POA: Diagnosis not present

## 2021-02-06 NOTE — Progress Notes (Signed)
RIght L5-S1 Lumbar transforaminal epidural steroid injection under fluoroscopic guidance with contrast enhancement  Indication: Lumbosacral radiculitis is not relieved by medication management or other conservative care and interfering with self-care and mobility.   Informed consent was obtained after describing risk and benefits of the procedure with the patient, this includes bleeding, bruising, infection, paralysis and medication side effects.  The patient wishes to proceed and has given written consent.  Patient was placed in prone position.  The lumbar area was marked and prepped with Betadine.  It was entered with a 25-gauge 1-1/2 inch needle and one mL of 1% lidocaine was injected into the skin and subcutaneous tissue.  Then a 22-gauge 3.5 in spinal needle was inserted into the RIght L5-S1 intervertebral foramen under AP, lateral, and oblique view.  Once needle tip was within the foramen on lateral views an dnor exceeding 6 o clock position on th epedical on AP viewed Isovue 200 was inected x 30ml Then a solution containing one mL of 10 mg per mL dexamethasone and 2 mL of 1% lidocaine was injected.  The patient tolerated procedure well.  Post procedure instructions were given.  Please see post procedure form.

## 2021-02-06 NOTE — Progress Notes (Signed)
  PROCEDURE RECORD [facility]   Name: Caleb Woodward DOB:02/05/1946 MRN: [id number]  Date:02/06/2021  Physician: Alysia Penna, MD    Nurse/CMA Jorja Loa MA  Allergies:  Allergies  Allergen Reactions  . Cymbalta [Duloxetine Hcl] Nausea And Vomiting  . Meloxicam Nausea And Vomiting    Consent Signed: Yes.    Is patient diabetic? No.  CBG today?N/A  Pregnant: No. LMP: No LMP for male patient. (age 76-55)  Anticoagulants: no Anti-inflammatory: no Antibiotics: no  Procedure:Right L5  Epidural Steriod Injection   Position: Prone Start Time: 9:50 am  End Time:9:57 am  Fluoro Tim: 23  RN/CMA Makenli Derstine MA Ayden Hardwick MA Radha Coggins MA   Time 9:21 AM 10:01 AM 10:05 AM   BP 163/87 183/81 175/83   Pulse 70 61 68   Respirations 16 16    O2 Sat 95 97    S/S 6 6    Pain Level 8/10 4/10     D/C home with Self- has family on-call, patient A & O X 3, D/C instructions reviewed, and sits independently.

## 2021-02-06 NOTE — Patient Instructions (Signed)
You had a radio frequency procedure today This was done to alleviate joint pain in your lumbar area We injected lidocaine which is a local anesthetic.  You may experience soreness at the injection sites. You may also experienced some irritation of the nerves that were heated I'm recommending ice for 30 minutes every 2 hours as needed for the next 24-48 hours   

## 2021-02-25 ENCOUNTER — Encounter: Payer: Medicare Other | Admitting: Physical Medicine & Rehabilitation

## 2021-02-27 ENCOUNTER — Other Ambulatory Visit: Payer: Self-pay

## 2021-02-27 ENCOUNTER — Encounter: Payer: Self-pay | Admitting: Registered Nurse

## 2021-02-27 ENCOUNTER — Encounter (HOSPITAL_BASED_OUTPATIENT_CLINIC_OR_DEPARTMENT_OTHER): Payer: Medicare Other | Admitting: Registered Nurse

## 2021-02-27 VITALS — BP 136/82 | HR 68 | Temp 98.3°F | Ht 70.5 in | Wt 209.4 lb

## 2021-02-27 DIAGNOSIS — G894 Chronic pain syndrome: Secondary | ICD-10-CM

## 2021-02-27 DIAGNOSIS — M5416 Radiculopathy, lumbar region: Secondary | ICD-10-CM

## 2021-02-27 DIAGNOSIS — M47819 Spondylosis without myelopathy or radiculopathy, site unspecified: Secondary | ICD-10-CM | POA: Diagnosis not present

## 2021-02-27 DIAGNOSIS — M62838 Other muscle spasm: Secondary | ICD-10-CM

## 2021-02-27 DIAGNOSIS — M961 Postlaminectomy syndrome, not elsewhere classified: Secondary | ICD-10-CM | POA: Diagnosis not present

## 2021-02-27 MED ORDER — AMITRIPTYLINE HCL 25 MG PO TABS: 25.0000 mg | ORAL_TABLET | Freq: Every day | ORAL | 2 refills | Status: DC

## 2021-02-27 MED ORDER — DICLOFENAC SODIUM 1 % EX GEL: 2.0000 g | Freq: Four times a day (QID) | CUTANEOUS | 1 refills | Status: DC
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
## 2021-03-02 DIAGNOSIS — E785 Hyperlipidemia, unspecified: Secondary | ICD-10-CM | POA: Diagnosis not present

## 2021-03-02 DIAGNOSIS — I10 Essential (primary) hypertension: Secondary | ICD-10-CM | POA: Diagnosis not present

## 2021-03-24 DIAGNOSIS — F481 Depersonalization-derealization syndrome: Secondary | ICD-10-CM | POA: Diagnosis not present

## 2021-03-24 DIAGNOSIS — Z6830 Body mass index (BMI) 30.0-30.9, adult: Secondary | ICD-10-CM | POA: Diagnosis not present

## 2021-03-24 DIAGNOSIS — R42 Dizziness and giddiness: Secondary | ICD-10-CM | POA: Diagnosis not present

## 2021-03-25 DIAGNOSIS — R42 Dizziness and giddiness: Secondary | ICD-10-CM | POA: Diagnosis not present

## 2021-04-02 DIAGNOSIS — R7303 Prediabetes: Secondary | ICD-10-CM | POA: Diagnosis not present

## 2021-04-02 DIAGNOSIS — E785 Hyperlipidemia, unspecified: Secondary | ICD-10-CM | POA: Diagnosis not present

## 2021-04-02 DIAGNOSIS — I10 Essential (primary) hypertension: Secondary | ICD-10-CM | POA: Diagnosis not present

## 2021-04-04 ENCOUNTER — Other Ambulatory Visit: Payer: Self-pay | Admitting: Physical Medicine & Rehabilitation

## 2021-04-04 DIAGNOSIS — R42 Dizziness and giddiness: Secondary | ICD-10-CM | POA: Diagnosis not present
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
## 2021-04-09 ENCOUNTER — Ambulatory Visit: Payer: Medicare Other | Admitting: Internal Medicine

## 2021-04-11 DIAGNOSIS — Z1331 Encounter for screening for depression: Secondary | ICD-10-CM | POA: Diagnosis not present

## 2021-04-11 DIAGNOSIS — R42 Dizziness and giddiness: Secondary | ICD-10-CM | POA: Diagnosis not present

## 2021-04-11 DIAGNOSIS — Z0000 Encounter for general adult medical examination without abnormal findings: Secondary | ICD-10-CM | POA: Diagnosis not present

## 2021-04-11 DIAGNOSIS — Z1339 Encounter for screening examination for other mental health and behavioral disorders: Secondary | ICD-10-CM | POA: Diagnosis not present

## 2021-04-11 DIAGNOSIS — Z7189 Other specified counseling: Secondary | ICD-10-CM | POA: Diagnosis not present

## 2021-04-11 DIAGNOSIS — Z1211 Encounter for screening for malignant neoplasm of colon: Secondary | ICD-10-CM | POA: Diagnosis not present

## 2021-04-11 DIAGNOSIS — Z6830 Body mass index (BMI) 30.0-30.9, adult: Secondary | ICD-10-CM | POA: Diagnosis not present

## 2021-04-11 DIAGNOSIS — Z136 Encounter for screening for cardiovascular disorders: Secondary | ICD-10-CM | POA: Diagnosis not present

## 2021-04-11 DIAGNOSIS — Z139 Encounter for screening, unspecified: Secondary | ICD-10-CM | POA: Diagnosis not present

## 2021-05-02 DIAGNOSIS — I10 Essential (primary) hypertension: Secondary | ICD-10-CM | POA: Diagnosis not present

## 2021-05-02 DIAGNOSIS — R7303 Prediabetes: Secondary | ICD-10-CM | POA: Diagnosis not present

## 2021-05-02 DIAGNOSIS — E785 Hyperlipidemia, unspecified: Secondary | ICD-10-CM | POA: Diagnosis not present
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
## 2021-05-20 ENCOUNTER — Ambulatory Visit (INDEPENDENT_AMBULATORY_CARE_PROVIDER_SITE_OTHER): Payer: Medicare Other | Admitting: Internal Medicine

## 2021-05-20 ENCOUNTER — Encounter: Payer: Self-pay | Admitting: Internal Medicine

## 2021-05-20 ENCOUNTER — Other Ambulatory Visit: Payer: Self-pay

## 2021-05-20 VITALS — BP 150/82 | HR 66 | Temp 97.8°F | Ht 70.5 in | Wt 213.2 lb

## 2021-05-20 DIAGNOSIS — G4733 Obstructive sleep apnea (adult) (pediatric): Secondary | ICD-10-CM | POA: Diagnosis not present

## 2021-05-20 DIAGNOSIS — G4752 REM sleep behavior disorder: Secondary | ICD-10-CM

## 2021-05-20 MED ORDER — CLONAZEPAM 0.5 MG PO TABS: ORAL_TABLET | ORAL | 5 refills | Status: DC

## 2021-05-20 NOTE — Assessment & Plan Note (Signed)
Clonazepam works well. Needs to have enough to take during 31-day months. Plan- change script to # 93/ month

## 2021-05-20 NOTE — Patient Instructions (Signed)
We have changed your clonazepam script so you get # 93 each month. That should allow you to take 3 tabs (1.5 mg) per night.  Order- referral to Dr Augustina Mood DDS    consider oral appliance to treat OSA  Please call if we can help

## 2021-05-20 NOTE — Assessment & Plan Note (Signed)
He didn't tolerate CPAP. With latest sleep study, he is willing to be referred to consider an oral appliance. Plan- refer to Augustina Mood, DDS

## 2021-05-27 DIAGNOSIS — U071 COVID-19: Secondary | ICD-10-CM | POA: Diagnosis not present

## 2021-05-27 DIAGNOSIS — Z20822 Contact with and (suspected) exposure to covid-19: Secondary | ICD-10-CM | POA: Diagnosis not present

## 2021-05-27 DIAGNOSIS — R519 Headache, unspecified: Secondary | ICD-10-CM | POA: Diagnosis not present

## 2021-05-29 ENCOUNTER — Other Ambulatory Visit: Payer: Self-pay

## 2021-05-29 ENCOUNTER — Encounter: Payer: Medicare Other | Attending: Registered Nurse | Admitting: Registered Nurse

## 2021-05-29 ENCOUNTER — Encounter: Payer: Self-pay | Admitting: Registered Nurse

## 2021-05-29 VITALS — BP 145/62 | HR 70 | Temp 97.1°F | Ht 70.5 in | Wt 220.0 lb

## 2021-05-29 DIAGNOSIS — M62838 Other muscle spasm: Secondary | ICD-10-CM

## 2021-05-29 DIAGNOSIS — G894 Chronic pain syndrome: Secondary | ICD-10-CM

## 2021-05-29 DIAGNOSIS — M961 Postlaminectomy syndrome, not elsewhere classified: Secondary | ICD-10-CM | POA: Diagnosis not present

## 2021-05-29 DIAGNOSIS — M47816 Spondylosis without myelopathy or radiculopathy, lumbar region: Secondary | ICD-10-CM

## 2021-05-29 DIAGNOSIS — M47819 Spondylosis without myelopathy or radiculopathy, site unspecified: Secondary | ICD-10-CM

## 2021-05-29 NOTE — Progress Notes (Signed)
Subjective:    Patient ID: Caleb Woodward, male    DOB: [birth year], 75 y.o.   MRN: [id number]  HPI: Caleb Woodward is a 75 y.o. male whose appointment was changed to a My- Chart Video Visit, he was diagnosed with COVID-19. Mr. Lillquist agrees with My-Chart Video visit and verbalizes understanding. He states his  pain is located in his lower back. He rates his pain 3. His current exercise regime is walking and performing stretching exercises.     Pain Inventory Average Pain 4 Pain Right Now 3 My pain is intermittent, tingling, and aching  In the last 24 hours, has pain interfered with the following? General activity 0 Relation with others 0 Enjoyment of life 4 What TIME of day is your pain at its worst? night Sleep (in general) Fair  Pain is worse with: sitting and inactivity Pain improves with: therapy/exercise and medication Relief from Meds: 6  Family History  Problem Relation Age of Onset   Diabetes Mother    Social History   Socioeconomic History   Marital status: Married    Spouse name: Not on file   Number of children: Not on file   Years of education: Not on file   Highest education level: Not on file  Occupational History   Not on file  Tobacco Use   Smoking status: Never   Smokeless tobacco: Never  Vaping Use   Vaping Use: Never used  Substance and Sexual Activity   Alcohol use: Yes    Comment: socially but rarely   Drug use: No   Sexual activity: Not on file  Other Topics Concern   Not on file  Social History Narrative   Not on file   Social Determinants of Health   Financial Resource Strain: Not on file  Food Insecurity: Not on file  Transportation Needs: Not on file  Physical Activity: Not on file  Stress: Not on file  Social Connections: Not on file   Past Surgical History:  Procedure Laterality Date   ANTERIOR CERVICAL DECOMP/DISCECTOMY FUSION N/A 02/06/2014   Procedure: CERVICAL FIVE TO CERVICAL SIX, CERVICAL SIX TO CERVICAL SEVEN  ANTERIOR  CERVICAL DECOMPRESSION/DISCECTOMY FUSION 2 LEVELS;  Surgeon: Floyce Stakes, MD;  Location: [facility];  Service: Neurosurgery;  Laterality: N/A;  C5-6 C6-7 Anterior cervical decompression/diskectomy/fusion   COLONOSCOPY W/ BIOPSIES AND POLYPECTOMY     LUMBAR LAMINECTOMY/DECOMPRESSION MICRODISCECTOMY Left 09/15/2016   Procedure: LEFT LUMBAR FOUR-FIVE, LUMBAR FIVE-SACRAL ONE MICRODISCECTOMY;  Surgeon: Leeroy Cha, MD;  Location: [place];  Service: Neurosurgery;  Laterality: Left;  LEFT L4-5 L5-S1 MICRODISCECTOMY   PROSTATE SURGERY  11/27/2010   ULNAR NERVE REPAIR Left 2014   Past Surgical History:  Procedure Laterality Date   ANTERIOR CERVICAL DECOMP/DISCECTOMY FUSION N/A 02/06/2014   Procedure: CERVICAL FIVE TO CERVICAL SIX, CERVICAL SIX TO CERVICAL SEVEN  ANTERIOR CERVICAL DECOMPRESSION/DISCECTOMY FUSION 2 LEVELS;  Surgeon: Floyce Stakes, MD;  Location: [facility];  Service: Neurosurgery;  Laterality: N/A;  C5-6 C6-7 Anterior cervical decompression/diskectomy/fusion   COLONOSCOPY W/ BIOPSIES AND POLYPECTOMY     LUMBAR LAMINECTOMY/DECOMPRESSION MICRODISCECTOMY Left 09/15/2016   Procedure: LEFT LUMBAR FOUR-FIVE, LUMBAR FIVE-SACRAL ONE MICRODISCECTOMY;  Surgeon: Leeroy Cha, MD;  Location: [place];  Service: Neurosurgery;  Laterality: Left;  LEFT L4-5 L5-S1 MICRODISCECTOMY   PROSTATE SURGERY  11/27/2010   ULNAR NERVE REPAIR Left 2014   Past Medical History:  Diagnosis Date   Cancer [facility]) 2012   Prostate cancer   Elevated cholesterol    GERD (  gastroesophageal reflux disease)    H/O hiatal hernia    Headache(784.0)    due to accident in the past   Neuromuscular disorder (HCC)    numbness and tingling   BP (!) 145/62   Pulse 70   Temp (!) 97.1 F (36.2 C)   Ht 5' 10.5" (1.791 m)   Wt 220 lb (99.8 kg)   BMI 31.12 kg/m   Opioid Risk Score:   Fall Risk Score:  `1  Depression screen PHQ 2/9  Depression screen [facility] 2/9 02/27/2021 02/06/2021 01/21/2021 08/29/2020 08/08/2019 02/02/2019  04/15/2018  Decreased Interest 0 0 0 0 0 0 0  Down, Depressed, Hopeless 0 0 0 0 0 0 0  PHQ - 2 Score 0 0 0 0 0 0 0  Altered sleeping - - - - - - -  Tired, decreased energy - - - - - - -  Change in appetite - - - - - - -  Feeling bad or failure about yourself  - - - - - - -  Trouble concentrating - - - - - - -  Moving slowly or fidgety/restless - - - - - - -  Suicidal thoughts - - - - - - -  PHQ-9 Score - - - - - - -  Difficult doing work/chores - - - - - - -    Review of Systems  Musculoskeletal:  Positive for back pain and gait problem.       Leg pain, feet pain  All other systems reviewed and are negative.     Objective:   Physical Exam Vitals and nursing note reviewed.  Musculoskeletal:     Comments: No Physical Exam Performed: My-Chart Video Visit         Assessment & Plan:  1. Failed Back Syndrome/ Facet Arthropathy Continue HEP as Tolerated. Continue to Monitor. 05/29/2021 2. Right Lumbar Radiculitis: S/P  Right ESI with Good Relief Noted. Continue Gabapentin. Continue to Monitor. 05/29/2021 3. Chronic Pain Syndrome: Continue current medication regimen. Continue to Monitor.05/29/2021 4. Muscle Spasm: No complaints. Continue to Monitor.05/29/2021  My-Chart Video Visit Establish Patient Location of Patient: In his Home Location of Provider: In the Office    F/U in 6 months.

## 2021-06-01 DIAGNOSIS — Z20822 Contact with and (suspected) exposure to covid-19: Secondary | ICD-10-CM | POA: Diagnosis not present

## 2021-06-02 ENCOUNTER — Other Ambulatory Visit: Payer: Self-pay | Admitting: Internal Medicine

## 2021-06-02 DIAGNOSIS — I10 Essential (primary) hypertension: Secondary | ICD-10-CM | POA: Diagnosis not present

## 2021-06-02 DIAGNOSIS — E785 Hyperlipidemia, unspecified: Secondary | ICD-10-CM | POA: Diagnosis not present

## 2021-06-02 DIAGNOSIS — R7303 Prediabetes: Secondary | ICD-10-CM | POA: Diagnosis not present

## 2021-06-23 DIAGNOSIS — K219 Gastro-esophageal reflux disease without esophagitis: Secondary | ICD-10-CM | POA: Diagnosis not present

## 2021-06-23 DIAGNOSIS — K921 Melena: Secondary | ICD-10-CM | POA: Diagnosis not present

## 2021-06-23 DIAGNOSIS — Z86010 Personal history of colonic polyps: Secondary | ICD-10-CM | POA: Diagnosis not present

## 2021-06-23 DIAGNOSIS — K624 Stenosis of anus and rectum: Secondary | ICD-10-CM | POA: Diagnosis not present

## 2021-06-23 DIAGNOSIS — K649 Unspecified hemorrhoids: Secondary | ICD-10-CM | POA: Diagnosis not present

## 2021-06-23 DIAGNOSIS — Z7902 Long term (current) use of antithrombotics/antiplatelets: Secondary | ICD-10-CM | POA: Diagnosis not present

## 2021-07-03 DIAGNOSIS — I10 Essential (primary) hypertension: Secondary | ICD-10-CM | POA: Diagnosis not present

## 2021-07-03 DIAGNOSIS — R7303 Prediabetes: Secondary | ICD-10-CM | POA: Diagnosis not present

## 2021-07-03 DIAGNOSIS — E785 Hyperlipidemia, unspecified: Secondary | ICD-10-CM | POA: Diagnosis not present

## 2021-07-25 ENCOUNTER — Other Ambulatory Visit: Payer: Self-pay | Admitting: Physical Medicine & Rehabilitation

## 2021-07-25 DIAGNOSIS — K648 Other hemorrhoids: Secondary | ICD-10-CM | POA: Diagnosis not present

## 2021-07-25 DIAGNOSIS — I10 Essential (primary) hypertension: Secondary | ICD-10-CM | POA: Diagnosis not present

## 2021-07-25 DIAGNOSIS — K624 Stenosis of anus and rectum: Secondary | ICD-10-CM | POA: Diagnosis not present

## 2021-07-25 DIAGNOSIS — K621 Rectal polyp: Secondary | ICD-10-CM | POA: Diagnosis not present

## 2021-07-25 DIAGNOSIS — D128 Benign neoplasm of rectum: Secondary | ICD-10-CM | POA: Diagnosis not present

## 2021-07-25 DIAGNOSIS — Z1211 Encounter for screening for malignant neoplasm of colon: Secondary | ICD-10-CM | POA: Diagnosis not present

## 2021-07-25 DIAGNOSIS — D122 Benign neoplasm of ascending colon: Secondary | ICD-10-CM | POA: Diagnosis not present

## 2021-07-25 DIAGNOSIS — D126 Benign neoplasm of colon, unspecified: Secondary | ICD-10-CM | POA: Diagnosis not present

## 2021-07-25 DIAGNOSIS — Z8673 Personal history of transient ischemic attack (TIA), and cerebral infarction without residual deficits: Secondary | ICD-10-CM | POA: Diagnosis not present

## 2021-07-25 DIAGNOSIS — Z86010 Personal history of colonic polyps: Secondary | ICD-10-CM | POA: Diagnosis not present

## 2021-07-25 DIAGNOSIS — D127 Benign neoplasm of rectosigmoid junction: Secondary | ICD-10-CM | POA: Diagnosis not present

## 2021-07-26 DIAGNOSIS — M25462 Effusion, left knee: Secondary | ICD-10-CM | POA: Diagnosis not present

## 2021-07-26 DIAGNOSIS — Z7982 Long term (current) use of aspirin: Secondary | ICD-10-CM | POA: Diagnosis not present

## 2021-07-26 DIAGNOSIS — Z7902 Long term (current) use of antithrombotics/antiplatelets: Secondary | ICD-10-CM | POA: Diagnosis not present

## 2021-07-26 DIAGNOSIS — Z79899 Other long term (current) drug therapy: Secondary | ICD-10-CM | POA: Diagnosis not present

## 2021-07-26 DIAGNOSIS — E7800 Pure hypercholesterolemia, unspecified: Secondary | ICD-10-CM | POA: Diagnosis not present

## 2021-07-29 ENCOUNTER — Telehealth: Payer: Self-pay | Admitting: Physical Medicine & Rehabilitation

## 2021-07-29 NOTE — Telephone Encounter (Signed)
RIght L5-S1 Lumbar transforaminal epidural steroid injection was the last injection on 02/06/21

## 2021-07-29 NOTE — Telephone Encounter (Signed)
Patient can hardly walk because he is in so much pain.  Wife is calling to get another back injection for the patient.  Please advise.

## 2021-07-30 NOTE — Telephone Encounter (Signed)
Placed a call to Mr. Kampf , Mrs Blahnik states Mr. Erber has Methocarbamol and doesn't need a refill.

## 2021-07-30 NOTE — Telephone Encounter (Signed)
Called patient scheduled a repeat injection but on left side

## 2021-08-02 DIAGNOSIS — E785 Hyperlipidemia, unspecified: Secondary | ICD-10-CM | POA: Diagnosis not present

## 2021-08-02 DIAGNOSIS — R7303 Prediabetes: Secondary | ICD-10-CM | POA: Diagnosis not present

## 2021-08-02 DIAGNOSIS — I10 Essential (primary) hypertension: Secondary | ICD-10-CM | POA: Diagnosis not present

## 2021-08-06 ENCOUNTER — Other Ambulatory Visit: Payer: Self-pay

## 2021-08-06 ENCOUNTER — Ambulatory Visit (INDEPENDENT_AMBULATORY_CARE_PROVIDER_SITE_OTHER): Payer: Medicare Other | Admitting: Orthopedic Surgery

## 2021-08-06 DIAGNOSIS — M25462 Effusion, left knee: Secondary | ICD-10-CM | POA: Diagnosis not present

## 2021-08-06 DIAGNOSIS — M545 Low back pain, unspecified: Secondary | ICD-10-CM

## 2021-08-06 DIAGNOSIS — M5459 Other low back pain: Secondary | ICD-10-CM | POA: Diagnosis not present

## 2021-08-10 ENCOUNTER — Encounter: Payer: Self-pay | Admitting: Orthopedic Surgery

## 2021-08-10 MED ORDER — BUPIVACAINE HCL 0.25 % IJ SOLN
4.0000 mL | INTRAMUSCULAR | Status: AC | PRN
  Administered 2021-08-06: 4 mL via INTRA_ARTICULAR

## 2021-08-10 MED ORDER — METHYLPREDNISOLONE ACETATE 40 MG/ML IJ SUSP
40.0000 mg | INTRAMUSCULAR | Status: AC | PRN
  Administered 2021-08-06: 40 mg via INTRA_ARTICULAR

## 2021-08-10 MED ORDER — LIDOCAINE HCL 1 % IJ SOLN
5.0000 mL | INTRAMUSCULAR | Status: AC | PRN
  Administered 2021-08-06: 5 mL

## 2021-08-10 NOTE — Progress Notes (Signed)
Office Visit Note   Patient: Caleb Woodward           Date of Birth: Nov 01, 1946           MRN: [id number] Visit Date: 08/06/2021 Requested by: Maryella Shivers, MD [address] [place],  [place] [id number] PCP: Maryella Shivers, MD  Subjective: Chief Complaint  Patient presents with   Left Knee - Pain    HPI: Caleb Woodward is a patient with left knee pain.  Reports weakness and giving way.  Had 40 cc aspirated from his knee last week.  Hyperextension hurts him.  Reports primarily anterior pain.  The fluid aspirated was clear fluid culture negative.  No crystals.  Injection not performed.  He has had mild improvement over the past 2 weeks.  Decreased activity now in general.  Takes Tylenol and topical Voltaren cream which helps with his symptoms.  Also uses ice nightly.  Patient also has back issues.  He is in a pain clinic and has a history of neck and back spinal injections.  Has fairly persistent Numbness.  Had surgery with Dr. Joya Salm in the past.  Takes Robaxin but no opioids.  Would like to have someone take over his episodic injections.              ROS: All systems reviewed are negative as they relate to the chief complaint within the history of present illness.  Patient denies  fevers or chills.   Assessment & Plan: Visit Diagnoses: No diagnosis found.  Plan: Impression is left knee pain.  Aspiration injection performed today.  Got about 20 cc out.  I would also like to send him to Dr. Ernestina Patches for consult as well as possible lumbar spine ESI.  MRI scan from March 2022 shows right subarticular recess narrowing crowding the right L3 nerve root along with moderate to advanced spinal stenosis at L3-4 as well as new herniation at L4-5 causing high-grade spinal stenosis with inferiorly and rightward migrating extrusion component going along the right L5 nerve root.  This is likely the cause of his issues.  May need intervention in the near future but for now I think it would be good to  start with Dr. Ernestina Patches per patient request for another physician to do injections which have helped him in the past.  Follow-Up Instructions: No follow-ups on file.   Orders:  No orders of the defined types were placed in this encounter.  No orders of the defined types were placed in this encounter.     Procedures: Large Joint Inj: L knee on 08/06/2021 6:30 PM Indications: diagnostic evaluation, joint swelling and pain Details: 18 G 1.5 in needle, superolateral approach  Arthrogram: No  Medications: 5 mL lidocaine 1 %; 40 mg methylPREDNISolone acetate 40 MG/ML; 4 mL bupivacaine 0.25 % Outcome: tolerated well, no immediate complications Procedure, treatment alternatives, risks and benefits explained, specific risks discussed. Consent was given by the patient. Immediately prior to procedure a time out was called to verify the correct patient, procedure, equipment, support staff and site/side marked as required. Patient was prepped and draped in the usual sterile fashion.      Clinical Data: No additional findings.  Objective: Vital Signs: There were no vitals taken for this visit.  Physical Exam:   Constitutional: Patient appears well-developed HEENT:  Head: Normocephalic Eyes:EOM are normal Neck: Normal range of motion Cardiovascular: Normal rate Pulmonary/chest: Effort normal Neurologic: Patient is alert Skin: Skin is warm Psychiatric: Patient has normal mood and  affect   Ortho Exam: Ortho exam demonstrates full active and passive range of motion of both knees.  He is got mild effusion in the left knee.  Extensor mechanism is intact.  Collateral and cruciate ligaments are stable.  Does have some paresthesias on the right-hand side in the L4-5 distribution.  Has very good ankle dorsiflexion plantarflexion quad hamstring strength with no nerve root tension signs on the left but mild nerve root tension signs on the right.  No muscle atrophy in the leg.  Reflexes symmetric 0 1+  out of 4 bilateral patella and Achilles.  Specialty Comments:  No specialty comments available.  Imaging: No results found.   PMFS History: Patient Active Problem List   Diagnosis Date Noted   Abnormality of gait 01/21/2021   Tendonitis 08/29/2020   Plantar fasciitis 08/29/2020   Neuropathic pain 08/29/2020   Myalgia 02/29/2020   Spondylosis without myelopathy or radiculopathy, lumbar region 12/26/2019   Facet arthropathy 11/28/2019   Failed back syndrome 09/05/2019   Slow heart rate 08/30/2018   Chronic pain syndrome 07/27/2018   Obstructive sleep apnea 09/01/2017   Insomnia 09/01/2017   RBD (REM behavioral disorder) 09/01/2017   Lumbar stenosis 09/15/2016   Cervical stenosis of spinal canal 02/06/2014   Past Medical History:  Diagnosis Date   Cancer ([place]) 2012   Prostate cancer   Elevated cholesterol    GERD (gastroesophageal reflux disease)    H/O hiatal hernia    Headache(784.0)    due to accident in the past   Neuromuscular disorder ([place])    numbness and tingling    Family History  Problem Relation Age of Onset   Diabetes Mother     Past Surgical History:  Procedure Laterality Date   ANTERIOR CERVICAL DECOMP/DISCECTOMY FUSION N/A 02/06/2014   Procedure: CERVICAL FIVE TO CERVICAL SIX, CERVICAL SIX TO CERVICAL SEVEN  ANTERIOR CERVICAL DECOMPRESSION/DISCECTOMY FUSION 2 LEVELS;  Surgeon: Floyce Stakes, MD;  Location: [facility];  Service: Neurosurgery;  Laterality: N/A;  C5-6 C6-7 Anterior cervical decompression/diskectomy/fusion   COLONOSCOPY W/ BIOPSIES AND POLYPECTOMY     LUMBAR LAMINECTOMY/DECOMPRESSION MICRODISCECTOMY Left 09/15/2016   Procedure: LEFT LUMBAR FOUR-FIVE, LUMBAR FIVE-SACRAL ONE MICRODISCECTOMY;  Surgeon: Leeroy Cha, MD;  Location: [place];  Service: Neurosurgery;  Laterality: Left;  LEFT L4-5 L5-S1 MICRODISCECTOMY   PROSTATE SURGERY  11/27/2010   ULNAR NERVE REPAIR Left 2014   Social History   Occupational History   Not on file   Tobacco Use   Smoking status: Never   Smokeless tobacco: Never  Vaping Use   Vaping Use: Never used  Substance and Sexual Activity   Alcohol use: Yes    Comment: socially but rarely   Drug use: No   Sexual activity: Not on file

## 2021-08-11 NOTE — Addendum Note (Signed)
Addended byLaurann Montana on: 08/11/2021 04:26 PM   Modules accepted: Orders

## 2021-08-14 ENCOUNTER — Other Ambulatory Visit: Payer: Self-pay

## 2021-08-14 ENCOUNTER — Encounter: Payer: Self-pay | Admitting: Physical Medicine and Rehabilitation

## 2021-08-14 ENCOUNTER — Ambulatory Visit (INDEPENDENT_AMBULATORY_CARE_PROVIDER_SITE_OTHER): Payer: Medicare Other | Admitting: Physical Medicine and Rehabilitation

## 2021-08-14 VITALS — BP 135/78 | HR 91

## 2021-08-14 DIAGNOSIS — G8929 Other chronic pain: Secondary | ICD-10-CM | POA: Diagnosis not present

## 2021-08-14 DIAGNOSIS — M961 Postlaminectomy syndrome, not elsewhere classified: Secondary | ICD-10-CM | POA: Diagnosis not present

## 2021-08-14 DIAGNOSIS — M5442 Lumbago with sciatica, left side: Secondary | ICD-10-CM

## 2021-08-14 DIAGNOSIS — M5416 Radiculopathy, lumbar region: Secondary | ICD-10-CM

## 2021-08-14 DIAGNOSIS — M48062 Spinal stenosis, lumbar region with neurogenic claudication: Secondary | ICD-10-CM

## 2021-08-14 DIAGNOSIS — M25562 Pain in left knee: Secondary | ICD-10-CM

## 2021-08-14 NOTE — Progress Notes (Signed)
Pt state lower back pain that travels down both legs. Pt state walking, standing and laying down makes the pain worse. Pt state he takes over the counter pain meds and uses pain cream to help ease his back.  Numeric Pain Rating Scale and Functional Assessment Average Pain 9 Pain Right Now 4 My pain is constant, dull, and aching Pain is worse with: walking, bending, sitting, standing, and some activites Pain improves with: heat/ice and medication   In the last MONTH (on 0-10 scale) has pain interfered with the following?  1. General activity like being  able to carry out your everyday physical activities such as walking, climbing stairs, carrying groceries, or moving a chair?  Rating(7)  2. Relation with others like being able to carry out your usual social activities and roles such as  activities at home, at work and in your community. Rating(8)  3. Enjoyment of life such that you have  been bothered by emotional problems such as feeling anxious, depressed or irritable?  Rating(9)

## 2021-08-14 NOTE — Progress Notes (Signed)
Caleb Woodward - 75 y.o. male MRN [id number]  Date of birth: 29-Mar-1946  Office Visit Note: Visit Date: 08/14/2021 PCP: Maryella Shivers, MD Referred by: Maryella Shivers, MD  Subjective: Chief Complaint  Patient presents with   Lower Back - Pain   HPI: Caleb Woodward is a 75 y.o. male who comes in today per the request of Dr. Alphonzo Severance for evaluation of bilateral lower back pain radiating to left hip and down leg. Patient currently being treated by Dr. Alysia Penna at [facility] and was previously treated by Dr. Delice Lesch before he left practice. Patient states he has an appointment coming up for lumbar epidural steroid injection with Dr. Alysia Penna on 09/30/2021, however he reports increased pain and does not want to wait that long to receive injection. Patient reports history of many lumbar epidural injections that has given him significant and sustained pain relief for 6-9 months. Patient had right L5-S1 transforaminal epidural steroid injection in April by Dr. Alysia Penna which he reports gave him good and sustained pain relief of right sided radicular symptoms. Patient reports pain is exacerbated by walking, bending and prolonged standing, describes as soreness and shooting sensation, rates as 8 out of 10 at present. Patient reports some relief of pain with rest and medications. Patient's lumbar MRI from March exhibits disc herniation at L4-L5 causing high grade spinal stenosis, migrating extrusion causing severe L5 nerve root compression and moderate to advanced spinal canal stenosis at L3-L4. Patient had left L4-L5 and L5-S1 decompression in 2017 performed by Dr. Leeroy Cha. Patient did attend formal physical therapy at [facility] earlier in the year which he reports did help to alleviate lower back pain. Patient is currently being treated by Dr. Marcene Duos for chronic left knee issues. Patient denies focal  weakness, numbness and tingling. Patient denies recent trauma or falls.   Review of Systems  Musculoskeletal:  Positive for back pain and joint pain.  Neurological:  Negative for tingling, sensory change, focal weakness and weakness.  All other systems reviewed and are negative. Otherwise per HPI.  Assessment & Plan: Visit Diagnoses:    ICD-10-CM   1. Lumbar radiculopathy  M54.16 Ambulatory referral to Physical Medicine Rehab    2. Spinal stenosis of lumbar region with neurogenic claudication  M48.062     3. Post laminectomy syndrome  M96.1     4. Chronic pain of left knee  M25.562    G89.29     5. Chronic left-sided low back pain with left-sided sciatica  M54.42    G89.29        Plan: Findings:  Chronic, worsening and severe bilateral lower back pain radiating to left hip and down leg. Patient continues to have excruciating pain despite good conservative therapies such as formal physical therapy and medications.  Patient's clinical presentation and exam are consistent with classic L5 pattern.  Patient does have high-grade spinal stenosis at the level of L4-L5 and a migrating extrusion causing severe L5 nerve root compression.  Patient has had great success with previous transforaminal epidural steroid injections and we feel the neck step is to perform a diagnostic and hopefully therapeutic left L5 transforaminal epidural steroid injection under fluoroscopic guidance.  If patient does well with injection we will continue to monitor however if his pain persists we will consider regrouping with physical therapy and possibly adjusting medications.  No red flag symptoms noted upon exam today.   Meds & Orders: No orders  of the defined types were placed in this encounter.   Orders Placed This Encounter  Procedures   Ambulatory referral to Physical Medicine Rehab    Follow-up: Return in about 1 week (around 08/21/2021) for Left L5 transforaminal epidural steroid injection.    Procedures: No procedures performed      Clinical History: MRI LUMBAR SPINE WITHOUT AND WITH CONTRAST   TECHNIQUE: Multiplanar and multiecho pulse sequences of the lumbar spine were obtained without and with intravenous contrast.   CONTRAST:  31mL GADAVIST GADOBUTROL 1 MMOL/ML IV SOLN   COMPARISON:  02/01/2018   FINDINGS: Segmentation:  Standard lumbar numbering   Alignment:  Physiologic   Vertebrae:  No fracture, evidence of discitis, or bone lesion.   Conus medullaris and cauda equina: Conus extends to the T12-L1 level. Conus and cauda equina appear normal.   Paraspinal and other soft tissues: Postoperative scarring to a mild degree in the low left lumbar spine.   Disc levels:   T12- L1: Ventral disc bulging.   L1-L2: Mild disc narrowing and bulging   L2-L3: Disc narrowing and bulging with posterior annular fissure and tiny right paracentral protrusion. Encroachment on the right L3 nerve root at the subarticular recess which is progressed.   L3-L4: Disc narrowing and bulging. Facet spurring and ligamentum flavum thickening. High-grade spinal stenosis with cauda equina crowding, unchanged. Patent foramina   L4-L5: Disc narrowing and bulging with a newright eccentric herniation which compresses the thecal sac to an advanced degree, especially on the right where there is asymmetric facet spurring and ligamentum flavum thickening post left laminotomy and partial facetectomy. There is an inferiorly migrating extrusion component to the mid L5 body level which impinges severely on the right L5 nerve root.   L5-S1:Disc collapse and endplate degeneration with endplate ridging. Degenerative facet spurring.   IMPRESSION: 1. Symptomatic finding is likely at L4-5 where there is a new herniation causing high-grade spinal stenosis. An inferiorly and rightward migrating extrusion component tracks along the right L5 nerve root which is severely compressed. 2. L3-4  chronic moderate to advanced spinal stenosis due to degenerative disease and short pedicles. 3. L2-3 right subarticular recess narrowing crowding the right L3 nerve root.     Electronically Signed   By: Monte Fantasia M.D.   On: 01/27/2021 08:09   He reports that he has never smoked. He has never used smokeless tobacco. No results for input(s): HGBA1C, LABURIC in the last 8760 hours.  Objective:  VS:  HT:    WT:   BMI:     BP:135/78  HR:91bpm  TEMP: ( )  RESP:  Physical Exam Vitals and nursing note reviewed.  HENT:     Head: Normocephalic and atraumatic.     Right Ear: External ear normal.     Left Ear: External ear normal.     Nose: Nose normal.     Mouth/Throat:     Mouth: Mucous membranes are moist.  Eyes:     Pupils: Pupils are equal, round, and reactive to light.  Cardiovascular:     Rate and Rhythm: Normal rate.     Pulses: Normal pulses.  Pulmonary:     Effort: Pulmonary effort is normal.  Abdominal:     General: Abdomen is flat. There is no distension.  Musculoskeletal:        General: Tenderness present.     Cervical back: Normal range of motion.     Comments: Pt rises from seated position to standing without difficulty. Good lumbar range  of motion. Strong distal strength without clonus, no pain upon palpation of greater trochanters. Sensation intact bilaterally. Dysesthesias noted to left L5 dermatome. Walks independently, gait steady.     Skin:    General: Skin is warm and dry.     Capillary Refill: Capillary refill takes less than 2 seconds.  Neurological:     General: No focal deficit present.     Mental Status: He is alert and oriented to person, place, and time.  Psychiatric:        Mood and Affect: Mood normal.    Ortho Exam  Imaging: No results found.  Past Medical/Family/Surgical/Social History: Medications & Allergies reviewed per EMR, new medications updated. Patient Active Problem List   Diagnosis Date Noted   Abnormality of gait  01/21/2021   Tendonitis 08/29/2020   Plantar fasciitis 08/29/2020   Neuropathic pain 08/29/2020   Myalgia 02/29/2020   Spondylosis without myelopathy or radiculopathy, lumbar region 12/26/2019   Facet arthropathy 11/28/2019   Failed back syndrome 09/05/2019   Slow heart rate 08/30/2018   Chronic pain syndrome 07/27/2018   Obstructive sleep apnea 09/01/2017   Insomnia 09/01/2017   RBD (REM behavioral disorder) 09/01/2017   Lumbar stenosis 09/15/2016   Cervical stenosis of spinal canal 02/06/2014   Past Medical History:  Diagnosis Date   Cancer ([place]) 2012   Prostate cancer   Elevated cholesterol    GERD (gastroesophageal reflux disease)    H/O hiatal hernia    Headache(784.0)    due to accident in the past   Neuromuscular disorder ([place])    numbness and tingling   Family History  Problem Relation Age of Onset   Diabetes Mother    Past Surgical History:  Procedure Laterality Date   ANTERIOR CERVICAL DECOMP/DISCECTOMY FUSION N/A 02/06/2014   Procedure: CERVICAL FIVE TO CERVICAL SIX, CERVICAL SIX TO CERVICAL SEVEN  ANTERIOR CERVICAL DECOMPRESSION/DISCECTOMY FUSION 2 LEVELS;  Surgeon: Floyce Stakes, MD;  Location: [facility];  Service: Neurosurgery;  Laterality: N/A;  C5-6 C6-7 Anterior cervical decompression/diskectomy/fusion   COLONOSCOPY W/ BIOPSIES AND POLYPECTOMY     LUMBAR LAMINECTOMY/DECOMPRESSION MICRODISCECTOMY Left 09/15/2016   Procedure: LEFT LUMBAR FOUR-FIVE, LUMBAR FIVE-SACRAL ONE MICRODISCECTOMY;  Surgeon: Leeroy Cha, MD;  Location: [facility];  Service: Neurosurgery;  Laterality: Left;  LEFT L4-5 L5-S1 MICRODISCECTOMY   PROSTATE SURGERY  11/27/2010   ULNAR NERVE REPAIR Left 2014   Social History   Occupational History   Not on file  Tobacco Use   Smoking status: Never   Smokeless tobacco: Never  Vaping Use   Vaping Use: Never used  Substance and Sexual Activity   Alcohol use: Yes    Comment: socially but rarely   Drug use: No   Sexual activity: Not on  file

## 2021-08-21 ENCOUNTER — Ambulatory Visit (INDEPENDENT_AMBULATORY_CARE_PROVIDER_SITE_OTHER): Payer: Medicare Other | Admitting: Physical Medicine and Rehabilitation

## 2021-08-21 ENCOUNTER — Other Ambulatory Visit: Payer: Self-pay

## 2021-08-21 ENCOUNTER — Ambulatory Visit: Payer: Self-pay

## 2021-08-21 ENCOUNTER — Encounter: Payer: Self-pay | Admitting: Physical Medicine and Rehabilitation

## 2021-08-21 VITALS — BP 164/88 | HR 61

## 2021-08-21 DIAGNOSIS — M5416 Radiculopathy, lumbar region: Secondary | ICD-10-CM | POA: Diagnosis not present

## 2021-08-21 MED ORDER — METHYLPREDNISOLONE ACETATE 80 MG/ML IJ SUSP
80.0000 mg | Freq: Once | INTRAMUSCULAR | Status: AC
  Administered 2021-08-21: 80 mg

## 2021-08-21 NOTE — Patient Instructions (Signed)

## 2021-08-21 NOTE — Progress Notes (Signed)
Pt state lower back pain that travels down both legs. Pt state walking, standing and laying down makes the pain worse. Pt state he takes over the counter pain meds and uses pain cream to help ease his back.  Numeric Pain Rating Scale and Functional Assessment Average Pain 5   In the last MONTH (on 0-10 scale) has pain interfered with the following?  1. General activity like being  able to carry out your everyday physical activities such as walking, climbing stairs, carrying groceries, or moving a chair?  Rating(8)   +Driver, +BT, -Dye Allergies.

## 2021-09-02 DIAGNOSIS — R7303 Prediabetes: Secondary | ICD-10-CM | POA: Diagnosis not present

## 2021-09-02 DIAGNOSIS — E785 Hyperlipidemia, unspecified: Secondary | ICD-10-CM | POA: Diagnosis not present

## 2021-09-02 DIAGNOSIS — I10 Essential (primary) hypertension: Secondary | ICD-10-CM | POA: Diagnosis not present

## 2021-09-02 NOTE — Progress Notes (Signed)
Caleb Woodward - 75 y.o. male MRN [id number]  Date of birth: May 16, 1946  Office Visit Note: Visit Date: 08/21/2021 PCP: Maryella Shivers, MD Referred by: Maryella Shivers, MD  Subjective: Chief Complaint  Patient presents with   Lower Back - Pain   Left Leg - Pain   Right Leg - Pain   HPI:  Caleb Woodward is a 75 y.o. male who comes in today at the request of Barnet Pall, FNP for planned Left L5-S1 Lumbar Transforaminal epidural steroid injection with fluoroscopic guidance.  The patient has failed conservative care including home exercise, medications, time and activity modification.  This injection will be diagnostic and hopefully therapeutic.  Please see requesting physician notes for further details and justification.   ROS Otherwise per HPI.  Assessment & Plan: Visit Diagnoses:    ICD-10-CM   1. Lumbar radiculopathy  M54.16 XR C-ARM NO REPORT    Epidural Steroid injection    methylPREDNISolone acetate (DEPO-MEDROL) injection 80 mg      Plan: No additional findings.   Meds & Orders:  Meds ordered this encounter  Medications   methylPREDNISolone acetate (DEPO-MEDROL) injection 80 mg    Orders Placed This Encounter  Procedures   XR C-ARM NO REPORT   Epidural Steroid injection    Follow-up: Return if symptoms worsen or fail to improve.   Procedures: No procedures performed  Lumbosacral Transforaminal Epidural Steroid Injection - Sub-Pedicular Approach with Fluoroscopic Guidance  Patient: Caleb Woodward      Date of Birth: 1946/02/20 MRN: [id number] PCP: Maryella Shivers, MD      Visit Date: 08/21/2021   Universal Protocol:    Date/Time: 08/21/2021  Consent Given By: the patient  Position: PRONE  Additional Comments: Vital signs were monitored before and after the procedure. Patient was prepped and draped in the usual sterile fashion. The correct patient, procedure, and site was verified.   Injection Procedure Details:   Procedure diagnoses: Lumbar  radiculopathy [M54.16]    Meds Administered:  Meds ordered this encounter  Medications   methylPREDNISolone acetate (DEPO-MEDROL) injection 80 mg    Laterality: Left  Location/Site: L5  Needle:5.0 in., 22 ga.  Short bevel or Quincke spinal needle  Needle Placement: Transforaminal  Findings:    -Comments: Excellent flow of contrast along the nerve, nerve root and into the epidural space.  Procedure Details: After squaring off the end-plates to get a true AP view, the C-arm was positioned so that an oblique view of the foramen as noted above was visualized. The target area is just inferior to the "nose of the scotty dog" or sub pedicular. The soft tissues overlying this structure were infiltrated with 2-3 ml. of 1% Lidocaine without Epinephrine.  The spinal needle was inserted toward the target using a "trajectory" view along the fluoroscope beam.  Under AP and lateral visualization, the needle was advanced so it did not puncture dura and was located close the 6 O'Clock position of the pedical in AP tracterory. Biplanar projections were used to confirm position. Aspiration was confirmed to be negative for CSF and/or blood. A 1-2 ml. volume of Isovue-250 was injected and flow of contrast was noted at each level. Radiographs were obtained for documentation purposes.   After attaining the desired flow of contrast documented above, a 0.5 to 1.0 ml test dose of 0.25% Marcaine was injected into each respective transforaminal space.  The patient was observed for 90 seconds post injection.  After no sensory deficits were reported, and normal lower extremity motor function was  noted,   the above injectate was administered so that equal amounts of the injectate were placed at each foramen (level) into the transforaminal epidural space.   Additional Comments:  No complications occurred Dressing: 2 x 2 sterile gauze and Band-Aid    Post-procedure details: Patient was observed during the  procedure. Post-procedure instructions were reviewed.  Patient left the clinic in stable condition.     Clinical History: MRI LUMBAR SPINE WITHOUT AND WITH CONTRAST   TECHNIQUE: Multiplanar and multiecho pulse sequences of the lumbar spine were obtained without and with intravenous contrast.   CONTRAST:  37mL GADAVIST GADOBUTROL 1 MMOL/ML IV SOLN   COMPARISON:  02/01/2018   FINDINGS: Segmentation:  Standard lumbar numbering   Alignment:  Physiologic   Vertebrae:  No fracture, evidence of discitis, or bone lesion.   Conus medullaris and cauda equina: Conus extends to the T12-L1 level. Conus and cauda equina appear normal.   Paraspinal and other soft tissues: Postoperative scarring to a mild degree in the low left lumbar spine.   Disc levels:   T12- L1: Ventral disc bulging.   L1-L2: Mild disc narrowing and bulging   L2-L3: Disc narrowing and bulging with posterior annular fissure and tiny right paracentral protrusion. Encroachment on the right L3 nerve root at the subarticular recess which is progressed.   L3-L4: Disc narrowing and bulging. Facet spurring and ligamentum flavum thickening. High-grade spinal stenosis with cauda equina crowding, unchanged. Patent foramina   L4-L5: Disc narrowing and bulging with a newright eccentric herniation which compresses the thecal sac to an advanced degree, especially on the right where there is asymmetric facet spurring and ligamentum flavum thickening post left laminotomy and partial facetectomy. There is an inferiorly migrating extrusion component to the mid L5 body level which impinges severely on the right L5 nerve root.   L5-S1:Disc collapse and endplate degeneration with endplate ridging. Degenerative facet spurring.   IMPRESSION: 1. Symptomatic finding is likely at L4-5 where there is a new herniation causing high-grade spinal stenosis. An inferiorly and rightward migrating extrusion component tracks along the right  L5 nerve root which is severely compressed. 2. L3-4 chronic moderate to advanced spinal stenosis due to degenerative disease and short pedicles. 3. L2-3 right subarticular recess narrowing crowding the right L3 nerve root.     Electronically Signed   By: Monte Fantasia M.D.   On: 01/27/2021 08:09     Objective:  VS:  HT:    WT:   BMI:     BP:(!) 164/88  HR:61bpm  TEMP: ( )  RESP:  Physical Exam Vitals and nursing note reviewed.  Constitutional:      General: He is not in acute distress.    Appearance: Normal appearance. He is not ill-appearing.  HENT:     Head: Normocephalic and atraumatic.     Right Ear: External ear normal.     Left Ear: External ear normal.     Nose: No congestion.  Eyes:     Extraocular Movements: Extraocular movements intact.  Cardiovascular:     Rate and Rhythm: Normal rate.     Pulses: Normal pulses.  Pulmonary:     Effort: Pulmonary effort is normal. No respiratory distress.  Abdominal:     General: There is no distension.     Palpations: Abdomen is soft.  Musculoskeletal:        General: No tenderness or signs of injury.     Cervical back: Neck supple.     Right lower leg: No  edema.     Left lower leg: No edema.     Comments: Patient has good distal strength without clonus.  Skin:    Findings: No erythema or rash.  Neurological:     General: No focal deficit present.     Mental Status: He is alert and oriented to person, place, and time.     Sensory: No sensory deficit.     Motor: No weakness or abnormal muscle tone.     Coordination: Coordination normal.  Psychiatric:        Mood and Affect: Mood normal.        Behavior: Behavior normal.     Imaging: No results found.

## 2021-09-02 NOTE — Procedures (Signed)
Lumbosacral Transforaminal Epidural Steroid Injection - Sub-Pedicular Approach with Fluoroscopic Guidance  Patient: Caleb Woodward      Date of Birth: 08-06-1946 MRN: [id number] PCP: Maryella Shivers, MD      Visit Date: 08/21/2021   Universal Protocol:    Date/Time: 08/21/2021  Consent Given By: the patient  Position: PRONE  Additional Comments: Vital signs were monitored before and after the procedure. Patient was prepped and draped in the usual sterile fashion. The correct patient, procedure, and site was verified.   Injection Procedure Details:   Procedure diagnoses: Lumbar radiculopathy [M54.16]    Meds Administered:  Meds ordered this encounter  Medications   methylPREDNISolone acetate (DEPO-MEDROL) injection 80 mg    Laterality: Left  Location/Site: L5  Needle:5.0 in., 22 ga.  Short bevel or Quincke spinal needle  Needle Placement: Transforaminal  Findings:    -Comments: Excellent flow of contrast along the nerve, nerve root and into the epidural space.  Procedure Details: After squaring off the end-plates to get a true AP view, the C-arm was positioned so that an oblique view of the foramen as noted above was visualized. The target area is just inferior to the "nose of the scotty dog" or sub pedicular. The soft tissues overlying this structure were infiltrated with 2-3 ml. of 1% Lidocaine without Epinephrine.  The spinal needle was inserted toward the target using a "trajectory" view along the fluoroscope beam.  Under AP and lateral visualization, the needle was advanced so it did not puncture dura and was located close the 6 O'Clock position of the pedical in AP tracterory. Biplanar projections were used to confirm position. Aspiration was confirmed to be negative for CSF and/or blood. A 1-2 ml. volume of Isovue-250 was injected and flow of contrast was noted at each level. Radiographs were obtained for documentation purposes.   After attaining the desired flow  of contrast documented above, a 0.5 to 1.0 ml test dose of 0.25% Marcaine was injected into each respective transforaminal space.  The patient was observed for 90 seconds post injection.  After no sensory deficits were reported, and normal lower extremity motor function was noted,   the above injectate was administered so that equal amounts of the injectate were placed at each foramen (level) into the transforaminal epidural space.   Additional Comments:  No complications occurred Dressing: 2 x 2 sterile gauze and Band-Aid    Post-procedure details: Patient was observed during the procedure. Post-procedure instructions were reviewed.  Patient left the clinic in stable condition.

## 2021-09-05 DIAGNOSIS — R7303 Prediabetes: Secondary | ICD-10-CM | POA: Diagnosis not present

## 2021-09-05 DIAGNOSIS — E785 Hyperlipidemia, unspecified: Secondary | ICD-10-CM | POA: Diagnosis not present

## 2021-09-12 DIAGNOSIS — R7303 Prediabetes: Secondary | ICD-10-CM | POA: Diagnosis not present

## 2021-09-12 DIAGNOSIS — I10 Essential (primary) hypertension: Secondary | ICD-10-CM | POA: Diagnosis not present

## 2021-09-12 DIAGNOSIS — E785 Hyperlipidemia, unspecified: Secondary | ICD-10-CM | POA: Diagnosis not present

## 2021-09-12 DIAGNOSIS — Z20822 Contact with and (suspected) exposure to covid-19: Secondary | ICD-10-CM | POA: Diagnosis not present

## 2021-09-12 DIAGNOSIS — J020 Streptococcal pharyngitis: Secondary | ICD-10-CM | POA: Diagnosis not present

## 2021-09-12 DIAGNOSIS — R6889 Other general symptoms and signs: Secondary | ICD-10-CM | POA: Diagnosis not present

## 2021-09-15 ENCOUNTER — Ambulatory Visit: Payer: Medicare Other | Admitting: Orthopedic Surgery

## 2021-09-29 DIAGNOSIS — Z23 Encounter for immunization: Secondary | ICD-10-CM | POA: Diagnosis not present

## 2021-09-29 DIAGNOSIS — Z6830 Body mass index (BMI) 30.0-30.9, adult: Secondary | ICD-10-CM | POA: Diagnosis not present

## 2021-09-29 DIAGNOSIS — Z139 Encounter for screening, unspecified: Secondary | ICD-10-CM | POA: Diagnosis not present

## 2021-09-29 DIAGNOSIS — E785 Hyperlipidemia, unspecified: Secondary | ICD-10-CM | POA: Diagnosis not present

## 2021-09-30 ENCOUNTER — Ambulatory Visit: Payer: Medicare Other | Admitting: Physical Medicine & Rehabilitation

## 2021-10-01 ENCOUNTER — Ambulatory Visit (INDEPENDENT_AMBULATORY_CARE_PROVIDER_SITE_OTHER): Payer: Medicare Other | Admitting: Orthopedic Surgery

## 2021-10-01 ENCOUNTER — Other Ambulatory Visit: Payer: Self-pay

## 2021-10-01 ENCOUNTER — Encounter: Payer: Self-pay | Admitting: Orthopedic Surgery

## 2021-10-01 DIAGNOSIS — M25462 Effusion, left knee: Secondary | ICD-10-CM | POA: Diagnosis not present

## 2021-10-01 DIAGNOSIS — M25562 Pain in left knee: Secondary | ICD-10-CM

## 2021-10-01 MED ORDER — LIDOCAINE HCL 1 % IJ SOLN
5.0000 mL | INTRAMUSCULAR | Status: AC | PRN
Start: 2021-10-01 — End: 2021-10-01
  Administered 2021-10-01: 5 mL

## 2021-10-01 MED ORDER — BUPIVACAINE HCL 0.25 % IJ SOLN
4.0000 mL | INTRAMUSCULAR | Status: AC | PRN
Start: 2021-10-01 — End: 2021-10-01
  Administered 2021-10-01: 4 mL via INTRA_ARTICULAR

## 2021-10-01 NOTE — Progress Notes (Signed)
Office Visit Note   Patient: Caleb Woodward           Date of Birth: 1946-02-07           MRN: [id number] Visit Date: 10/01/2021 Requested by: Maryella Shivers, MD [address] [place],  [place] [address] PCP: Maryella Shivers, MD  Subjective: Chief Complaint  Patient presents with   Left Knee - Pain    HPI: Caleb Woodward is a 75 year old patient with left knee pain.  He also has some back issues.  Recently had an injection with Dr. Ernestina Patches which has helped his back pain.  Does have a component of radicular symptoms as well affecting the right and left leg.  More the right leg than the left.  He had an MRI scan in February which shows some degeneration of that meniscal root but no definite severe arthritis or ligament problem.  He has had recurrent effusion and pain which will wake him from sleep at night and give him weakness.  Hard to say if that is all coming from the knee or if this is potentially back related as well.  Had an injection in the past which helped him for about 2 weeks.  He denies any groin pain.  He does use a sleep aid.  Denies any mechanical symptoms just reports pain.              ROS: All systems reviewed are negative as they relate to the chief complaint within the history of present illness.  Patient denies  fevers or chills.   Assessment & Plan: Visit Diagnoses:  1. Left knee pain, unspecified chronicity     Plan: Impression is left knee pain.  His scan is about 32 months old.  We are considering operative intervention but there is nothing really on that scan from February which would account for pain which wakes him from sleep at night.  Also nothing that would account for weakness.  I do think some of this could be referred pain from his back.  Nonetheless the effusion is a hard objective finding which has recurred.  MRI indicated to guide decision making about arthroscopic intervention.  Follow-up after that study.  Today's knee is aspirated and injected  with Toradol.  We did aspirate about 40 cc of fluid.  Follow-Up Instructions: Return for after MRI.   Orders:  Orders Placed This Encounter  Procedures   MR Knee Left w/o contrast   No orders of the defined types were placed in this encounter.     Procedures: Large Joint Inj: L knee on 10/01/2021 12:28 PM Indications: diagnostic evaluation, joint swelling and pain Details: 18 G 1.5 in needle, superolateral approach  Arthrogram: No  Medications: 5 mL lidocaine 1 %; 4 mL bupivacaine 0.25 % Outcome: tolerated well, no immediate complications Procedure, treatment alternatives, risks and benefits explained, specific risks discussed. Consent was given by the patient. Immediately prior to procedure a time out was called to verify the correct patient, procedure, equipment, support staff and site/side marked as required. Patient was prepped and draped in the usual sterile fashion.      Clinical Data: No additional findings.  Objective: Vital Signs: There were no vitals taken for this visit.  Physical Exam:   Constitutional: Patient appears well-developed HEENT:  Head: Normocephalic Eyes:EOM are normal Neck: Normal range of motion Cardiovascular: Normal rate Pulmonary/chest: Effort normal Neurologic: Patient is alert Skin: Skin is warm Psychiatric: Patient has normal mood and affect   Ortho Exam:  Ortho exam demonstrates full active and passive range of motion of the knee.  Mild effusion is present.  Collateral crucial ligaments are stable.  Medial joint line tenderness is present.  No groin pain with internal ex rotation of the leg.  Extensor mechanism is intact.  Specialty Comments:  No specialty comments available.  Imaging: No results found.   PMFS History: Patient Active Problem List   Diagnosis Date Noted   Abnormality of gait 01/21/2021   Tendonitis 08/29/2020   Plantar fasciitis 08/29/2020   Neuropathic pain 08/29/2020   Myalgia 02/29/2020   Spondylosis  without myelopathy or radiculopathy, lumbar region 12/26/2019   Facet arthropathy 11/28/2019   Failed back syndrome 09/05/2019   Slow heart rate 08/30/2018   Chronic pain syndrome 07/27/2018   Obstructive sleep apnea 09/01/2017   Insomnia 09/01/2017   RBD (REM behavioral disorder) 09/01/2017   Lumbar stenosis 09/15/2016   Cervical stenosis of spinal canal 02/06/2014   Past Medical History:  Diagnosis Date   Cancer ([place]) 2012   Prostate cancer   Elevated cholesterol    GERD (gastroesophageal reflux disease)    H/O hiatal hernia    Headache(784.0)    due to accident in the past   Neuromuscular disorder ([place])    numbness and tingling    Family History  Problem Relation Age of Onset   Diabetes Mother     Past Surgical History:  Procedure Laterality Date   ANTERIOR CERVICAL DECOMP/DISCECTOMY FUSION N/A 02/06/2014   Procedure: CERVICAL FIVE TO CERVICAL SIX, CERVICAL SIX TO CERVICAL SEVEN  ANTERIOR CERVICAL DECOMPRESSION/DISCECTOMY FUSION 2 LEVELS;  Surgeon: Floyce Stakes, MD;  Location: [facility];  Service: Neurosurgery;  Laterality: N/A;  C5-6 C6-7 Anterior cervical decompression/diskectomy/fusion   COLONOSCOPY W/ BIOPSIES AND POLYPECTOMY     LUMBAR LAMINECTOMY/DECOMPRESSION MICRODISCECTOMY Left 09/15/2016   Procedure: LEFT LUMBAR FOUR-FIVE, LUMBAR FIVE-SACRAL ONE MICRODISCECTOMY;  Surgeon: Leeroy Cha, MD;  Location: [facility];  Service: Neurosurgery;  Laterality: Left;  LEFT L4-5 L5-S1 MICRODISCECTOMY   PROSTATE SURGERY  11/27/2010   ULNAR NERVE REPAIR Left 2014   Social History   Occupational History   Not on file  Tobacco Use   Smoking status: Never   Smokeless tobacco: Never  Vaping Use   Vaping Use: Never used  Substance and Sexual Activity   Alcohol use: Yes    Comment: socially but rarely   Drug use: No   Sexual activity: Not on file

## 2021-10-02 DIAGNOSIS — I10 Essential (primary) hypertension: Secondary | ICD-10-CM | POA: Diagnosis not present

## 2021-10-02 DIAGNOSIS — E785 Hyperlipidemia, unspecified: Secondary | ICD-10-CM | POA: Diagnosis not present

## 2021-10-19 ENCOUNTER — Ambulatory Visit
Admission: RE | Admit: 2021-10-19 | Discharge: 2021-10-19 | Disposition: A | Discharge status: Home / Self Care | Payer: Medicare Other | Source: Ambulatory Visit | Attending: Orthopedic Surgery | Admitting: Orthopedic Surgery

## 2021-10-19 DIAGNOSIS — M25562 Pain in left knee: Secondary | ICD-10-CM

## 2021-10-19 DIAGNOSIS — M25462 Effusion, left knee: Secondary | ICD-10-CM | POA: Diagnosis not present

## 2021-10-19 DIAGNOSIS — M1712 Unilateral primary osteoarthritis, left knee: Secondary | ICD-10-CM | POA: Diagnosis not present

## 2021-10-19 DIAGNOSIS — S83242A Other tear of medial meniscus, current injury, left knee, initial encounter: Secondary | ICD-10-CM | POA: Diagnosis not present

## 2021-10-20 NOTE — Progress Notes (Signed)
Hi [name] can you tell me why and her Goldie is seeing Estill Batten on the fourth.  Thanks

## 2021-10-23 ENCOUNTER — Ambulatory Visit (INDEPENDENT_AMBULATORY_CARE_PROVIDER_SITE_OTHER): Payer: Medicare Other | Admitting: Orthopedic Surgery

## 2021-10-23 ENCOUNTER — Other Ambulatory Visit: Payer: Self-pay

## 2021-10-23 DIAGNOSIS — M25462 Effusion, left knee: Secondary | ICD-10-CM | POA: Diagnosis not present

## 2021-10-23 MED ORDER — TRAMADOL HCL 50 MG PO TABS: ORAL_TABLET | ORAL | 0 refills | Status: DC

## 2021-10-27 ENCOUNTER — Encounter: Payer: Self-pay | Admitting: Orthopedic Surgery

## 2021-10-27 MED ORDER — BUPIVACAINE HCL 0.25 % IJ SOLN
10.0000 mL | INTRAMUSCULAR | Status: AC | PRN
Start: 2021-10-23 — End: 2021-10-23
  Administered 2021-10-23: 07:00:00 10 mL via INTRA_ARTICULAR

## 2021-10-27 MED ORDER — LIDOCAINE HCL 1 % IJ SOLN
5.0000 mL | INTRAMUSCULAR | Status: AC | PRN
  Administered 2021-10-23: 07:00:00 5 mL

## 2021-10-27 NOTE — Progress Notes (Signed)
Office Visit Note   Patient: Caleb Woodward           Date of Birth: 24-Jun-1946           MRN: [id number] Visit Date: 10/23/2021 Requested by: Maryella Shivers, MD [address] [place],  [place] [address] PCP: Maryella Shivers, MD  Subjective: Chief Complaint  Patient presents with   Other     Scan review    HPI: Caleb Woodward is a 75 year old patient here to review MRI scan of his left knee.  Last injection helped him for about 3 to 4 days.  Takes Tylenol for symptoms.  His wife notices that he does limp.  Hurts to weight-bear after he has been sitting for a while.  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  MRI scan is reviewed with the patient.  The scan is compared to the scan from 10 months ago.  Scan does show meniscal root tear with extrusion of the medial meniscus and early edema in the medial tibial plateau which was not present 10 months ago.              ROS: All systems reviewed are negative as they relate to the chief complaint within the history of present illness.  Patient denies  fevers or chills.   Assessment & Plan: Visit Diagnoses:  1. Effusion, left knee     Plan: Impression is left knee effusion with progressive arthritis developing in the knee due to meniscal root tear.  Patient is having increasing symptoms.  Ultram prescribed.  Knee aspirated today as there was a moderate 40 cc effusion present.  How long talk today with hander about operative versus nonoperative treatment options.  Patient does have significant arthritis in the patellofemoral joint along with the medial joint.  I think his only option for predictable pain relief would be total knee replacement.  That is a significant operation in terms of rehabilitation.  The risk and benefits are discussed including not limited to infection nerve vessel damage knee stiffness as well as incomplete pain relief.  Patient is not quite there clinically and symptomatically yet for knee replacement but he may arrive at  that point sometime within the next year or 2.  For now we will aspirate the knee and hold off on an injection in case he wants to get that done within the next 3 months.  Continue with nonweightbearing quad strengthening exercises.  Ultram prescribed.  Follow-Up Instructions: Return if symptoms worsen or fail to improve.   Orders:  No orders of the defined types were placed in this encounter.  Meds ordered this encounter  Medications   traMADol (ULTRAM) 50 MG tablet    Sig: 1 po bid prn pain    Dispense:  40 tablet    Refill:  0      Procedures: Large Joint Inj: L knee on 10/23/2021 7:22 AM Indications: diagnostic evaluation, joint swelling and pain Details: 18 G 1.5 in needle, superolateral approach  Arthrogram: No  Medications: 5 mL lidocaine 1 %; 10 mL bupivacaine 0.25 % Outcome: tolerated well, no immediate complications Procedure, treatment alternatives, risks and benefits explained, specific risks discussed. Consent was given by the patient. Immediately prior to procedure a time out was called to verify the correct patient, procedure, equipment, support staff and site/side marked as required. Patient was prepped and draped in the usual sterile fashion.      Clinical Data: No additional findings.  Objective: Vital Signs: There were no vitals  taken for this visit.  Physical Exam:   Constitutional: Patient appears well-developed HEENT:  Head: Normocephalic Eyes:EOM are normal Neck: Normal range of motion Cardiovascular: Normal rate Pulmonary/chest: Effort normal Neurologic: Patient is alert Skin: Skin is warm Psychiatric: Patient has normal mood and affect   Ortho Exam: Ortho exam demonstrates slightly antalgic gait to the left.  Moderate effusion present in the left knee.  Has relatively global knee pain with ambulation.  Pedal pulses palpable.  No calf tenderness.  Has good range of motion from about 2 to 3 degrees to 125 of flexion.  Collateral and cruciate  ligaments are stable.  Specialty Comments:  No specialty comments available.  Imaging: No results found.   PMFS History: Patient Active Problem List   Diagnosis Date Noted   Abnormality of gait 01/21/2021   Tendonitis 08/29/2020   Plantar fasciitis 08/29/2020   Neuropathic pain 08/29/2020   Myalgia 02/29/2020   Spondylosis without myelopathy or radiculopathy, lumbar region 12/26/2019   Facet arthropathy 11/28/2019   Failed back syndrome 09/05/2019   Slow heart rate 08/30/2018   Chronic pain syndrome 07/27/2018   Obstructive sleep apnea 09/01/2017   Insomnia 09/01/2017   RBD (REM behavioral disorder) 09/01/2017   Lumbar stenosis 09/15/2016   Cervical stenosis of spinal canal 02/06/2014   Past Medical History:  Diagnosis Date   Cancer ([place]) 2012   Prostate cancer   Elevated cholesterol    GERD (gastroesophageal reflux disease)    H/O hiatal hernia    Headache(784.0)    due to accident in the past   Neuromuscular disorder ([place])    numbness and tingling    Family History  Problem Relation Age of Onset   Diabetes Mother     Past Surgical History:  Procedure Laterality Date   ANTERIOR CERVICAL DECOMP/DISCECTOMY FUSION N/A 02/06/2014   Procedure: CERVICAL FIVE TO CERVICAL SIX, CERVICAL SIX TO CERVICAL SEVEN  ANTERIOR CERVICAL DECOMPRESSION/DISCECTOMY FUSION 2 LEVELS;  Surgeon: Floyce Stakes, MD;  Location: [facility];  Service: Neurosurgery;  Laterality: N/A;  C5-6 C6-7 Anterior cervical decompression/diskectomy/fusion   COLONOSCOPY W/ BIOPSIES AND POLYPECTOMY     LUMBAR LAMINECTOMY/DECOMPRESSION MICRODISCECTOMY Left 09/15/2016   Procedure: LEFT LUMBAR FOUR-FIVE, LUMBAR FIVE-SACRAL ONE MICRODISCECTOMY;  Surgeon: Leeroy Cha, MD;  Location: [facility];  Service: Neurosurgery;  Laterality: Left;  LEFT L4-5 L5-S1 MICRODISCECTOMY   PROSTATE SURGERY  11/27/2010   ULNAR NERVE REPAIR Left 2014   Social History   Occupational History   Not on file  Tobacco Use   Smoking  status: Never   Smokeless tobacco: Never  Vaping Use   Vaping Use: Never used  Substance and Sexual Activity   Alcohol use: Yes    Comment: socially but rarely   Drug use: No   Sexual activity: Not on file

## 2021-11-02 ENCOUNTER — Encounter: Payer: Self-pay | Admitting: Orthopedic Surgery

## 2021-11-02 DIAGNOSIS — I10 Essential (primary) hypertension: Secondary | ICD-10-CM | POA: Diagnosis not present

## 2021-11-02 DIAGNOSIS — E785 Hyperlipidemia, unspecified: Secondary | ICD-10-CM | POA: Diagnosis not present

## 2021-11-04 ENCOUNTER — Other Ambulatory Visit: Payer: Self-pay | Admitting: Orthopedic Surgery

## 2021-11-04 ENCOUNTER — Encounter: Payer: Self-pay | Admitting: Orthopedic Surgery

## 2021-11-04 MED ORDER — ACETAMINOPHEN-CODEINE #3 300-30 MG PO TABS: 1.0000 | ORAL_TABLET | Freq: Four times a day (QID) | ORAL | 0 refills | Status: DC | PRN

## 2021-11-04 NOTE — Telephone Encounter (Signed)
Ok for t 3 1 po q 6 # 30 pls clala thx

## 2021-11-04 NOTE — Telephone Encounter (Signed)
Probably should avoid NSAIDs since he's on Plavix. Already taking gabapentin and Tylenol.  Could try Tylenol with codeine once or twice per day just temporarily leading up to surgery but could give him a similar reaction as the Tramadol.  Also could consider topical medications like Voltaren or Biofreeze

## 2021-11-04 NOTE — Telephone Encounter (Signed)
I sent in Tylenol 3 1 p.o. every 6 #30.

## 2021-11-05 ENCOUNTER — Ambulatory Visit: Payer: Medicare Other | Admitting: Physician Assistant

## 2021-11-06 ENCOUNTER — Encounter: Payer: Self-pay | Admitting: Orthopedic Surgery

## 2021-11-11 NOTE — Telephone Encounter (Signed)
I have not seen Medicare  cover hospital bed for that diagnosis code of total knee replacement.

## 2021-11-14 NOTE — Pre-Procedure Instructions (Signed)
Surgical Instructions    Your procedure is scheduled on Thursday, November 20, 2021 at 12:30 PM.  Report to [facility] Main Entrance "A" at 10:30 A.M., then check in with the Admitting office.  Call this number if you have problems the morning of surgery:  [phone]   If you have any questions prior to your surgery date call [phone]: Open Monday-Friday 8am-4pm    Remember:  Do not eat after midnight the night before your surgery  You may drink clear liquids until 9:30 AM the morning of your surgery.   Clear liquids allowed are: Water, Non-Citrus Juices (without pulp), Carbonated Beverages, Clear Tea, Black Coffee Only, and Gatorade   Enhanced Recovery after Surgery for Orthopedics Enhanced Recovery after Surgery is a protocol used to improve the stress on your body and your recovery after surgery.  Patient Instructions  The day of surgery (if you do NOT have diabetes):  Drink ONE (1) Pre-Surgery Clear Ensure by 9:30 am the morning of surgery   This drink was given to you during your hospital  pre-op appointment visit. Nothing else to drink after completing the  Pre-Surgery Clear Ensure.         If you have questions, please contact your surgeons office.     Take these medicines the morning of surgery with A SIP OF WATER:  citalopram (CELEXA) ezetimibe (ZETIA) rosuvastatin (CRESTOR) acetaminophen-codeine (TYLENOL #3) - if needed   As of today, STOP taking any Aspirin (unless otherwise instructed by your surgeon) Aleve, Naproxen, Ibuprofen, Motrin, Advil, Goody's, BC's, diclofenac Sodium (VOLTAREN) 1 % GEL, all herbal medications, fish oil, and all vitamins.                      Do NOT Smoke (Tobacco/Vaping) or drink Alcohol 24 hours prior to your procedure.  If you use a CPAP at night, you may bring all equipment for your overnight stay.   Contacts, glasses, piercing's, hearing aid's, dentures or partials may not be worn into surgery, please bring cases for  these belongings.    For patients admitted to the hospital, discharge time will be determined by your treatment team.   Patients discharged the day of surgery will not be allowed to drive home, and someone needs to stay with them for 24 hours.  NO VISITORS WILL BE ALLOWED IN PRE-OP WHERE PATIENTS GET READY FOR SURGERY.  ONLY 1 SUPPORT PERSON MAY BE PRESENT IN THE WAITING ROOM WHILE YOU ARE IN SURGERY.  IF YOU ARE TO BE ADMITTED, ONCE YOU ARE IN YOUR ROOM YOU WILL BE ALLOWED TWO (2) VISITORS.  Minor children may have two parents present. Special consideration for safety and communication needs will be reviewed on a case by case basis.   Special instructions:   [facility]- Preparing For Surgery  Before surgery, you can play an important role. Because skin is not sterile, your skin needs to be as free of germs as possible. You can reduce the number of germs on your skin by washing with CHG (chlorahexidine gluconate) Soap before surgery.  CHG is an antiseptic cleaner which kills germs and bonds with the skin to continue killing germs even after washing.    Oral Hygiene is also important to reduce your risk of infection.  Remember - BRUSH YOUR TEETH THE MORNING OF SURGERY WITH YOUR REGULAR TOOTHPASTE  Please do not use if you have an allergy to CHG or antibacterial soaps. If your skin becomes reddened/irritated stop using the CHG.  Do not shave (including legs and underarms) for at least 48 hours prior to first CHG shower. It is OK to shave your face.  Please follow these instructions carefully.   Shower the NIGHT BEFORE SURGERY and the MORNING OF SURGERY  If you chose to wash your hair, wash your hair first as usual with your normal shampoo.  After you shampoo, rinse your hair and body thoroughly to remove the shampoo.  Use CHG Soap as you would any other liquid soap. You can apply CHG directly to the skin and wash gently with a scrungie or a clean washcloth.   Apply the CHG Soap to your  body ONLY FROM THE NECK DOWN.  Do not use on open wounds or open sores. Avoid contact with your eyes, ears, mouth and genitals (private parts). Wash Face and genitals (private parts)  with your normal soap.   Wash thoroughly, paying special attention to the area where your surgery will be performed.  Thoroughly rinse your body with warm water from the neck down.  DO NOT shower/wash with your normal soap after using and rinsing off the CHG Soap.  Pat yourself dry with a CLEAN TOWEL.  Wear CLEAN PAJAMAS to bed the night before surgery  Place CLEAN SHEETS on your bed the night before your surgery  DO NOT SLEEP WITH PETS.   Day of Surgery: Shower with CHG soap. Do not wear jewelry. Do not wear lotions, powders, colognes, or deodorant. Do not shave 48 hours prior to surgery.  Men may shave face and neck. Do not bring valuables to the hospital. [facility] is not responsible for any belongings or valuables. Wear Clean/Comfortable clothing the morning of surgery Remember to brush your teeth WITH YOUR REGULAR TOOTHPASTE.   Please read over the following fact sheets that you were given.   3 days prior to your procedure or After your COVID test   You are not required to quarantine however you are required to wear a well-fitting mask when you are out and around people not in your household. If your mask becomes wet or soiled, replace with a new one.   Wash your hands often with soap and water for 20 seconds or clean your hands with an alcohol-based hand sanitizer that contains at least 60% alcohol.   Do not share personal items.   Notify your provider:  o if you are in close contact with someone who has COVID  o or if you develop a fever of 100.4 or greater, sneezing, cough, sore throat, shortness of breath or body aches.

## 2021-11-17 ENCOUNTER — Encounter (HOSPITAL_COMMUNITY)
Admission: RE | Admit: 2021-11-17 | Discharge: 2021-11-17 | Disposition: A | Discharge status: Home / Self Care | Payer: Medicare Other | Source: Ambulatory Visit | Attending: Orthopedic Surgery | Admitting: Orthopedic Surgery

## 2021-11-17 ENCOUNTER — Other Ambulatory Visit: Payer: Self-pay

## 2021-11-17 ENCOUNTER — Encounter (HOSPITAL_COMMUNITY): Payer: Self-pay

## 2021-11-17 VITALS — BP 151/81 | HR 65 | Temp 97.7°F | Resp 18 | Ht 70.0 in | Wt 212.3 lb

## 2021-11-17 DIAGNOSIS — Z20822 Contact with and (suspected) exposure to covid-19: Secondary | ICD-10-CM | POA: Insufficient documentation

## 2021-11-17 DIAGNOSIS — Z87898 Personal history of other specified conditions: Secondary | ICD-10-CM | POA: Insufficient documentation

## 2021-11-17 DIAGNOSIS — Z01818 Encounter for other preprocedural examination: Secondary | ICD-10-CM | POA: Insufficient documentation

## 2021-11-17 DIAGNOSIS — N3946 Mixed incontinence: Secondary | ICD-10-CM | POA: Diagnosis not present

## 2021-11-17 HISTORY — DX: Sleep apnea, unspecified: G47.30

## 2021-11-17 LAB — CBC
HCT: 40.6 % (ref 39.0–52.0)
Hemoglobin: 13.3 g/dL (ref 13.0–17.0)
MCH: 27.5 pg (ref 26.0–34.0)
MCHC: 32.8 g/dL (ref 30.0–36.0)
MCV: 83.9 fL (ref 80.0–100.0)
Platelets: 314 10*3/uL (ref 150–400)
RBC: 4.84 MIL/uL (ref 4.22–5.81)
RDW: 13.2 % (ref 11.5–15.5)
WBC: 6.4 10*3/uL (ref 4.0–10.5)
nRBC: 0 % (ref 0.0–0.2)

## 2021-11-17 LAB — BASIC METABOLIC PANEL
Anion gap: 10 (ref 5–15)
BUN: 12 mg/dL (ref 8–23)
CO2: 25 mmol/L (ref 22–32)
Calcium: 9.4 mg/dL (ref 8.9–10.3)
Chloride: 103 mmol/L (ref 98–111)
Creatinine, Ser: 1.04 mg/dL (ref 0.61–1.24)
GFR, Estimated: >60 mL/min (ref >60)
Glucose, Bld: 83 mg/dL (ref 70–99)
Potassium: 4.5 mmol/L (ref 3.5–5.1)
Sodium: 138 mmol/L (ref 135–145)

## 2021-11-17 LAB — URINALYSIS, ROUTINE W REFLEX MICROSCOPIC
Bilirubin Urine: NEGATIVE
Glucose, UA: NEGATIVE mg/dL
Hgb urine dipstick: NEGATIVE
Ketones, ur: NEGATIVE mg/dL
Leukocytes,Ua: NEGATIVE
Nitrite: NEGATIVE
Protein, ur: NEGATIVE mg/dL
Specific Gravity, Urine: 1.015 (ref 1.005–1.030)
pH: 5.5 (ref 5.0–8.0)

## 2021-11-17 LAB — SURGICAL PCR SCREEN
MRSA, PCR: NEGATIVE
Staphylococcus aureus: NEGATIVE

## 2021-11-17 LAB — SARS CORONAVIRUS 2 (TAT 6-24 HRS): SARS Coronavirus 2: NEGATIVE

## 2021-11-17 LAB — GLUCOSE, CAPILLARY: Glucose-Capillary: 112 mg/dL — ABNORMAL HIGH (ref 70–99)

## 2021-11-17 NOTE — Progress Notes (Signed)
PCP: Maryella Shivers, MD Cardiologist: Quay Burow, MD  EKG: 11/17/21 CXR: na ECHO: 09/08/18 Stress Test: >10 years Cardiac Cath: denies  Fasting Blood Sugar- na Checks Blood Sugar_ na__ times a day  OSA/CPAP: Yes, does not wear cpap  ASA/Blood Thinner: No  Covid test 11/17/21 at PAT  Anesthesia Review: No  Patient denies shortness of breath, fever, cough, and chest pain at PAT appointment.  Patient verbalized understanding of instructions provided today at the PAT appointment.  Patient asked to review instructions at home and day of surgery.

## 2021-11-18 ENCOUNTER — Ambulatory Visit: Payer: Medicare Other | Admitting: Registered Nurse

## 2021-11-18 LAB — URINE CULTURE: Culture: NO GROWTH

## 2021-11-19 ENCOUNTER — Telehealth: Payer: Self-pay | Admitting: *Deleted

## 2021-11-19 MED ORDER — TRANEXAMIC ACID 1000 MG/10ML IV SOLN
2000.0000 mg | INTRAVENOUS | Status: DC
  Filled 2021-11-19: qty 20

## 2021-11-19 NOTE — Telephone Encounter (Signed)
Ortho bundle Pre-op call completed. 

## 2021-11-19 NOTE — Care Plan (Signed)
Office RNCM call to patient prior to his upcoming Left total knee arthroplasty with Dr. Marlou Sa on 11/20/21. He is an Ortho bundle patient through [facility] and is agreeable to case management. He lives with his spouse, who will be assisting after discharge. He will need stair training to be able to go to an upstairs bedroom/bathroom. 12 steps to get upstairs in his home. He has a RW as well as elevated toilets. CPM has been delivered by [employer] for use after surgery. Anticipate HHPT will be needed after short hospital stay. Referral made to [facility] after choice provided. Reviewed post op care instructions with his wife. Will continue to follow for needs.

## 2021-11-20 ENCOUNTER — Encounter (HOSPITAL_COMMUNITY)
Admission: RE | Disposition: A | Discharge status: Home / Self Care | Payer: Self-pay | Source: Home / Self Care | Attending: Orthopedic Surgery

## 2021-11-20 ENCOUNTER — Other Ambulatory Visit: Payer: Self-pay

## 2021-11-20 ENCOUNTER — Ambulatory Visit (HOSPITAL_COMMUNITY): Payer: Medicare Other | Admitting: Anesthesiology

## 2021-11-20 ENCOUNTER — Encounter (HOSPITAL_COMMUNITY): Payer: Self-pay | Admitting: Orthopedic Surgery

## 2021-11-20 ENCOUNTER — Ambulatory Visit: Payer: Medicare Other | Admitting: Internal Medicine

## 2021-11-20 ENCOUNTER — Inpatient Hospital Stay (HOSPITAL_COMMUNITY)
Admission: RE | Admit: 2021-11-20 | Discharge: 2021-11-23 | DRG: 470 | Disposition: A | Discharge status: Home / Self Care | Payer: Medicare Other | Attending: Orthopedic Surgery | Admitting: Orthopedic Surgery

## 2021-11-20 DIAGNOSIS — R030 Elevated blood-pressure reading, without diagnosis of hypertension: Secondary | ICD-10-CM | POA: Diagnosis not present

## 2021-11-20 DIAGNOSIS — I10 Essential (primary) hypertension: Secondary | ICD-10-CM | POA: Diagnosis not present

## 2021-11-20 DIAGNOSIS — G4733 Obstructive sleep apnea (adult) (pediatric): Secondary | ICD-10-CM | POA: Diagnosis not present

## 2021-11-20 DIAGNOSIS — Z79899 Other long term (current) drug therapy: Secondary | ICD-10-CM | POA: Diagnosis not present

## 2021-11-20 DIAGNOSIS — Z981 Arthrodesis status: Secondary | ICD-10-CM

## 2021-11-20 DIAGNOSIS — Z888 Allergy status to other drugs, medicaments and biological substances status: Secondary | ICD-10-CM

## 2021-11-20 DIAGNOSIS — K219 Gastro-esophageal reflux disease without esophagitis: Secondary | ICD-10-CM | POA: Diagnosis not present

## 2021-11-20 DIAGNOSIS — Z01818 Encounter for other preprocedural examination: Secondary | ICD-10-CM

## 2021-11-20 DIAGNOSIS — E7800 Pure hypercholesterolemia, unspecified: Secondary | ICD-10-CM | POA: Diagnosis present

## 2021-11-20 DIAGNOSIS — M25562 Pain in left knee: Secondary | ICD-10-CM | POA: Diagnosis present

## 2021-11-20 DIAGNOSIS — G8918 Other acute postprocedural pain: Secondary | ICD-10-CM | POA: Diagnosis not present

## 2021-11-20 DIAGNOSIS — Z8546 Personal history of malignant neoplasm of prostate: Secondary | ICD-10-CM

## 2021-11-20 DIAGNOSIS — G894 Chronic pain syndrome: Secondary | ICD-10-CM | POA: Diagnosis present

## 2021-11-20 DIAGNOSIS — Z96652 Presence of left artificial knee joint: Secondary | ICD-10-CM

## 2021-11-20 DIAGNOSIS — M1712 Unilateral primary osteoarthritis, left knee: Principal | ICD-10-CM

## 2021-11-20 HISTORY — PX: TOTAL KNEE ARTHROPLASTY: SHX125

## 2021-11-20 SURGERY — ARTHROPLASTY, KNEE, TOTAL
Anesthesia: Spinal | Site: Knee | Laterality: Left

## 2021-11-20 MED ORDER — PROPOFOL 500 MG/50ML IV EMUL
INTRAVENOUS | Status: DC | PRN
  Administered 2021-11-20: 50 ug/kg/min via INTRAVENOUS

## 2021-11-20 MED ORDER — TRANEXAMIC ACID-NACL 1000-0.7 MG/100ML-% IV SOLN
INTRAVENOUS | Status: AC
  Filled 2021-11-20: qty 100

## 2021-11-20 MED ORDER — FENTANYL CITRATE (PF) 100 MCG/2ML IJ SOLN
INTRAMUSCULAR | Status: AC
  Administered 2021-11-20: 50 ug via INTRAVENOUS
  Filled 2021-11-20: qty 2

## 2021-11-20 MED ORDER — IRRISEPT - 450ML BOTTLE WITH 0.05% CHG IN STERILE WATER, USP 99.95% OPTIME
TOPICAL | Status: DC | PRN
  Administered 2021-11-20: 450 mL

## 2021-11-20 MED ORDER — ONDANSETRON HCL 4 MG/2ML IJ SOLN
INTRAMUSCULAR | Status: DC | PRN
  Administered 2021-11-20: 4 mg via INTRAVENOUS

## 2021-11-20 MED ORDER — HYDROMORPHONE HCL 1 MG/ML IJ SOLN
0.5000 mg | INTRAMUSCULAR | Status: DC | PRN
  Administered 2021-11-20 – 2021-11-23 (×5): 0.5 mg via INTRAVENOUS
  Filled 2021-11-20 (×6): qty 0.5

## 2021-11-20 MED ORDER — BUPIVACAINE HCL 0.25 % IJ SOLN
INTRAMUSCULAR | Status: DC | PRN
  Administered 2021-11-20: 30 mL

## 2021-11-20 MED ORDER — OXYCODONE HCL 5 MG PO TABS
5.0000 mg | ORAL_TABLET | ORAL | Status: DC | PRN
  Administered 2021-11-20 – 2021-11-23 (×13): 10 mg via ORAL
  Filled 2021-11-20 (×13): qty 2

## 2021-11-20 MED ORDER — EZETIMIBE 10 MG PO TABS
10.0000 mg | ORAL_TABLET | Freq: Every day | ORAL | Status: DC
  Administered 2021-11-20 – 2021-11-23 (×4): 10 mg via ORAL
  Filled 2021-11-20 (×4): qty 1

## 2021-11-20 MED ORDER — PHENOL 1.4 % MT LIQD: 1.0000 | OROMUCOSAL | Status: DC | PRN

## 2021-11-20 MED ORDER — CHLORHEXIDINE GLUCONATE 0.12 % MT SOLN
OROMUCOSAL | Status: AC
  Administered 2021-11-20: 15 mL
  Filled 2021-11-20: qty 15

## 2021-11-20 MED ORDER — ROPIVACAINE HCL 5 MG/ML IJ SOLN
INTRAMUSCULAR | Status: DC | PRN
  Administered 2021-11-20: 30 mL via PERINEURAL

## 2021-11-20 MED ORDER — ACETAMINOPHEN 500 MG PO TABS: 1000.0000 mg | ORAL_TABLET | Freq: Once | ORAL | Status: AC

## 2021-11-20 MED ORDER — VANCOMYCIN HCL 500 MG IV SOLR
INTRAVENOUS | Status: DC | PRN
Start: 2021-11-20 — End: 2021-11-20
  Administered 2021-11-20: 500 mg via TOPICAL

## 2021-11-20 MED ORDER — CLONIDINE HCL (ANALGESIA) 100 MCG/ML EP SOLN
EPIDURAL | Status: AC
  Filled 2021-11-20: qty 10

## 2021-11-20 MED ORDER — CLONAZEPAM 0.5 MG PO TABS
1.5000 mg | ORAL_TABLET | Freq: Every day | ORAL | Status: DC
  Administered 2021-11-21 – 2021-11-23 (×4): 1.5 mg via ORAL
  Filled 2021-11-20 (×5): qty 3

## 2021-11-20 MED ORDER — VANCOMYCIN HCL 500 MG IV SOLR
INTRAVENOUS | Status: AC
  Filled 2021-11-20: qty 10

## 2021-11-20 MED ORDER — TRANEXAMIC ACID 1000 MG/10ML IV SOLN
INTRAVENOUS | Status: DC | PRN
  Administered 2021-11-20: 2000 mg via TOPICAL

## 2021-11-20 MED ORDER — PROPOFOL 10 MG/ML IV BOLUS
INTRAVENOUS | Status: AC
  Filled 2021-11-20: qty 20

## 2021-11-20 MED ORDER — FENTANYL CITRATE (PF) 100 MCG/2ML IJ SOLN: 25.0000 ug | INTRAMUSCULAR | Status: DC | PRN

## 2021-11-20 MED ORDER — ROSUVASTATIN CALCIUM 5 MG PO TABS
5.0000 mg | ORAL_TABLET | Freq: Every day | ORAL | Status: DC
  Administered 2021-11-20 – 2021-11-23 (×4): 5 mg via ORAL
  Filled 2021-11-20 (×4): qty 1

## 2021-11-20 MED ORDER — CEFAZOLIN SODIUM-DEXTROSE 2-4 GM/100ML-% IV SOLN
2.0000 g | Freq: Four times a day (QID) | INTRAVENOUS | Status: AC
  Administered 2021-11-20 – 2021-11-21 (×2): 2 g via INTRAVENOUS
  Filled 2021-11-20 (×2): qty 100

## 2021-11-20 MED ORDER — VANCOMYCIN HCL 1000 MG IV SOLR
INTRAVENOUS | Status: AC
  Filled 2021-11-20: qty 20

## 2021-11-20 MED ORDER — METOCLOPRAMIDE HCL 5 MG/ML IJ SOLN: 5.0000 mg | Freq: Three times a day (TID) | INTRAMUSCULAR | Status: DC | PRN

## 2021-11-20 MED ORDER — FENTANYL CITRATE (PF) 100 MCG/2ML IJ SOLN: 50.0000 ug | Freq: Once | INTRAMUSCULAR | Status: AC

## 2021-11-20 MED ORDER — 0.9 % SODIUM CHLORIDE (POUR BTL) OPTIME
TOPICAL | Status: DC | PRN
  Administered 2021-11-20 (×5): 1000 mL

## 2021-11-20 MED ORDER — MORPHINE SULFATE (PF) 4 MG/ML IV SOLN
INTRAVENOUS | Status: DC | PRN
  Administered 2021-11-20 (×2): 4 mg via INTRAMUSCULAR

## 2021-11-20 MED ORDER — MIDAZOLAM HCL 2 MG/2ML IJ SOLN
INTRAMUSCULAR | Status: AC
  Administered 2021-11-20: 1 mg via INTRAVENOUS
  Filled 2021-11-20: qty 2

## 2021-11-20 MED ORDER — LACTATED RINGERS IV SOLN: INTRAVENOUS | Status: DC

## 2021-11-20 MED ORDER — ASPIRIN 81 MG PO CHEW
81.0000 mg | CHEWABLE_TABLET | Freq: Two times a day (BID) | ORAL | Status: DC
  Administered 2021-11-20 – 2021-11-23 (×6): 81 mg via ORAL
  Filled 2021-11-20 (×6): qty 1

## 2021-11-20 MED ORDER — PROPOFOL 1000 MG/100ML IV EMUL
INTRAVENOUS | Status: AC
  Filled 2021-11-20: qty 100

## 2021-11-20 MED ORDER — BUPIVACAINE HCL (PF) 0.25 % IJ SOLN
INTRAMUSCULAR | Status: AC
  Filled 2021-11-20: qty 30

## 2021-11-20 MED ORDER — DOCUSATE SODIUM 100 MG PO CAPS
100.0000 mg | ORAL_CAPSULE | Freq: Two times a day (BID) | ORAL | Status: DC
  Administered 2021-11-20 – 2021-11-23 (×6): 100 mg via ORAL
  Filled 2021-11-20 (×6): qty 1

## 2021-11-20 MED ORDER — ONDANSETRON HCL 4 MG/2ML IJ SOLN
INTRAMUSCULAR | Status: AC
  Filled 2021-11-20: qty 2

## 2021-11-20 MED ORDER — MENTHOL 3 MG MT LOZG: 1.0000 | LOZENGE | OROMUCOSAL | Status: DC | PRN

## 2021-11-20 MED ORDER — PROPOFOL 10 MG/ML IV BOLUS
INTRAVENOUS | Status: DC | PRN
  Administered 2021-11-20: 30 mg via INTRAVENOUS

## 2021-11-20 MED ORDER — POVIDONE-IODINE 10 % EX SWAB
2.0000 "application " | Freq: Once | CUTANEOUS | Status: AC
  Administered 2021-11-20: 2 via TOPICAL

## 2021-11-20 MED ORDER — BUPIVACAINE IN DEXTROSE 0.75-8.25 % IT SOLN
INTRATHECAL | Status: DC | PRN
  Administered 2021-11-20: 2 mL via INTRATHECAL

## 2021-11-20 MED ORDER — SODIUM CHLORIDE 0.9% FLUSH
INTRAVENOUS | Status: DC | PRN
  Administered 2021-11-20 (×2): 10 mL

## 2021-11-20 MED ORDER — PHENYLEPHRINE 40 MCG/ML (10ML) SYRINGE FOR IV PUSH (FOR BLOOD PRESSURE SUPPORT)
PREFILLED_SYRINGE | INTRAVENOUS | Status: DC | PRN
  Administered 2021-11-20: 80 ug via INTRAVENOUS

## 2021-11-20 MED ORDER — POVIDONE-IODINE 10 % EX SWAB: 2.0000 "application " | Freq: Once | CUTANEOUS | Status: DC

## 2021-11-20 MED ORDER — VANCOMYCIN HCL 1000 MG IV SOLR
INTRAVENOUS | Status: DC | PRN
  Administered 2021-11-20: 1000 mg via TOPICAL

## 2021-11-20 MED ORDER — METHOCARBAMOL 1000 MG/10ML IJ SOLN
500.0000 mg | Freq: Four times a day (QID) | INTRAVENOUS | Status: DC | PRN
  Filled 2021-11-20: qty 5

## 2021-11-20 MED ORDER — ONDANSETRON HCL 4 MG/2ML IJ SOLN: 4.0000 mg | Freq: Once | INTRAMUSCULAR | Status: DC | PRN

## 2021-11-20 MED ORDER — POVIDONE-IODINE 7.5 % EX SOLN
Freq: Once | CUTANEOUS | Status: DC
  Filled 2021-11-20: qty 118

## 2021-11-20 MED ORDER — CITALOPRAM HYDROBROMIDE 10 MG PO TABS
10.0000 mg | ORAL_TABLET | Freq: Every day | ORAL | Status: DC
  Administered 2021-11-20 – 2021-11-23 (×4): 10 mg via ORAL
  Filled 2021-11-20 (×4): qty 1

## 2021-11-20 MED ORDER — ONDANSETRON HCL 4 MG/2ML IJ SOLN: 4.0000 mg | Freq: Four times a day (QID) | INTRAMUSCULAR | Status: DC | PRN

## 2021-11-20 MED ORDER — MIDAZOLAM HCL 2 MG/2ML IJ SOLN: 1.0000 mg | Freq: Once | INTRAMUSCULAR | Status: AC

## 2021-11-20 MED ORDER — MORPHINE SULFATE (PF) 4 MG/ML IV SOLN
INTRAVENOUS | Status: AC
  Filled 2021-11-20: qty 2

## 2021-11-20 MED ORDER — CEFAZOLIN SODIUM-DEXTROSE 2-4 GM/100ML-% IV SOLN
INTRAVENOUS | Status: AC
  Filled 2021-11-20: qty 100

## 2021-11-20 MED ORDER — BUPIVACAINE LIPOSOME 1.3 % IJ SUSP
INTRAMUSCULAR | Status: AC
  Filled 2021-11-20: qty 20

## 2021-11-20 MED ORDER — ACETAMINOPHEN 500 MG PO TABS
ORAL_TABLET | ORAL | Status: AC
  Administered 2021-11-20: 1000 mg via ORAL
  Filled 2021-11-20: qty 2

## 2021-11-20 MED ORDER — BUPIVACAINE LIPOSOME 1.3 % IJ SUSP
INTRAMUSCULAR | Status: DC | PRN
  Administered 2021-11-20: 20 mL

## 2021-11-20 MED ORDER — CLONIDINE HCL (ANALGESIA) 100 MCG/ML EP SOLN
EPIDURAL | Status: DC | PRN
  Administered 2021-11-20: 1 mL

## 2021-11-20 MED ORDER — METHOCARBAMOL 500 MG PO TABS
500.0000 mg | ORAL_TABLET | Freq: Four times a day (QID) | ORAL | Status: DC | PRN
  Administered 2021-11-20 – 2021-11-23 (×7): 500 mg via ORAL
  Filled 2021-11-20 (×8): qty 1

## 2021-11-20 MED ORDER — CHLORHEXIDINE GLUCONATE 0.12 % MT SOLN: 15.0000 mL | Freq: Once | OROMUCOSAL | Status: AC

## 2021-11-20 MED ORDER — PHENYLEPHRINE HCL-NACL 20-0.9 MG/250ML-% IV SOLN
INTRAVENOUS | Status: DC | PRN
  Administered 2021-11-20: 50 ug/min via INTRAVENOUS

## 2021-11-20 MED ORDER — TRANEXAMIC ACID-NACL 1000-0.7 MG/100ML-% IV SOLN
1000.0000 mg | INTRAVENOUS | Status: AC
  Administered 2021-11-20: 1000 mg via INTRAVENOUS

## 2021-11-20 MED ORDER — ACETAMINOPHEN 500 MG PO TABS
1000.0000 mg | ORAL_TABLET | Freq: Four times a day (QID) | ORAL | Status: AC
  Administered 2021-11-20 – 2021-11-21 (×3): 1000 mg via ORAL
  Filled 2021-11-20 (×3): qty 2

## 2021-11-20 MED ORDER — ACETAMINOPHEN 325 MG PO TABS
325.0000 mg | ORAL_TABLET | Freq: Four times a day (QID) | ORAL | Status: DC | PRN
  Administered 2021-11-21 – 2021-11-22 (×2): 650 mg via ORAL
  Filled 2021-11-20 (×2): qty 2

## 2021-11-20 MED ORDER — CEFAZOLIN SODIUM-DEXTROSE 2-4 GM/100ML-% IV SOLN
2.0000 g | INTRAVENOUS | Status: AC
  Administered 2021-11-20: 2 g via INTRAVENOUS

## 2021-11-20 MED ORDER — SODIUM CHLORIDE 0.9 % IR SOLN
Status: DC | PRN
  Administered 2021-11-20: 3000 mL

## 2021-11-20 MED ORDER — ONDANSETRON HCL 4 MG PO TABS: 4.0000 mg | ORAL_TABLET | Freq: Four times a day (QID) | ORAL | Status: DC | PRN

## 2021-11-20 MED ORDER — ORAL CARE MOUTH RINSE: 15.0000 mL | Freq: Once | OROMUCOSAL | Status: AC

## 2021-11-20 MED ORDER — METOCLOPRAMIDE HCL 5 MG PO TABS: 5.0000 mg | ORAL_TABLET | Freq: Three times a day (TID) | ORAL | Status: DC | PRN

## 2021-11-20 SURGICAL SUPPLY — 77 items
BAG COUNTER SPONGE SURGICOUNT (BAG) ×2 IMPLANT
BAG DECANTER FOR FLEXI CONT (MISCELLANEOUS) ×2 IMPLANT
BANDAGE ESMARK 6X9 LF (GAUZE/BANDAGES/DRESSINGS) ×1 IMPLANT
BLADE SAG 18X100X1.27 (BLADE) ×2 IMPLANT
BLADE SAGITTAL (BLADE) ×2
BLADE SAW THK.89X75X18XSGTL (BLADE) ×1 IMPLANT
BNDG COHESIVE 6X5 TAN STRL LF (GAUZE/BANDAGES/DRESSINGS) ×1 IMPLANT
BNDG ELASTIC 6X15 VLCR STRL LF (GAUZE/BANDAGES/DRESSINGS) ×2 IMPLANT
BNDG ESMARK 6X9 LF (GAUZE/BANDAGES/DRESSINGS)
BOWL SMART MIX CTS (DISPOSABLE) IMPLANT
CNTNR URN SCR LID CUP LEK RST (MISCELLANEOUS) ×1 IMPLANT
COMP FEM SZ5 CRUC LEFT RETAIN (Orthopedic Implant) ×2 IMPLANT
COMPONENT FEM SZ5 CRU LT RETN (Orthopedic Implant) IMPLANT
CONT SPEC 4OZ STRL OR WHT (MISCELLANEOUS) ×2
COVER SURGICAL LIGHT HANDLE (MISCELLANEOUS) ×2 IMPLANT
CUFF TOURN SGL QUICK 34 (TOURNIQUET CUFF) ×2
CUFF TOURN SGL QUICK 42 (TOURNIQUET CUFF) IMPLANT
CUFF TRNQT CYL 34X4.125X (TOURNIQUET CUFF) ×1 IMPLANT
DECANTER SPIKE VIAL GLASS SM (MISCELLANEOUS) ×2 IMPLANT
DRAPE INCISE IOBAN 66X45 STRL (DRAPES) IMPLANT
DRAPE ORTHO SPLIT 77X108 STRL (DRAPES) ×6
DRAPE SURG ORHT 6 SPLT 77X108 (DRAPES) ×3 IMPLANT
DRAPE U-SHAPE 47X51 STRL (DRAPES) ×2 IMPLANT
DRSG AQUACEL AG ADV 3.5X14 (GAUZE/BANDAGES/DRESSINGS) ×1 IMPLANT
DURAPREP 26ML APPLICATOR (WOUND CARE) ×4 IMPLANT
ELECT CAUTERY BLADE 6.4 (BLADE) ×2 IMPLANT
ELECT REM PT RETURN 9FT ADLT (ELECTROSURGICAL) ×2
ELECTRODE REM PT RTRN 9FT ADLT (ELECTROSURGICAL) ×1 IMPLANT
GAUZE SPONGE 4X4 12PLY STRL (GAUZE/BANDAGES/DRESSINGS) ×1 IMPLANT
GLOVE SRG 8 PF TXTR STRL LF DI (GLOVE) ×1 IMPLANT
GLOVE SURG ENC MOIS LTX SZ6.5 (GLOVE) ×6 IMPLANT
GLOVE SURG LTX SZ8 (GLOVE) ×2 IMPLANT
GLOVE SURG UNDER POLY LF SZ7 (GLOVE) ×2 IMPLANT
GLOVE SURG UNDER POLY LF SZ8 (GLOVE) ×2
GOWN STRL REUS W/ TWL LRG LVL3 (GOWN DISPOSABLE) ×3 IMPLANT
GOWN STRL REUS W/TWL LRG LVL3 (GOWN DISPOSABLE) ×6
HANDPIECE INTERPULSE COAX TIP (DISPOSABLE) ×4
HOOD PEEL AWAY FLYTE STAYCOOL (MISCELLANEOUS) ×7 IMPLANT
IMMOBILIZER KNEE 20 (SOFTGOODS)
IMMOBILIZER KNEE 20 THIGH 36 (SOFTGOODS) IMPLANT
IMMOBILIZER KNEE 22 UNIV (SOFTGOODS) IMPLANT
IMMOBILIZER KNEE 24 THIGH 36 (MISCELLANEOUS) IMPLANT
IMMOBILIZER KNEE 24 UNIV (MISCELLANEOUS) ×2
INSERT TRIATH CS SZ6 11 (Insert) ×1 IMPLANT
INSERT TRIATHLON CS TIB X3 9 (Joint) ×1 IMPLANT
KIT BASIN OR (CUSTOM PROCEDURE TRAY) ×2 IMPLANT
KIT TURNOVER KIT B (KITS) ×2 IMPLANT
KNEE PATELLA ASYMMETRIC 10X32 (Knees) ×1 IMPLANT
KNEE TIBIAL COMPONENT SZ6 (Knees) ×1 IMPLANT
MANIFOLD NEPTUNE II (INSTRUMENTS) ×2 IMPLANT
NDL SPNL 18GX3.5 QUINCKE PK (NEEDLE) ×1 IMPLANT
NEEDLE 22X1 1/2 (OR ONLY) (NEEDLE) ×4 IMPLANT
NEEDLE SPNL 18GX3.5 QUINCKE PK (NEEDLE) ×2 IMPLANT
NS IRRIG 1000ML POUR BTL (IV SOLUTION) ×4 IMPLANT
PACK TOTAL JOINT (CUSTOM PROCEDURE TRAY) ×2 IMPLANT
PAD ARMBOARD 7.5X6 YLW CONV (MISCELLANEOUS) ×4 IMPLANT
PAD CAST 4YDX4 CTTN HI CHSV (CAST SUPPLIES) ×1 IMPLANT
PADDING CAST COTTON 4X4 STRL (CAST SUPPLIES)
PADDING CAST COTTON 6X4 STRL (CAST SUPPLIES) ×3 IMPLANT
PIN FLUTED HEDLESS FIX 3.5X1/8 (PIN) ×1 IMPLANT
SET HNDPC FAN SPRY TIP SCT (DISPOSABLE) ×1 IMPLANT
STRIP CLOSURE SKIN 1/2X4 (GAUZE/BANDAGES/DRESSINGS) ×3 IMPLANT
SUCTION FRAZIER HANDLE 10FR (MISCELLANEOUS) ×2
SUCTION TUBE FRAZIER 10FR DISP (MISCELLANEOUS) ×1 IMPLANT
SUT MNCRL AB 3-0 PS2 18 (SUTURE) ×3 IMPLANT
SUT VIC AB 0 CT1 27 (SUTURE) ×10
SUT VIC AB 0 CT1 27XBRD ANBCTR (SUTURE) ×3 IMPLANT
SUT VIC AB 1 CT1 36 (SUTURE) ×12 IMPLANT
SUT VIC AB 2-0 CT1 27 (SUTURE) ×10
SUT VIC AB 2-0 CT1 TAPERPNT 27 (SUTURE) ×4 IMPLANT
SYR 30ML LL (SYRINGE) ×6 IMPLANT
SYR TB 1ML LUER SLIP (SYRINGE) ×1 IMPLANT
TOWEL GREEN STERILE (TOWEL DISPOSABLE) ×4 IMPLANT
TOWEL GREEN STERILE FF (TOWEL DISPOSABLE) ×4 IMPLANT
TRAY CATH 16FR W/PLASTIC CATH (SET/KITS/TRAYS/PACK) IMPLANT
WATER STERILE IRR 1000ML POUR (IV SOLUTION) IMPLANT
YANKAUER SUCT BULB TIP NO VENT (SUCTIONS) ×2 IMPLANT

## 2021-11-20 NOTE — Anesthesia Postprocedure Evaluation (Signed)
Anesthesia Post Note  Patient: Chiropractor  Procedure(s) Performed: LEFT TOTAL KNEE ARTHROPLASTY (Left: Knee)     Patient location during evaluation: PACU Anesthesia Type: Spinal Level of consciousness: awake, awake and alert and oriented Pain management: pain level controlled Vital Signs Assessment: post-procedure vital signs reviewed and stable Respiratory status: spontaneous breathing, nonlabored ventilation and respiratory function stable Cardiovascular status: blood pressure returned to baseline and stable Postop Assessment: no headache, no backache, spinal receding and no apparent nausea or vomiting Anesthetic complications: no   No notable events documented.  Last Vitals:  Vitals:   11/20/21 1715 11/20/21 1730  BP: 115/73 120/88  Pulse: (!) 54 (!) 52  Resp: 14 17  Temp:    SpO2: 97% 95%    Last Pain:  Vitals:   11/20/21 1730  TempSrc:   PainSc: 0-No pain                 Santa Lighter

## 2021-11-20 NOTE — Progress Notes (Signed)
Orthopedic Tech Progress Note Patient Details:  Caleb Woodward Rile 06-09-1946 [id number]  CPM Left Knee CPM Left Knee: On Left Knee Flexion (Degrees): 10 Left Knee Extension (Degrees): 40  Post Interventions Patient Tolerated: Well Ortho Devices Type of Ortho Device: Bone foam zero knee, CPM padding Ortho Device/Splint Location: left Ortho Device/Splint Interventions: Ordered, Application, Adjustment   Post Interventions Patient Tolerated: Well  [name] [name] 11/20/2021, 4:52 PM Applied CPM and delivered bone foam. Pacu 4

## 2021-11-20 NOTE — Brief Op Note (Signed)
° °  11/20/2021  2:59 PM  PATIENT:  Caleb Woodward  76 y.o. male  PRE-OPERATIVE DIAGNOSIS:  left knee osteoarthritis  POST-OPERATIVE DIAGNOSIS:  left knee osteoarthritis  PROCEDURE:  Procedure(s): LEFT TOTAL KNEE ARTHROPLASTY  SURGEON:  Surgeon(s): Meredith Pel, MD  ASSISTANT: magnant pa  ANESTHESIA:   spinal  EBL: 100 ml    Total I/O In: -  Out: 50 [Blood:50]  BLOOD ADMINISTERED: none  DRAINS: none   LOCAL MEDICATIONS USED:  none  SPECIMEN:  No Specimen  COUNTS:  YES  TOURNIQUET:   Total Tourniquet Time Documented: Thigh (Left) - 72 minutes Total: Thigh (Left) - 72 minutes   DICTATION: .Other Dictation: Dictation Number done  PLAN OF CARE: Admit for overnight observation  PATIENT DISPOSITION:  PACU - hemodynamically stable

## 2021-11-20 NOTE — Anesthesia Preprocedure Evaluation (Addendum)
Anesthesia Evaluation  Patient identified by MRN, date of birth, ID band Patient awake    Reviewed: Allergy & Precautions, NPO status , Patient's Chart, lab work & pertinent test results  Airway Mallampati: II  TM Distance: >3 FB Neck ROM: Full    Dental  (+) Dental Advisory Given, Chipped   Pulmonary sleep apnea ,    Pulmonary exam normal breath sounds clear to auscultation       Cardiovascular negative cardio ROS Normal cardiovascular exam Rhythm:Regular Rate:Normal     Neuro/Psych  Headaches, S/p ACDF   Neuromuscular disease negative psych ROS   GI/Hepatic Neg liver ROS, hiatal hernia, GERD  ,  Endo/Other  negative endocrine ROS  Renal/GU negative Renal ROS     Musculoskeletal  (+) Arthritis ,   Abdominal   Peds  Hematology negative hematology ROS (+) Plt 314k   Anesthesia Other Findings Day of surgery medications reviewed with the patient.  Reproductive/Obstetrics                            Anesthesia Physical Anesthesia Plan  ASA: 2  Anesthesia Plan: Spinal   Post-op Pain Management: Regional block and Tylenol PO (pre-op)   Induction: Intravenous  PONV Risk Score and Plan: 1 and Propofol infusion, Dexamethasone and Ondansetron  Airway Management Planned: Natural Airway and Simple Face Mask  Additional Equipment:   Intra-op Plan:   Post-operative Plan:   Informed Consent: I have reviewed the patients History and Physical, chart, labs and discussed the procedure including the risks, benefits and alternatives for the proposed anesthesia with the patient or authorized representative who has indicated his/her understanding and acceptance.     Dental advisory given  Plan Discussed with: CRNA  Anesthesia Plan Comments:         Anesthesia Quick Evaluation

## 2021-11-20 NOTE — Op Note (Deleted)
  The note originally documented on this encounter has been moved the the encounter in which it belongs.  

## 2021-11-20 NOTE — Transfer of Care (Signed)
Immediate Anesthesia Transfer of Care Note  Patient: Caleb Woodward  Procedure(s) Performed: LEFT TOTAL KNEE ARTHROPLASTY (Left: Knee)  Patient Location: PACU  Anesthesia Type:Spinal and MAC combined with regional for post-op pain  Level of Consciousness: awake, alert  and oriented  Airway & Oxygen Therapy: Patient Spontanous Breathing  Post-op Assessment: Report given to RN, Post -op Vital signs reviewed and stable and Patient able to stick tongue midline  Post vital signs: Reviewed  Last Vitals:  Vitals Value Taken Time  BP 125/68   Temp 97.3   Pulse 70 11/20/21 1527  Resp 13 11/20/21 1527  SpO2 95 % 11/20/21 1527  Vitals shown include unvalidated device data.  Last Pain:  Vitals:   11/20/21 1035  TempSrc:   PainSc: 8       Patients Stated Pain Goal: 0 (70/05/25 9102)  Complications: No notable events documented.

## 2021-11-20 NOTE — Op Note (Signed)
NAMEAGUSTIN, SWATEK MEDICAL RECORD NO: [id number] ACCOUNT NO: [id number] DATE OF BIRTH: 13-Jun-1946 FACILITY: [facility] LOCATION: [facility] PHYSICIAN: Yetta Barre. Marlou Sa, MD  Operative Report   PREOPERATIVE DIAGNOSIS:  Left knee arthritis.  POSTOPERATIVE DIAGNOSIS:  Left knee arthritis.  PROCEDURE:  Left total knee replacement using Stryker press-fit components, size 5 posterior cruciate retaining femur, size 6 tibia press-fit with a 11 mm deep dish poly spacer and 32 mm 3-peg press-fit patella.  SURGEON:  Yetta Barre. Marlou Sa, MD  ASSISTANT:  Annie Main, PA  INDICATIONS:  The patient is a 76 year old patient with end-stage left knee arthritis, who presents for operative management after explanation of risks and benefits.  PROCEDURE IN DETAIL:  The patient was brought to the operating room where spinal anesthetic was induced.  Preoperative antibiotics were administered.  Timeout was called.  Left leg examined under anesthesia and found to have full extension and flexion of  120.  Left leg pre-scrubbed with alcohol, Betadine, and allowed to air dry.  Prepped with DuraPrep solution and draped in a sterile manner. Ioban used to cover the operative field.  The leg was elevated and exsanguinated with an Esmarch wrap, tourniquet  inflated to 300 mmHg.  Anterior approach to knee was made.  Skin and subcutaneous tissue were sharply divided.  IrriSept solution utilized.  Median parapatellar arthrotomy was made and marked with #1 Vicryl suture.  IrriSept solution utilized in the  arthrotomy.  Severe end-stage tricompartmental arthritis was present, worse on the medial side and patellofemoral joint.  The patella was everted.  Fat pad was partially excised.  Lateral patellofemoral ligament was released.  Minimal medial soft tissue  dissection was performed.  Next, tissue from the anterior distal femur was removed.  The Hohmann retractor was placed.  The anterior horn of the lateral meniscus was released.  Using  intramedullary alignment, a 9 mm cut was made perpendicular to the  mechanical axis off the least affected lateral tibial plateau.  Collateral ligaments and posterior neurovascular structures were protected.  Bone quality was excellent.  The Intramedullary alignment was then used to cut the distal femur, initially 8 mm  and 5 degrees of valgus, later on revised 2 more millimeters to achieve full extension with a 9 mm spacer. At this time, the anterior, posterior and chamfer cuts were made after sizing the femur to a size 5.  Tibia was then placed and pegs were placed in  the appropriate rotation.  Trial femur was placed.  A 9 mm trail spacer was placed.  The patient achieved full extension, good flexion.  Patella was then cut down from 26 to 15 mm and 3-peg patellar trial was placed.  The patella had excellent tracking  using no thumbs technique.  Good stability with varus and valgus stress at 0, 30 and 90 degrees present.  Trial components were removed after finishing preparation on both the femur and the tibia.  Thorough irrigation was performed.  Tranexamic acid  sponge was allowed to sit for 3 minutes along with the IrriSept solution into the knee.  Solution of Marcaine, Exparel and saline injected into the capsule of the knee for postop pain relief.  These were removed.  Three liters of irrigating solution  utilized prior to placement of the implants.  Vancomycin powder placed into the intramedullary canal of the femur and tibia.  True implant was then placed with excellent press fit obtained.  Initially, a 9 mm spacer was utilized, but this was later  revised to an 11 mm  spacer to achieve optimal extension gap balancing to varus and valgus stress at 0 and 30 degrees.  The patella tracked beautifully.  The tourniquet was released.  Bleeding points encountered were controlled using electrocautery.  Thorough  irrigation again performed with pouring irrigation.  The knee arthrotomy was closed over a  bolster using #1 Vicryl suture.  Prior to final arthrotomy closure, IrriSept solution utilized along with vancomycin powder placed into the joint.  The arthrotomy  was then closed completely using #1 Vicryl suture followed by interrupted inverted 0 Vicryl suture with vancomycin powder also placed in this layer.  Then, a 2-0 Vicryl suture, 3-0 Monocryl and Steri-Strips, Aquacel dressing applied.  The patient was  then placed in a bulky wrap, iceman dressing and a knee immobilizer.  He tolerated the procedure well without immediate complication and transferred to the recovery room in stable condition.  Luke's assistance was required for opening, closing,  mobilization of tissue.  His assistance was a medical necessity.  The patient tolerated the procedure well and was transferred to recovery room in stable condition.   NIK D: 11/20/2021 3:04:49 pm T: 11/20/2021 11:55:00 pm  JOB: [id number]/ [id number]

## 2021-11-20 NOTE — Anesthesia Procedure Notes (Signed)
Spinal  Patient location during procedure: OR Start time: 11/20/2021 12:38 PM End time: 11/20/2021 12:40 PM Reason for block: surgical anesthesia Staffing Performed: anesthesiologist  Anesthesiologist: Santa Lighter, MD Preanesthetic Checklist Completed: patient identified, IV checked, risks and benefits discussed, surgical consent, monitors and equipment checked, pre-op evaluation and timeout performed Spinal Block Patient position: sitting Prep: DuraPrep and site prepped and draped Patient monitoring: continuous pulse ox and blood pressure Approach: midline Location: L3-4 Injection technique: single-shot Needle Needle type: Pencan  Needle gauge: 24 G Assessment Events: CSF return Additional Notes Functioning IV was confirmed and monitors were applied. Sterile prep and drape, including hand hygiene, mask and sterile gloves were used. The patient was positioned and the spine was prepped. The skin was anesthetized with lidocaine.  Free flow of clear CSF was obtained prior to injecting local anesthetic into the CSF.  The spinal needle aspirated freely following injection.  The needle was carefully withdrawn.  The patient tolerated the procedure well. Consent was obtained prior to procedure with all questions answered and concerns addressed. Risks including but not limited to bleeding, infection, nerve damage, paralysis, failed block, inadequate analgesia, allergic reaction, high spinal, itching and headache were discussed and the patient wished to proceed.   Hoy Morn, MD

## 2021-11-20 NOTE — Anesthesia Procedure Notes (Signed)
Anesthesia Regional Block: Adductor canal block   Pre-Anesthetic Checklist: , timeout performed,  Correct Patient, Correct Site, Correct Laterality,  Correct Procedure, Correct Position, site marked,  Risks and benefits discussed,  Surgical consent,  Pre-op evaluation,  At surgeon's request and post-op pain management  Laterality: Left  Prep: chloraprep       Needles:  Injection technique: Single-shot  Needle Type: Echogenic Needle     Needle Length: 9cm  Needle Gauge: 21     Additional Needles:   Procedures:,,,, ultrasound used (permanent image in chart),,    Narrative:  Start time: 11/20/2021 11:55 AM End time: 11/20/2021 12:05 PM Injection made incrementally with aspirations every 5 mL.  Performed by: Personally  Anesthesiologist: Santa Lighter, MD  Additional Notes: No pain on injection. No increased resistance to injection. Injection made in 5cc increments.  Good needle visualization.  Patient tolerated procedure well.

## 2021-11-20 NOTE — H&P (Signed)
TOTAL KNEE ADMISSION H&P  Patient is being admitted for left total knee arthroplasty.  Subjective:  Chief Complaint:left knee pain.  HPI: Caleb Woodward, 76 y.o. male, has a history of pain and functional disability in the left knee due to arthritis and has failed non-surgical conservative treatments for greater than 12 weeks to includeNSAID's and/or analgesics, corticosteriod injections, viscosupplementation injections, flexibility and strengthening excercises, and activity modification.  Onset of symptoms was abrupt, starting 1 years ago with rapidlly worsening course since that time. The patient noted no past surgery on the left knee(s).  Patient currently rates pain in the left knee(s) at 9 out of 10 with activity. Patient has night pain, worsening of pain with activity and weight bearing, pain that interferes with activities of daily living, pain with passive range of motion, crepitus, and joint swelling.  Patient has evidence of subchondral sclerosis and joint space narrowing by imaging studies. This patient has had  meniscal root avulsion approximately a year ago and has had progressive symptomatic development of arthritis since that time.  Primarily on the medial aspect of the knee but also describes global pain. . There is no active infection.  Patient Active Problem List   Diagnosis Date Noted   Abnormality of gait 01/21/2021   Tendonitis 08/29/2020   Plantar fasciitis 08/29/2020   Neuropathic pain 08/29/2020   Myalgia 02/29/2020   Spondylosis without myelopathy or radiculopathy, lumbar region 12/26/2019   Facet arthropathy 11/28/2019   Failed back syndrome 09/05/2019   Slow heart rate 08/30/2018   Chronic pain syndrome 07/27/2018   Obstructive sleep apnea 09/01/2017   Insomnia 09/01/2017   RBD (REM behavioral disorder) 09/01/2017   Lumbar stenosis 09/15/2016   Cervical stenosis of spinal canal 02/06/2014   Past Medical History:  Diagnosis Date   Cancer ([place]) 2012   Prostate  cancer   Elevated cholesterol    GERD (gastroesophageal reflux disease)    H/O hiatal hernia    Headache(784.0)    due to accident in the past   Neuromuscular disorder ([place])    numbness and tingling   Sleep apnea     Past Surgical History:  Procedure Laterality Date   ANTERIOR CERVICAL DECOMP/DISCECTOMY FUSION N/A 02/06/2014   Procedure: CERVICAL FIVE TO CERVICAL SIX, CERVICAL SIX TO CERVICAL SEVEN  ANTERIOR CERVICAL DECOMPRESSION/DISCECTOMY FUSION 2 LEVELS;  Surgeon: Floyce Stakes, MD;  Location: [facility];  Service: Neurosurgery;  Laterality: N/A;  C5-6 C6-7 Anterior cervical decompression/diskectomy/fusion   COLONOSCOPY W/ BIOPSIES AND POLYPECTOMY     LUMBAR LAMINECTOMY/DECOMPRESSION MICRODISCECTOMY Left 09/15/2016   Procedure: LEFT LUMBAR FOUR-FIVE, LUMBAR FIVE-SACRAL ONE MICRODISCECTOMY;  Surgeon: Leeroy Cha, MD;  Location: [place];  Service: Neurosurgery;  Laterality: Left;  LEFT L4-5 L5-S1 MICRODISCECTOMY   PROSTATE SURGERY  11/27/2010   ULNAR NERVE REPAIR Left 2014    Current Facility-Administered Medications  Medication Dose Route Frequency Provider Last Rate Last Admin   ceFAZolin (ANCEF) 2-4 GM/100ML-% IVPB            ceFAZolin (ANCEF) IVPB 2g/100 mL premix  2 g Intravenous On Call to OR Magnant, Charles L, PA-C       fentaNYL (SUBLIMAZE) 100 MCG/2ML injection            lactated ringers infusion   Intravenous Continuous Santa Lighter, MD 10 mL/hr at 11/20/21 1104 New Bag at 11/20/21 1104   midazolam (VERSED) 2 MG/2ML injection            povidone-iodine (BETADINE) 7.5 % scrub  Topical Once Magnant, Charles L, PA-C       povidone-iodine 10 % swab 2 application  2 application Topical Once Magnant, Charles L, PA-C       tranexamic acid (CYKLOKAPRON) 1000MG /133mL IVPB            tranexamic acid (CYKLOKAPRON) 2,000 mg in sodium chloride 0.9 % 50 mL Topical Application  4,034 mg Topical To OR Marlou Sa, Tonna Corner, MD       tranexamic acid (CYKLOKAPRON) IVPB 1,000 mg  1,000  mg Intravenous To OR Magnant, Charles L, PA-C       Allergies  Allergen Reactions   Cymbalta [Duloxetine Hcl] Nausea And Vomiting   Meloxicam Nausea And Vomiting    Social History   Tobacco Use   Smoking status: Never   Smokeless tobacco: Never  Substance Use Topics   Alcohol use: Yes    Comment: socially but rarely    Family History  Problem Relation Age of Onset   Diabetes Mother      Review of Systems  Musculoskeletal:  Positive for arthralgias.  All other systems reviewed and are negative.  Objective:  Physical Exam Vitals reviewed.  HENT:     Head: Normocephalic.     Nose: Nose normal.     Mouth/Throat:     Mouth: Mucous membranes are moist.  Eyes:     Pupils: Pupils are equal, round, and reactive to light.  Cardiovascular:     Rate and Rhythm: Normal rate.     Pulses: Normal pulses.  Pulmonary:     Effort: Pulmonary effort is normal.  Abdominal:     General: Abdomen is flat.  Musculoskeletal:     Cervical back: Normal range of motion.  Skin:    General: Skin is warm.     Capillary Refill: Capillary refill takes less than 2 seconds.  Neurological:     General: No focal deficit present.     Mental Status: He is alert.  Psychiatric:        Mood and Affect: Mood normal.   Left knee is examined.  Mild effusion is present.  Extensor mechanism intact.  No groin pain with internal or external Tatian of the leg.  Range of motion is about 4 to 120 degrees.  Collateral and cruciate ligaments are stable.  Patient has medial and lateral joint line tenderness.  Pedal pulses palpable.  Ankle dorsiflexion intact. Vital signs in last 24 hours: Temp:  [98.7 F (37.1 C)] 98.7 F (37.1 C) (01/19 1018) Pulse Rate:  [66] 66 (01/19 1018) Resp:  [18] 18 (01/19 1018) BP: (153)/(91) 153/91 (01/19 1018) SpO2:  [96 %] 96 % (01/19 1018) Weight:  [96.2 kg] 96.2 kg (01/19 1018)  Labs:   Estimated body mass index is 29.99 kg/m as calculated from the following:   Height as  of this encounter: 5' 10.5" (1.791 m).   Weight as of this encounter: 96.2 kg.   Imaging Review Plain radiographs demonstrate moderate degenerative joint disease of the left knee(s). The overall alignment ismild varus. The bone quality appears to be good for age and reported activity level.      Assessment/Plan:  End stage arthritis, left knee   The patient history, physical examination, clinical judgment of the provider and imaging studies are consistent with end stage degenerative joint disease of the left knee(s) and total knee arthroplasty is deemed medically necessary. The treatment options including medical management, injection therapy arthroscopy and arthroplasty were discussed at length. The risks and benefits of  total knee arthroplasty were presented and reviewed. The risks due to aseptic loosening, infection, stiffness, patella tracking problems, thromboembolic complications and other imponderables were discussed. The patient acknowledged the explanation, agreed to proceed with the plan and consent was signed. Patient is being admitted for inpatient treatment for surgery, pain control, PT, OT, prophylactic antibiotics, VTE prophylaxis, progressive ambulation and ADL's and discharge planning. The patient is planning to be discharged home with home health services     Patient's anticipated LOS is less than 2 midnights, meeting these requirements: - Younger than 68 - Lives within 1 hour of care - Has a competent adult at home to recover with post-op recover - NO history of  - Chronic pain requiring opiods  - Diabetes  - Coronary Artery Disease  - Heart failure  - Heart attack  - Stroke  - DVT/VTE  - Cardiac arrhythmia  - Respiratory Failure/COPD  - Renal failure  - Anemia  - Advanced Liver disease

## 2021-11-21 ENCOUNTER — Encounter (HOSPITAL_COMMUNITY): Payer: Self-pay | Admitting: Orthopedic Surgery

## 2021-11-21 DIAGNOSIS — R030 Elevated blood-pressure reading, without diagnosis of hypertension: Secondary | ICD-10-CM | POA: Diagnosis not present

## 2021-11-21 DIAGNOSIS — Z888 Allergy status to other drugs, medicaments and biological substances status: Secondary | ICD-10-CM | POA: Diagnosis not present

## 2021-11-21 DIAGNOSIS — I10 Essential (primary) hypertension: Secondary | ICD-10-CM | POA: Diagnosis present

## 2021-11-21 DIAGNOSIS — Z981 Arthrodesis status: Secondary | ICD-10-CM | POA: Diagnosis not present

## 2021-11-21 DIAGNOSIS — K219 Gastro-esophageal reflux disease without esophagitis: Secondary | ICD-10-CM | POA: Diagnosis present

## 2021-11-21 DIAGNOSIS — Z8546 Personal history of malignant neoplasm of prostate: Secondary | ICD-10-CM | POA: Diagnosis not present

## 2021-11-21 DIAGNOSIS — M1712 Unilateral primary osteoarthritis, left knee: Secondary | ICD-10-CM | POA: Diagnosis present

## 2021-11-21 DIAGNOSIS — Z96652 Presence of left artificial knee joint: Secondary | ICD-10-CM

## 2021-11-21 DIAGNOSIS — M25562 Pain in left knee: Secondary | ICD-10-CM | POA: Diagnosis present

## 2021-11-21 DIAGNOSIS — Z79899 Other long term (current) drug therapy: Secondary | ICD-10-CM | POA: Diagnosis not present

## 2021-11-21 DIAGNOSIS — E7800 Pure hypercholesterolemia, unspecified: Secondary | ICD-10-CM | POA: Diagnosis present

## 2021-11-21 DIAGNOSIS — G894 Chronic pain syndrome: Secondary | ICD-10-CM | POA: Diagnosis present

## 2021-11-21 DIAGNOSIS — G4733 Obstructive sleep apnea (adult) (pediatric): Secondary | ICD-10-CM | POA: Diagnosis present

## 2021-11-21 LAB — CBC
HCT: 35 % — ABNORMAL LOW (ref 39.0–52.0)
Hemoglobin: 11.5 g/dL — ABNORMAL LOW (ref 13.0–17.0)
MCH: 27.4 pg (ref 26.0–34.0)
MCHC: 32.9 g/dL (ref 30.0–36.0)
MCV: 83.3 fL (ref 80.0–100.0)
Platelets: 302 10*3/uL (ref 150–400)
RBC: 4.2 MIL/uL — ABNORMAL LOW (ref 4.22–5.81)
RDW: 13.2 % (ref 11.5–15.5)
WBC: 10.3 10*3/uL (ref 4.0–10.5)
nRBC: 0 % (ref 0.0–0.2)

## 2021-11-21 LAB — BASIC METABOLIC PANEL
Anion gap: 10 (ref 5–15)
BUN: 13 mg/dL (ref 8–23)
CO2: 23 mmol/L (ref 22–32)
Calcium: 8.8 mg/dL — ABNORMAL LOW (ref 8.9–10.3)
Chloride: 104 mmol/L (ref 98–111)
Creatinine, Ser: 1.18 mg/dL (ref 0.61–1.24)
GFR, Estimated: >60 mL/min (ref >60)
Glucose, Bld: 202 mg/dL — ABNORMAL HIGH (ref 70–99)
Potassium: 4.1 mmol/L (ref 3.5–5.1)
Sodium: 137 mmol/L (ref 135–145)

## 2021-11-21 MED ORDER — PROPOFOL 10 MG/ML IV BOLUS
INTRAVENOUS | Status: AC
  Filled 2021-11-21: qty 20

## 2021-11-21 MED ORDER — PHENYLEPHRINE HCL-NACL 20-0.9 MG/250ML-% IV SOLN
INTRAVENOUS | Status: AC
  Filled 2021-11-21: qty 250

## 2021-11-21 MED ORDER — FENTANYL CITRATE (PF) 250 MCG/5ML IJ SOLN
INTRAMUSCULAR | Status: AC
  Filled 2021-11-21: qty 5

## 2021-11-21 MED ORDER — AMLODIPINE BESYLATE 5 MG PO TABS
5.0000 mg | ORAL_TABLET | Freq: Every day | ORAL | Status: DC
  Filled 2021-11-21: qty 1

## 2021-11-21 MED ORDER — HYDRALAZINE HCL 20 MG/ML IJ SOLN
10.0000 mg | INTRAMUSCULAR | Status: DC | PRN
  Administered 2021-11-21 – 2021-11-22 (×2): 10 mg via INTRAVENOUS
  Filled 2021-11-21 (×2): qty 1

## 2021-11-21 MED ORDER — MIDAZOLAM HCL 2 MG/2ML IJ SOLN
INTRAMUSCULAR | Status: AC
  Filled 2021-11-21: qty 2

## 2021-11-21 NOTE — Evaluation (Addendum)
Physical Therapy Evaluation Patient Details Name: Caleb Woodward MRN: [id number] DOB: 01/27/1946 Today's Date: 11/21/2021  History of Present Illness  Pt is a 76 y.o. male s/p L TKA secondary to OA. PMH: RBD (REM behavioral disorder), insomnia, chronic pain symdrome, h/o back sx   Clinical Impression  Pt is s/p TKA resulting in the deficits listed below (see PT Problem List). PTA pt independent with mobility and ADLs, living at home with his wife in [place] house with level entry. He has a flight of stairs to access bedroom, but reports he and his wife have made arrangements for him to stay on the main level for a few days upon d/c from the hospital. On eval, pt required min assist bed mobility, min assist transfers, and min guard assist ambulation 40' with RW. L knee immobilizer in place for ambulation. Pt reports he has all needed DME at home and his wife is able to provide needed level of assist. Pt will benefit from skilled PT to increase their independence and safety with mobility to allow discharge to the venue listed below.         Recommendations for follow up therapy are one component of a multi-disciplinary discharge planning process, led by the attending physician.  Recommendations may be updated based on patient status, additional functional criteria and insurance authorization.  Follow Up Recommendations Follow physician's recommendations for discharge plan and follow up therapies    Assistance Recommended at Discharge Frequent or constant Supervision/Assistance  Patient can return home with the following  A little help with walking and/or transfers;Assistance with cooking/housework;Assist for transportation;A little help with bathing/dressing/bathroom;Help with stairs or ramp for entrance    Equipment Recommendations None recommended by PT (Pt has all needed DME.)  Recommendations for Other Services       Functional Status Assessment Patient has had a recent decline in their  functional status and demonstrates the ability to make significant improvements in function in a reasonable and predictable amount of time.     Precautions / Restrictions Precautions Precautions: Fall;Knee Precaution Comments: educated on no pillow under knee Required Braces or Orthoses: Knee Immobilizer - Left Knee Immobilizer - Left:  (no wear schedule indicated in order set) Restrictions Weight Bearing Restrictions: Yes LLE Weight Bearing: Weight bearing as tolerated Other Position/Activity Restrictions: CPM, zero degree bone foam      Mobility  Bed Mobility Overal bed mobility: Needs Assistance Bed Mobility: Supine to Sit     Supine to sit: Min assist, HOB elevated     General bed mobility comments: +rail, increased time, cues for sequencing, assist with LLE    Transfers Overall transfer level: Needs assistance Equipment used: Rolling Madej (2 wheels) Transfers: Sit to/from Stand Sit to Stand: Min assist           General transfer comment: assist to power up and stabilize balance, cues for hand placement and sequencing    Ambulation/Gait Ambulation/Gait assistance: Min guard Gait Distance (Feet): 40 Feet Assistive device: Rolling Badgett (2 wheels) Gait Pattern/deviations: Step-to pattern, Step-through pattern, Decreased stride length, Antalgic, Decreased weight shift to left Gait velocity: decreased Gait velocity interpretation: <1.8 ft/sec, indicate of risk for recurrent falls   General Gait Details: no increase in pain with ambulation, cues for sequencing, KI in place  Stairs            Wheelchair Mobility    Modified Rankin (Stroke Patients Only)       Balance Overall balance assessment: Needs assistance Sitting-balance support: No upper extremity  supported, Feet supported Sitting balance-Leahy Scale: Good     Standing balance support: Bilateral upper extremity supported, During functional activity, Reliant on assistive device for  balance Standing balance-Leahy Scale: Poor                               Pertinent Vitals/Pain Pain Assessment Pain Assessment: 0-10 Pain Score: 8  Pain Descriptors / Indicators: Grimacing, Operative site guarding, Discomfort Pain Intervention(s): Limited activity within patient's tolerance, Monitored during session, Repositioned, Patient requesting pain meds-RN notified, Ice applied    Home Living Family/patient expects to be discharged to:: Private residence Living Arrangements: Spouse/significant other Available Help at Discharge: Family;Available 24 hours/day Type of Home: House Home Access: Level entry     Alternate Level Stairs-Number of Steps: 13 Home Layout: Two level;Able to live on main level with bedroom/bathroom Home Equipment: Rolling Bohnsack (2 wheels);Cane - single point;Shower seat;Toilet riser Additional Comments: DME is from pt's MIL    Prior Function Prior Level of Function : Independent/Modified Independent                     Hand Dominance   Dominant Hand: Right    Extremity/Trunk Assessment   Upper Extremity Assessment Upper Extremity Assessment: Defer to OT evaluation    Lower Extremity Assessment Lower Extremity Assessment: LLE deficits/detail LLE Deficits / Details: s/p TKA    Cervical / Trunk Assessment Cervical / Trunk Assessment: Normal  Communication   Communication: No difficulties  Cognition Arousal/Alertness: Awake/alert Behavior During Therapy: WFL for tasks assessed/performed Overall Cognitive Status: Within Functional Limits for tasks assessed                                          General Comments General comments (skin integrity, edema, etc.): Pt placed on 2L overnight due to elevated BP. Mobilized on RA. HR 92. SpO2 94%. BP 179/86. 2L replaced in recliner due to BP remaining elevated.    Exercises Total Joint Exercises Ankle Circles/Pumps: AROM, Both, 10 reps, Supine Quad Sets:  AROM, Both, 10 reps, Supine Heel Slides: AAROM, Left, 10 reps, Supine Goniometric ROM: 10-60 degrees L knee   Assessment/Plan    PT Assessment Patient needs continued PT services  PT Problem List Decreased mobility;Decreased range of motion;Decreased strength;Decreased activity tolerance;Decreased balance;Decreased knowledge of use of DME;Pain       PT Treatment Interventions DME instruction;Therapeutic activities;Gait training;Therapeutic exercise;Patient/family education;Stair training;Balance training;Functional mobility training    PT Goals (Current goals can be found in the Care Plan section)  Acute Rehab PT Goals Patient Stated Goal: home PT Goal Formulation: With patient Time For Goal Achievement: 12/05/21 Potential to Achieve Goals: Good    Frequency 7X/week     Co-evaluation               AM-PAC PT "6 Clicks" Mobility  Outcome Measure Help needed turning from your back to your side while in a flat bed without using bedrails?: A Little Help needed moving from lying on your back to sitting on the side of a flat bed without using bedrails?: A Little Help needed moving to and from a bed to a chair (including a wheelchair)?: A Little Help needed standing up from a chair using your arms (e.g., wheelchair or bedside chair)?: A Little Help needed to walk in hospital room?: A Little Help needed  climbing 3-5 steps with a railing? : A Lot 6 Click Score: 17    End of Session Equipment Utilized During Treatment: Gait belt;Oxygen Activity Tolerance: Patient tolerated treatment well Patient left: in chair;with call bell/phone within reach Nurse Communication: Mobility status;Patient requests pain meds PT Visit Diagnosis: Unsteadiness on feet (R26.81);Difficulty in walking, not elsewhere classified (R26.2);Pain Pain - Right/Left: Left Pain - part of body: Knee    Time: 6701-4103 PT Time Calculation (min) (ACUTE ONLY): 45 min   Charges:   PT Evaluation $PT Eval  Moderate Complexity: 1 Mod PT Treatments $Gait Training: 8-22 mins $Therapeutic Exercise: 8-22 mins        Lorrin Goodell, PT  Office # [phone] Pager [phone]   Lorriane Shire 11/21/2021, 9:20 AM

## 2021-11-21 NOTE — Progress Notes (Signed)
Physical Therapy Treatment Patient Details Name: Caleb Woodward MRN: [id number] DOB: 1946/05/02 Today's Date: 11/21/2021   History of Present Illness Pt is a 76 y.o. male s/p L TKA secondary to OA. PMH: RBD (REM behavioral disorder), insomnia, chronic pain symdrome, h/o back sx    PT Comments    Pt requiring min assist bed mobility and transfers. Gait deferred due to pt having just received IV dilaudid. Wife present in room. HEP handouts provided and reviewed with wife (due to pt's lethargy from pain meds). Educated wife on importance of knee resting in neutral/extended position and use of bone foam, CPM, and knee immobilizer. Wife relays that their desire if for pt to be able to manage flight of stairs to bedroom/handicap bathroom upon d/c home. Otherwise, pt will need to sleep on couch on main level and have access to full bath with tub/shower combo. No steps to enter house.   Recommendations for follow up therapy are one component of a multi-disciplinary discharge planning process, led by the attending physician.  Recommendations may be updated based on patient status, additional functional criteria and insurance authorization.  Follow Up Recommendations  Follow physician's recommendations for discharge plan and follow up therapies     Assistance Recommended at Discharge Frequent or constant Supervision/Assistance  Patient can return home with the following A little help with walking and/or transfers;Assistance with cooking/housework;Assist for transportation;A little help with bathing/dressing/bathroom;Help with stairs or ramp for entrance   Equipment Recommendations  None recommended by PT (Pt has all needed DME.)    Recommendations for Other Services       Precautions / Restrictions Precautions Precautions: Fall;Knee Precaution Comments: educated on no pillow under knee Required Braces or Orthoses: Knee Immobilizer - Left Knee Immobilizer - Left:  (no wear schedule indicated in  order set) Restrictions Weight Bearing Restrictions: Yes LLE Weight Bearing: Weight bearing as tolerated Other Position/Activity Restrictions: CPM, zero degree bone foam     Mobility  Bed Mobility Overal bed mobility: Needs Assistance Bed Mobility: Sit to Supine     Supine to sit: Min assist, HOB elevated Sit to supine: Min assist   General bed mobility comments: +rail, increased time, cues for sequencing, assist with LLE    Transfers Overall transfer level: Needs assistance Equipment used: Rolling Benevides (2 wheels) Transfers: Sit to/from Stand, Bed to chair/wheelchair/BSC Sit to Stand: Min assist   Step pivot transfers: Min assist       General transfer comment: assist to power up and stabilize balance, cues for hand placement and sequencing    Ambulation/Gait Ambulation/Gait assistance: Min guard Gait Distance (Feet): 40 Feet Assistive device: Rolling Soltys (2 wheels) Gait Pattern/deviations: Step-to pattern, Step-through pattern, Decreased stride length, Antalgic, Decreased weight shift to left Gait velocity: decreased Gait velocity interpretation: <1.8 ft/sec, indicate of risk for recurrent falls   General Gait Details: gait deferred in pain due to lethargy from pain meds   Stairs             Wheelchair Mobility    Modified Rankin (Stroke Patients Only)       Balance Overall balance assessment: Needs assistance Sitting-balance support: No upper extremity supported, Feet supported Sitting balance-Leahy Scale: Good     Standing balance support: Bilateral upper extremity supported, During functional activity, Reliant on assistive device for balance Standing balance-Leahy Scale: Poor                              Cognition  Arousal/Alertness: Lethargic, Suspect due to medications Behavior During Therapy: WFL for tasks assessed/performed Overall Cognitive Status: Within Functional Limits for tasks assessed                                  General Comments: Pt received IV dilaudid just prior to 2nd session.        Exercises Total Joint Exercises Ankle Circles/Pumps: AROM, Both, 10 reps, Supine Quad Sets: AROM, Both, 10 reps, Supine Heel Slides: AAROM, Left, 10 reps, Supine Goniometric ROM: 10-60 degrees L knee    General Comments General comments (skin integrity, edema, etc.): Pt placed on 2L overnight due to elevated BP. Mobilized on RA. HR 92. SpO2 94%. BP 179/86. 2L replaced in recliner due to BP remaining elevated.      Pertinent Vitals/Pain Pain Assessment Pain Assessment: Faces Pain Score: 8  Faces Pain Scale: Hurts a little bit Pain Location: L knee Pain Descriptors / Indicators: Grimacing, Operative site guarding, Discomfort Pain Intervention(s): Premedicated before session    Home Living Family/patient expects to be discharged to:: Private residence Living Arrangements: Spouse/significant other Available Help at Discharge: Family;Available 24 hours/day Type of Home: House Home Access: Level entry     Alternate Level Stairs-Number of Steps: 13 Home Layout: Two level Home Equipment: Conservation officer, nature (2 wheels);Cane - single point;Shower seat;Toilet riser Additional Comments: DME is from pt's MIL. Pt would be able to stay on the main level by sleeping on the couching and having access to full bath with tub/shower combo. Bedroom and handicap accessible bathroom with walk-in shower are upstairs.    Prior Function            PT Goals (current goals can now be found in the care plan section) Acute Rehab PT Goals Patient Stated Goal: home PT Goal Formulation: With patient Time For Goal Achievement: 12/05/21 Potential to Achieve Goals: Good Progress towards PT goals: Progressing toward goals    Frequency    7X/week      PT Plan Current plan remains appropriate    Co-evaluation              AM-PAC PT "6 Clicks" Mobility   Outcome Measure  Help needed turning from  your back to your side while in a flat bed without using bedrails?: A Little Help needed moving from lying on your back to sitting on the side of a flat bed without using bedrails?: A Little Help needed moving to and from a bed to a chair (including a wheelchair)?: A Little Help needed standing up from a chair using your arms (e.g., wheelchair or bedside chair)?: A Little Help needed to walk in hospital room?: A Little Help needed climbing 3-5 steps with a railing? : A Lot 6 Click Score: 17    End of Session Equipment Utilized During Treatment: Gait belt Activity Tolerance: Patient limited by lethargy Patient left: in bed;with call bell/phone within reach;with family/visitor present Nurse Communication: Mobility status PT Visit Diagnosis: Unsteadiness on feet (R26.81);Difficulty in walking, not elsewhere classified (R26.2);Pain Pain - Right/Left: Left Pain - part of body: Knee     Time: 1056-1106 PT Time Calculation (min) (ACUTE ONLY): 10 min  Charges:  $Gait Training: 8-22 mins $Therapeutic Exercise: 8-22 mins $Therapeutic Activity: 8-22 mins                     Lorrin Goodell, PT  Office # [phone] Pager [phone]  Lorriane Shire 11/21/2021, 11:45 AM

## 2021-11-21 NOTE — Progress Notes (Signed)
°  Subjective: Caleb Woodward is a 76 y.o. male s/p left TKA.  They are POD 1.  Pt's pain is controlled.  Pt denies numbness/tingling/weakness.  Pt has ambulated with some difficulty.  Perform pretty well with physical therapy this morning.  No symptomatic lightheadedness or dizziness when ambulating.  Denies any chest pain, shortness of breath, headache, calf pain, abdominal pain.  Only complaint is his left knee hurts but he states that pain is actually a lot better than he was expecting.  He does have elevated blood pressure with systolic of 546.  He reports that he has not had hypertension in the past that he has had to have medically managed.   Objective: Vital signs in last 24 hours: Temp:  [97.8 F (36.6 C)-98.7 F (37.1 C)] 98.7 F (37.1 C) (01/20 0503) Pulse Rate:  [50-92] 92 (01/20 0818) Resp:  [12-20] 20 (01/20 0503) BP: (92-179)/(57-88) 179/86 (01/20 0818) SpO2:  [94 %-98 %] 94 % (01/20 0818)  Intake/Output from previous day: 01/19 0701 - 01/20 0700 In: 1685 [I.V.:1485; IV Piggyback:200] Out: 300 [Urine:250; Blood:50] Intake/Output this shift: No intake/output data recorded.  Exam:  No gross blood or drainage overlying the dressing 2+ DP pulse Sensation intact distally in the left foot Able to dorsiflex and plantarflex the left foot No calf tenderness.  Negative Homans' sign.  Not able to perform straight leg raise today.   Labs: Recent Labs    11/21/21 0226  HGB 11.5*   Recent Labs    11/21/21 0226  WBC 10.3  RBC 4.20*  HCT 35.0*  PLT 302   Recent Labs    11/21/21 0226  NA 137  K 4.1  CL 104  CO2 23  BUN 13  CREATININE 1.18  GLUCOSE 202*  CALCIUM 8.8*   No results for input(s): LABPT, INR in the last 72 hours.  Assessment/Plan: Pt is POD 1 s/p left TKA.    -Plan to discharge to home in coming days pending patient's pain and PT eval  -WBAT with a [place]  -Ordered hospitalist consult for evaluation of postoperative hypertension.  Appreciate their  input.     [name] L Kinte Trim 11/21/2021, 12:50 PM

## 2021-11-21 NOTE — Care Plan (Signed)
Ortho Bundle Case Management Note  Patient Details  Name: Caleb Woodward MRN: [id number] Date of Birth: 10-19-1946  Office RNCM call to patient prior to his upcoming Left total knee arthroplasty with Dr. Marlou Sa on 11/20/21. He is an Ortho bundle patient through [facility] and is agreeable to case management. He lives with his spouse, who will be assisting after discharge. He will need stair training to be able to go to an upstairs bedroom/bathroom. 12 steps to get upstairs in his home. Otherwise, he will be sleeping on downstairs sofa if unable to get to upstairs bedroom. He has a RW as well as elevated toilets. CPM has been delivered by [employer] for use after surgery. Anticipate HHPT will be needed after short hospital stay. Referral made to [facility] after choice provided. Reviewed post op care instructions with his wife. Will continue to follow for needs.                 DME Arranged:   (Has RW and elevated toilets in the home; CPM delivered prior to surgery.) DME Agency:  [employer]  HH Arranged:  PT HH Agency:  [facility]  Additional Comments: Please contact me with any questions of if this plan should need to change.  Jamse Arn, RN, BSN, [employer]  [phone] 11/21/2021, 1:26 PM

## 2021-11-21 NOTE — Consult Note (Addendum)
[facility] Medical Consultation  Caleb Woodward FGH:[id number] DOB: 1945/12/12 DOA: 11/20/2021 PCP: Maryella Shivers, MD   Requesting physician: Marcene Duos, MD Date of consultation: 11/21/2021 Reason for consultation: Elevated blood pressure  Impression/Recommendations Principal Problem:   S/P total knee arthroplasty, left    Status post left total knee arthroplasty: Patient is postop day 1 from a left total knee arthroplasty with Dr. Marlou Sa. -Pain control -Per Orthopedic Surgery  Hypertension: Today his blood pressure was elevated up to 179/86.  Patient reports that pain causes blood pressure to be elevated.  Him and his wife deny patient ever being on any blood pressure medications in the past.  Review of records note that yesterday blood pressures were maintained anywhere from 105/68-132/68 throughout the day. -Would not recommend starting a blood pressure medication at this time -Recommend pain control -Hydralazine IV added as needed for systolic blood pressure greater than 180 -Advise patient follow-up with his primary care provider in regards to blood pressure to be followed in outpatient setting   Thank you for this consultation. Please contact [facility] for any additional needs.   Chief Complaint: Left knee pain  HPI:  Caleb Woodward is a 76 y.o. male with medical history significant of hyperlipidemia, prostate cancer, sleep apnea, osteoarthritis, and GERD who presented and underwent total left knee replacement 1/19 by Dr. Marlou Sa.  During his hospital stay patient blood pressure was noted to be elevated up to 179/86.  Talked with the patient and his wife over the phone who was a previous registered nurse.  Patient denies any history of hypertension and had not been on any medications for treatment previously from what he can remember.  His wife endorses that the only time his blood pressures have been elevated is usually when he is in pain.  After procedure patient reported that he  had gone a couple hours hours without pain medication and basically has been playing catch up ever since.  His wife reports that normally his systolic blood pressures are between 130-140s.     Review of Systems  Musculoskeletal:  Positive for joint pain.    Past Medical History:  Diagnosis Date   Cancer ([place]) 2012   Prostate cancer   Elevated cholesterol    GERD (gastroesophageal reflux disease)    H/O hiatal hernia    Headache(784.0)    due to accident in the past   Neuromuscular disorder (HCC)    numbness and tingling   Sleep apnea    Past Surgical History:  Procedure Laterality Date   ANTERIOR CERVICAL DECOMP/DISCECTOMY FUSION N/A 02/06/2014   Procedure: CERVICAL FIVE TO CERVICAL SIX, CERVICAL SIX TO CERVICAL SEVEN  ANTERIOR CERVICAL DECOMPRESSION/DISCECTOMY FUSION 2 LEVELS;  Surgeon: Floyce Stakes, MD;  Location: [facility];  Service: Neurosurgery;  Laterality: N/A;  C5-6 C6-7 Anterior cervical decompression/diskectomy/fusion   COLONOSCOPY W/ BIOPSIES AND POLYPECTOMY     LUMBAR LAMINECTOMY/DECOMPRESSION MICRODISCECTOMY Left 09/15/2016   Procedure: LEFT LUMBAR FOUR-FIVE, LUMBAR FIVE-SACRAL ONE MICRODISCECTOMY;  Surgeon: Leeroy Cha, MD;  Location: [place];  Service: Neurosurgery;  Laterality: Left;  LEFT L4-5 L5-S1 MICRODISCECTOMY   PROSTATE SURGERY  11/27/2010   ULNAR NERVE REPAIR Left 2014   Social History:  reports that he has never smoked. He has never used smokeless tobacco. He reports current alcohol use. He reports that he does not use drugs.  Allergies  Allergen Reactions   Cymbalta [Duloxetine Hcl] Nausea And Vomiting   Meloxicam Nausea And Vomiting   Family History  Problem Relation Age of Onset  Diabetes Mother     Prior to Admission medications   Medication Sig Start Date End Date Taking? Authorizing Provider  acetaminophen-codeine (TYLENOL #3) 300-30 MG tablet Take 1 tablet by mouth every 6 (six) hours as needed for moderate pain. Patient taking  differently: Take 1 tablet by mouth daily as needed for moderate pain. 11/04/21  Yes Meredith Pel, MD  b complex vitamins tablet Take 1 tablet by mouth daily.   Yes [provider]  Cholecalciferol 125 MCG (5000 UT) capsule Take 5,000 Units by mouth daily.   Yes [provider]  citalopram (CELEXA) 10 MG tablet Take 10 mg by mouth daily.   Yes [provider]  clonazePAM (KLONOPIN) 0.5 MG tablet TAKE 1 TO 3 TABLETS BY MOUTH EVERY AT BEDTIME AS NEEDED SLEEP Patient taking differently: Take 1.5 mg by mouth at bedtime. 05/20/21  Yes Young, Tarri Fuller D, MD  diclofenac Sodium (VOLTAREN) 1 % GEL Apply 2 g topically 4 (four) times daily. Patient taking differently: Apply 2 g topically 4 (four) times daily as needed (knee pain). 02/27/21  Yes Bayard Hugger, NP  ezetimibe (ZETIA) 10 MG tablet Take 10 mg by mouth daily.   Yes [provider]  Magnesium 400 MG TABS Take 400 mg by mouth daily.   Yes [provider]  Multiple Vitamin (MULTIVITAMIN WITH MINERALS) TABS tablet Take 1 tablet by mouth daily.   Yes [provider]  rosuvastatin (CRESTOR) 5 MG tablet Take 5 mg by mouth daily.   Yes [provider]  gabapentin (NEURONTIN) 600 MG tablet Take 1 tablet (600 mg total) by mouth 3 (three) times daily. Patient not taking: Reported on 11/12/2021 01/21/21   Jamse Arn, MD  traMADol Veatrice Bourbon) 50 MG tablet 1 po bid prn pain Patient not taking: Reported on 11/12/2021 10/23/21   Meredith Pel, MD   Physical Exam:  Constitutional: Elderly male currently in no acute distress Vitals:   11/20/21 2341 11/21/21 0027 11/21/21 0503 11/21/21 0818  BP: (!) 145/65 (!) 142/62 (!) 171/72 (!) 179/86  Pulse: 88 79 83 92  Resp: 18 18 20    Temp: 98.2 F (36.8 C) 98.2 F (36.8 C) 98.7 F (37.1 C)   TempSrc: Oral Oral Oral   SpO2: 98% 94% 95% 94%  Weight:      Height:       Eyes: PERRL, lids and conjunctivae normal ENMT: Mucous membranes are  moist. Posterior pharynx clear of any exudate or lesions.  Neck: normal, supple, no masses, no thyromegaly Respiratory: clear to auscultation bilaterally, no wheezing, no crackles. Normal respiratory effort. No accessory muscle use.  Cardiovascular: Regular rate and rhythm, no murmurs / rubs / gallops.  Skin: Left knee dressing in place  Psychiatric: Normal judgment and insight. Alert and oriented x 3. Normal mood.   Labs on Admission:  Basic Metabolic Panel: Recent Labs  Lab 11/17/21 0830 11/21/21 0226  NA 138 137  K 4.5 4.1  CL 103 104  CO2 25 23  GLUCOSE 83 202*  BUN 12 13  CREATININE 1.04 1.18  CALCIUM 9.4 8.8*   Liver Function Tests: No results for input(s): AST, ALT, ALKPHOS, BILITOT, PROT, ALBUMIN in the last 168 hours. No results for input(s): LIPASE, AMYLASE in the last 168 hours. No results for input(s): AMMONIA in the last 168 hours. CBC: Recent Labs  Lab 11/17/21 0830 11/21/21 0226  WBC 6.4 10.3  HGB 13.3 11.5*  HCT 40.6 35.0*  MCV 83.9 83.3  PLT 314  302   Cardiac Enzymes: No results for input(s): CKTOTAL, CKMB, CKMBINDEX, TROPONINI in the last 168 hours. BNP: Invalid input(s): POCBNP CBG: Recent Labs  Lab 11/17/21 0758  GLUCAP 112*    Radiological Exams on Admission: No results found.  EKG: Independently reviewed.  Normal sinus rhythm at 65 bpm  Time spent: >45 minutes  [facility]   If 7PM-7AM, please contact night-coverage

## 2021-11-21 NOTE — Progress Notes (Signed)
Mobility Specialist Criteria Algorithm Info. ° ° ° 11/21/21 1430  °Mobility  °Activity Ambulated with assistance in room  °Range of Motion/Exercises Active;All extremities  °Level of Assistance Minimal assist, patient does 75% or more  °Assistive Device Front wheel Hedstrom  °LLE Weight Bearing WBAT  °Distance Ambulated (ft) 55 ft  °Activity Response Tolerated well  ° °Patient received in bed eager and motivated to participate in mobility. Required minimal HHA to EOB, more so assisting LLE. Stood supervision needing cues for hand placement. Ambulated in room with slow steady gait. Tolerated ambulation better then prior session with minimal complaints of pain. Was left in bed with all needs met and wife present.  ° °11/21/2021 °4:09 PM ° ° , CMS, BS EXP °Acute Rehabilitation Services  °Phone:[phone] °Office: [phone] ° °

## 2021-11-21 NOTE — Plan of Care (Signed)
°  Problem: Education: Goal: Knowledge of General Education information will improve Description: Including pain rating scale, medication(s)/side effects and non-pharmacologic comfort measures 11/21/2021 1144 by Ricardo Jericho, RN Outcome: Progressing 11/21/2021 1144 by Ricardo Jericho, RN Outcome: Progressing   Problem: Education: Goal: Knowledge of General Education information will improve Description: Including pain rating scale, medication(s)/side effects and non-pharmacologic comfort measures 11/21/2021 1144 by Ricardo Jericho, RN Outcome: Progressing 11/21/2021 1144 by Ricardo Jericho, RN Outcome: Progressing   Problem: Clinical Measurements: Goal: Ability to maintain clinical measurements within normal limits will improve Outcome: Progressing   Problem: Activity: Goal: Risk for activity intolerance will decrease 11/21/2021 1144 by Ricardo Jericho, RN Outcome: Progressing 11/21/2021 1144 by Ricardo Jericho, RN Outcome: Progressing   Problem: Nutrition: Goal: Adequate nutrition will be maintained Outcome: Progressing   Problem: Coping: Goal: Level of anxiety will decrease Outcome: Progressing   Problem: Elimination: Goal: Will not experience complications related to bowel motility Outcome: Progressing

## 2021-11-22 LAB — CBC
HCT: 37.2 % — ABNORMAL LOW (ref 39.0–52.0)
Hemoglobin: 12.4 g/dL — ABNORMAL LOW (ref 13.0–17.0)
MCH: 27.7 pg (ref 26.0–34.0)
MCHC: 33.3 g/dL (ref 30.0–36.0)
MCV: 83 fL (ref 80.0–100.0)
Platelets: 280 10*3/uL (ref 150–400)
RBC: 4.48 MIL/uL (ref 4.22–5.81)
RDW: 13.4 % (ref 11.5–15.5)
WBC: 12.9 10*3/uL — ABNORMAL HIGH (ref 4.0–10.5)
nRBC: 0 % (ref 0.0–0.2)

## 2021-11-22 MED ORDER — OXYCODONE HCL 5 MG PO TABS
5.0000 mg | ORAL_TABLET | ORAL | 0 refills | Status: DC | PRN
Start: 2021-11-22 — End: 2021-11-26

## 2021-11-22 MED ORDER — ASPIRIN 81 MG PO CHEW: 81.0000 mg | CHEWABLE_TABLET | Freq: Two times a day (BID) | ORAL | 0 refills | Status: DC

## 2021-11-22 MED ORDER — DOCUSATE SODIUM 100 MG PO CAPS: 100.0000 mg | ORAL_CAPSULE | Freq: Two times a day (BID) | ORAL | 0 refills | Status: DC

## 2021-11-22 MED ORDER — TIZANIDINE HCL 2 MG PO TABS: 2.0000 mg | ORAL_TABLET | Freq: Three times a day (TID) | ORAL | 0 refills | Status: DC | PRN

## 2021-11-22 NOTE — Care Management (Signed)
Patient is an ortho bundle w [place], DME set up prior to admission

## 2021-11-22 NOTE — Progress Notes (Signed)
Physical Therapy Treatment Patient Details Name: Caleb Woodward MRN: [id number] DOB: 12-Feb-1946 Today's Date: 11/22/2021   History of Present Illness Pt is a 76 y.o. male s/p L TKA secondary to OA. PMH: RBD (REM behavioral disorder), insomnia, chronic pain symdrome, h/o back sx    PT Comments    Pt complaining of moderate post-operative L knee pain, states he has been doing his CPM this am. Pt ambulatory in for short hallway distance, overall requiring close guard to light assist for bed mobility, transfers, and gait. Pt requires frequent safety cues, wife present during session. PT to see for pm session.     Recommendations for follow up therapy are one component of a multi-disciplinary discharge planning process, led by the attending physician.  Recommendations may be updated based on patient status, additional functional criteria and insurance authorization.  Follow Up Recommendations  Follow physician's recommendations for discharge plan and follow up therapies     Assistance Recommended at Discharge Frequent or constant Supervision/Assistance  Patient can return home with the following A little help with walking and/or transfers;Assistance with cooking/housework;Assist for transportation;A little help with bathing/dressing/bathroom;Help with stairs or ramp for entrance   Equipment Recommendations  None recommended by PT    Recommendations for Other Services       Precautions / Restrictions Precautions Precautions: Fall;Knee Restrictions LLE Weight Bearing: Weight bearing as tolerated Other Position/Activity Restrictions: CPM, zero degree bone foam     Mobility  Bed Mobility Overal bed mobility: Needs Assistance Bed Mobility: Supine to Sit, Sit to Supine     Supine to sit: Min assist, HOB elevated Sit to supine: Min assist, HOB elevated   General bed mobility comments: assist for LLE progression into and out of bed, increased time and effort with cues for sequencing.     Transfers Overall transfer level: Needs assistance Equipment used: Rolling [place] (2 wheels) Transfers: Sit to/from Stand Sit to Stand: Min assist           General transfer comment: assist for power up, rise, steadying. Cues for safe hand placement when rising/sitting.    Ambulation/Gait Ambulation/Gait assistance: Min guard Gait Distance (Feet): 60 Feet Assistive device: Rolling Miao (2 wheels) Gait Pattern/deviations: Step-to pattern, Step-through pattern, Decreased stride length, Antalgic, Decreased weight shift to left Gait velocity: decr     General Gait Details: cues for upright posture, placement in RW, sequencing gait   Stairs             Wheelchair Mobility    Modified Rankin (Stroke Patients Only)       Balance Overall balance assessment: Needs assistance Sitting-balance support: No upper extremity supported, Feet supported Sitting balance-Leahy Scale: Good     Standing balance support: Bilateral upper extremity supported, During functional activity, Reliant on assistive device for balance Standing balance-Leahy Scale: Poor                              Cognition Arousal/Alertness: Awake/alert Behavior During Therapy: WFL for tasks assessed/performed Overall Cognitive Status: Within Functional Limits for tasks assessed                                 General Comments: requires seqeuncing cues throughout mobility        Exercises Total Joint Exercises Quad Sets: AROM, Both, 10 reps, Supine Heel Slides: AAROM, Left, 10 reps, Supine Goniometric ROM: 10-50 degrees L knee  AAROM    General Comments        Pertinent Vitals/Pain Pain Assessment Pain Assessment: Faces Faces Pain Scale: Hurts little more Pain Location: L knee Pain Descriptors / Indicators: Grimacing, Operative site guarding, Discomfort Pain Intervention(s): Limited activity within patient's tolerance, Monitored during session, Repositioned     Home Living                          Prior Function            PT Goals (current goals can now be found in the care plan section) Acute Rehab PT Goals Patient Stated Goal: home PT Goal Formulation: With patient Time For Goal Achievement: 12/05/21 Potential to Achieve Goals: Good Progress towards PT goals: Progressing toward goals    Frequency    7X/week      PT Plan Current plan remains appropriate    Co-evaluation              AM-PAC PT "6 Clicks" Mobility   Outcome Measure  Help needed turning from your back to your side while in a flat bed without using bedrails?: A Little Help needed moving from lying on your back to sitting on the side of a flat bed without using bedrails?: A Little Help needed moving to and from a bed to a chair (including a wheelchair)?: A Little Help needed standing up from a chair using your arms (e.g., wheelchair or bedside chair)?: A Little Help needed to walk in hospital room?: A Little Help needed climbing 3-5 steps with a railing? : A Little 6 Click Score: 18    End of Session   Activity Tolerance: Patient tolerated treatment well Patient left: in bed;with call bell/phone within reach;with family/visitor present;with bed alarm set Nurse Communication: Mobility status PT Visit Diagnosis: Unsteadiness on feet (R26.81);Difficulty in walking, not elsewhere classified (R26.2);Pain Pain - Right/Left: Left Pain - part of body: Knee     Time: 3299-2426 PT Time Calculation (min) (ACUTE ONLY): 24 min  Charges:  $Gait Training: 8-22 mins $Therapeutic Exercise: 8-22 mins                     Stacie Glaze, PT DPT Acute Rehabilitation Services Pager [phone]  Office [phone]    [place] E Ruffin Pyo 11/22/2021, 11:05 AM

## 2021-11-22 NOTE — Progress Notes (Signed)
°  Subjective: Patient doing well.  Range of motion left knee improving in CPM machine.  Pain controlled this morning.   Objective: Vital signs in last 24 hours: Temp:  [99 F (37.2 C)-99.3 F (37.4 C)] 99.1 F (37.3 C) (01/21 0817) Pulse Rate:  [85-99] 99 (01/21 0817) Resp:  [15-19] 17 (01/21 0817) BP: (131-171)/(84-92) 168/87 (01/21 0817) SpO2:  [96 %-98 %] 97 % (01/21 0817)  Intake/Output from previous day: No intake/output data recorded. Intake/Output this shift: No intake/output data recorded.  Exam:  Sensation intact distally Intact pulses distally  Labs: Recent Labs    11/21/21 0226 11/22/21 0144  HGB 11.5* 12.4*   Recent Labs    11/21/21 0226 11/22/21 0144  WBC 10.3 12.9*  RBC 4.20* 4.48  HCT 35.0* 37.2*  PLT 302 280   Recent Labs    11/21/21 0226  NA 137  K 4.1  CL 104  CO2 23  BUN 13  CREATININE 1.18  GLUCOSE 202*  CALCIUM 8.8*   No results for input(s): LABPT, INR in the last 72 hours.  Assessment/Plan: Plan at this time is continue with physical therapy today.  Patient has 13 steps at home.  I anticipate discharge tomorrow after morning physical therapy.  Overall patient is making progress.  Still required IV pain medicine last night.   Landry Dyke Kaylean Tupou 11/22/2021, 8:56 AM

## 2021-11-22 NOTE — Plan of Care (Signed)
  Problem: Education: Goal: Knowledge of General Education information will improve Description: Including pain rating scale, medication(s)/side effects and non-pharmacologic comfort measures Outcome: Progressing   Problem: Pain Managment: Goal: General experience of comfort will improve Outcome: Progressing   Problem: Safety: Goal: Ability to remain free from injury will improve Outcome: Progressing   

## 2021-11-22 NOTE — Plan of Care (Signed)
°  Problem: Education: Goal: Knowledge of General Education information will improve Description: Including pain rating scale, medication(s)/side effects and non-pharmacologic comfort measures Outcome: Adequate for Discharge   Problem: Clinical Measurements: Goal: Ability to maintain clinical measurements within normal limits will improve Outcome: Adequate for Discharge   Problem: Activity: Goal: Risk for activity intolerance will decrease Outcome: Adequate for Discharge   Problem: Nutrition: Goal: Adequate nutrition will be maintained Outcome: Adequate for Discharge   Problem: Pain Managment: Goal: General experience of comfort will improve Outcome: Adequate for Discharge

## 2021-11-22 NOTE — Progress Notes (Signed)
Physical Therapy Treatment Patient Details Name: Caleb Woodward MRN: [id number] DOB: 1946/04/22 Today's Date: 11/22/2021   History of Present Illness Pt is a 76 y.o. male s/p L TKA secondary to OA. PMH: RBD (REM behavioral disorder), insomnia, chronic pain symdrome, h/o back sx    PT Comments    Pt progressing slowly but well, continues to require sequencing and safety cues during mobility. PT administered TKA HEP and reviewed/practiced with pt, tolerated well. PT to continue to follow, needs to practice steps with wife present.     Recommendations for follow up therapy are one component of a multi-disciplinary discharge planning process, led by the attending physician.  Recommendations may be updated based on patient status, additional functional criteria and insurance authorization.  Follow Up Recommendations  Follow physician's recommendations for discharge plan and follow up therapies     Assistance Recommended at Discharge Frequent or constant Supervision/Assistance  Patient can return home with the following A little help with walking and/or transfers;Assistance with cooking/housework;Assist for transportation;A little help with bathing/dressing/bathroom;Help with stairs or ramp for entrance   Equipment Recommendations  None recommended by PT    Recommendations for Other Services       Precautions / Restrictions Precautions Precautions: Fall;Knee Restrictions LLE Weight Bearing: Weight bearing as tolerated Other Position/Activity Restrictions: CPM, zero degree bone foam     Mobility  Bed Mobility Overal bed mobility: Needs Assistance Bed Mobility: Supine to Sit, Sit to Supine     Supine to sit: Min assist, HOB elevated Sit to supine: Min assist, HOB elevated   General bed mobility comments: assist for LE progression to EOB, sequencing    Transfers Overall transfer level: Needs assistance Equipment used: Rolling Erland[place] (2 wheels) Transfers: Sit to/from Stand Sit  to Stand: Min guard           General transfer comment: close guard for safety, cues for safe hand placement when moving stand>sit.    Ambulation/Gait Ambulation/Gait assistance: Supervision Gait Distance (Feet): 90 Feet Assistive device: Rolling Agne (2 wheels) Gait Pattern/deviations: Step-through pattern, Decreased stride length, Antalgic, Decreased weight shift to left Gait velocity: decr     General Gait Details: cues for upright posture, placement in RW, sequencing gait   Stairs             Wheelchair Mobility    Modified Rankin (Stroke Patients Only)       Balance Overall balance assessment: Needs assistance Sitting-balance support: No upper extremity supported, Feet supported Sitting balance-Leahy Scale: Good     Standing balance support: Bilateral upper extremity supported, During functional activity, Reliant on assistive device for balance Standing balance-Leahy Scale: Poor                              Cognition Arousal/Alertness: Awake/alert Behavior During Therapy: WFL for tasks assessed/performed Overall Cognitive Status: Within Functional Limits for tasks assessed                                 General Comments: requires seqeuncing cues throughout mobility, some STM deficits noted        Exercises Total Joint Exercises Ankle Circles/Pumps: AROM, Both, 10 reps, Seated Quad Sets: AROM, Supine, 5 reps, Left Short Arc Quad: AAROM, Left, 5 reps, Seated Heel Slides: AAROM, Left, 10 reps, Supine Hip ABduction/ADduction: AAROM, Left, 5 reps, Seated Goniometric ROM: 10-50 degrees L knee AAROM  General Comments        Pertinent Vitals/Pain Pain Assessment Pain Assessment: 0-10 Pain Score: 4  Faces Pain Scale: Hurts little more Pain Location: L knee Pain Descriptors / Indicators: Grimacing, Operative site guarding, Discomfort Pain Intervention(s): Limited activity within patient's tolerance, Monitored during  session, Repositioned    Home Living                          Prior Function            PT Goals (current goals can now be found in the care plan section) Acute Rehab PT Goals Patient Stated Goal: home PT Goal Formulation: With patient Time For Goal Achievement: 12/05/21 Potential to Achieve Goals: Good Progress towards PT goals: Progressing toward goals    Frequency    7X/week      PT Plan Current plan remains appropriate    Co-evaluation              AM-PAC PT "6 Clicks" Mobility   Outcome Measure  Help needed turning from your back to your side while in a flat bed without using bedrails?: A Little Help needed moving from lying on your back to sitting on the side of a flat bed without using bedrails?: A Little Help needed moving to and from a bed to a chair (including a wheelchair)?: A Little Help needed standing up from a chair using your arms (e.g., wheelchair or bedside chair)?: A Little Help needed to walk in hospital room?: A Little Help needed climbing 3-5 steps with a railing? : A Little 6 Click Score: 18    End of Session   Activity Tolerance: Patient tolerated treatment well Patient left: in bed;with call bell/phone within reach;with family/visitor present;with bed alarm set Nurse Communication: Mobility status PT Visit Diagnosis: Unsteadiness on feet (R26.81);Difficulty in walking, not elsewhere classified (R26.2);Pain Pain - Right/Left: Left Pain - part of body: Knee     Time: 3762-8315 PT Time Calculation (min) (ACUTE ONLY): 24 min  Charges:  $Gait Training: 8-22 mins $Therapeutic Exercise: 8-22 mins                     Stacie Glaze, PT DPT Acute Rehabilitation Services Pager [phone]  Office [phone]   Louis Matte 11/22/2021, 4:57 PM

## 2021-11-23 NOTE — Discharge Instructions (Signed)
° ° °   Discharge Instructions      Diagnosis and Procedures:    Surgeries Performed: Left total knee replacement  Discharge Plan:    Diet: Resume usual diet. Begin with light or bland foods.  Drink plenty of fluids.  Activity:  Weight bearing as tolerated You are advised to go home directly from the hospital or surgical center. Restrict your activities. WOUND INSTRUCTIONS:   Leave your dressing/cast/splint in place until your post operative visit.  Keep it clean and dry.  Always keep the incision clean and dry until the staples/sutures are removed. If there is no drainage from the incision you should keep it open to air. If there is drainage from the incision you must keep it covered at all times until the drainage stops  Do not soak in a bath tub, hot tub, pool, lake or other body of water until 21 days after your surgery and your incision is completely dry and healed.  If you have removable sutures (or staples) they must be removed 10-14 days (unless otherwise instructed) from the day of your surgery.     1)  Elevate the extremity as much as possible.  2)  Keep the dressing clean and dry.  3)  Please call us if the dressing becomes wet or dirty.  4)  If you are experiencing worsening pain or worsening swelling, please call.

## 2021-11-23 NOTE — Progress Notes (Signed)
Pt stable Pain ok PT pending Ready for dc today later - rx sent in yesterday

## 2021-11-23 NOTE — Progress Notes (Signed)
°  Subjective: Pain improved today. Difficulty sleeping overnight. Tolerating diet well. No n/v  Objective: Vital signs in last 24 hours: Temp:  [98.4 F (36.9 C)-99.1 F (37.3 C)] 98.5 F (36.9 C) (01/22 0558) Pulse Rate:  [92-104] 92 (01/22 0558) Resp:  [14-18] 14 (01/22 0558) BP: (144-168)/(75-87) 144/78 (01/22 0558) SpO2:  [95 %-100 %] 95 % (01/22 0558)  Intake/Output from previous day: No intake/output data recorded. Intake/Output this shift: No intake/output data recorded.  Exam:  Sensation intact distally Intact pulses distally  Labs: Recent Labs    11/21/21 0226 11/22/21 0144  HGB 11.5* 12.4*    Recent Labs    11/21/21 0226 11/22/21 0144  WBC 10.3 12.9*  RBC 4.20* 4.48  HCT 35.0* 37.2*  PLT 302 280    Recent Labs    11/21/21 0226  NA 137  K 4.1  CL 104  CO2 23  BUN 13  CREATININE 1.18  GLUCOSE 202*  CALCIUM 8.8*    No results for input(s): LABPT, INR in the last 72 hours.  Assessment/Plan: Plan at this time is continue with physical therapy today. Plan on DC home today    [facility] 11/23/2021, 8:15 AM

## 2021-11-23 NOTE — Progress Notes (Signed)
Physical Therapy Treatment Patient Details Name: Caleb Woodward MRN: [id number] DOB: 14-Jun-1946 Today's Date: 11/23/2021   History of Present Illness Pt is a 76 y.o. male s/p L TKA secondary to OA. PMH: RBD (REM behavioral disorder), insomnia, chronic pain symdrome, h/o back sx    PT Comments    Continuing work on functional mobility and activity tolerance;  Session focused on R knee stance stability with progressive amb, with notable improvements; still, he did have one instance of R knee buckle in stance, but Caleb Woodward was able to support himself on RW and prevent fall; Will plan to discuss benefits and drawbacks of using KI with pt and his wife (is an Therapist, sports) next session; Pt has a level entry and is planning to stay on main level initially and work with his stairs at home with HHPT; On track for dc home later today  Recommendations for follow up therapy are one component of a multi-disciplinary discharge planning process, led by the attending physician.  Recommendations may be updated based on patient status, additional functional criteria and insurance authorization.  Follow Up Recommendations  Follow physician's recommendations for discharge plan and follow up therapies     Assistance Recommended at Discharge Frequent or constant Supervision/Assistance  Patient can return home with the following A little help with walking and/or transfers;Assistance with cooking/housework;Assist for transportation;A little help with bathing/dressing/bathroom;Help with stairs or ramp for entrance   Equipment Recommendations  None recommended by PT    Recommendations for Other Services       Precautions / Restrictions Precautions Precautions: Fall;Knee Precaution Comments: educated on no pillow under knee Required Braces or Orthoses: Knee Immobilizer - Left Knee Immobilizer - Left:  (KI in room, but no wear schedule indicated in order set) Restrictions Weight Bearing Restrictions: Yes LLE Weight  Bearing: Weight bearing as tolerated     Mobility  Bed Mobility Overal bed mobility: Needs Assistance Bed Mobility: Supine to Sit     Supine to sit: Min assist, HOB elevated     General bed mobility comments: Min handheld assist to pull to sit    Transfers Overall transfer level: Needs assistance Equipment used: Rolling Ritter (2 wheels) Transfers: Sit to/from Stand Sit to Stand: Min guard, Mod assist           General transfer comment: close guard for safety, cues for safe hand placement when moving stand>sit; Sat back to bed without warning at initial stand, just after he had adjusted his mask out of his eyes, and lost his balance backward; Mod assist to control descent to sit in that intance; later, able to control descent to sit well    Ambulation/Gait Ambulation/Gait assistance: Supervision, Min guard Gait Distance (Feet): 120 Feet Assistive device: Rolling Loux (2 wheels) Gait Pattern/deviations: Step-through pattern, Decreased stride length, Antalgic, Decreased weight shift to left Gait velocity: decr     General Gait Details: cues for upright posture, foot placement/step length in RW, sequencing and to activate L quad for stance stabiltiy with gait; one instance of L knee buckle noted, pt able to support self on RW to prevent fall   Stairs             Wheelchair Mobility    Modified Rankin (Stroke Patients Only)       Balance     Sitting balance-Leahy Scale: Good       Standing balance-Leahy Scale: Poor  Cognition Arousal/Alertness: Awake/alert Behavior During Therapy: WFL for tasks assessed/performed Overall Cognitive Status: Within Functional Limits for tasks assessed                                 General Comments: requires min to mod seqeuncing cues; as well as cues to activate quad for stance stability; some STM deficits noted        Exercises      General Comments         Pertinent Vitals/Pain Pain Assessment Pain Assessment: 0-10 Pain Score: 3  Pain Location: L knee Pain Descriptors / Indicators: Sore Pain Intervention(s): Premedicated before session    Home Living                          Prior Function            PT Goals (current goals can now be found in the care plan section) Acute Rehab PT Goals Patient Stated Goal: home PT Goal Formulation: With patient Time For Goal Achievement: 12/05/21 Potential to Achieve Goals: Good Progress towards PT goals: Progressing toward goals    Frequency    7X/week      PT Plan Current plan remains appropriate    Co-evaluation              AM-PAC PT "6 Clicks" Mobility   Outcome Measure  Help needed turning from your back to your side while in a flat bed without using bedrails?: A Little Help needed moving from lying on your back to sitting on the side of a flat bed without using bedrails?: A Little Help needed moving to and from a bed to a chair (including a wheelchair)?: A Little Help needed standing up from a chair using your arms (e.g., wheelchair or bedside chair)?: A Little Help needed to walk in hospital room?: A Little Help needed climbing 3-5 steps with a railing? : A Little 6 Click Score: 18    End of Session Equipment Utilized During Treatment: Gait belt Activity Tolerance: Patient tolerated treatment well Patient left: in chair;with call bell/phone within reach Nurse Communication: Mobility status PT Visit Diagnosis: Unsteadiness on feet (R26.81);Difficulty in walking, not elsewhere classified (R26.2);Pain Pain - Right/Left: Left Pain - part of body: Knee     Time: 1030-1108 PT Time Calculation (min) (ACUTE ONLY): 38 min  Charges:  $Gait Training: 23-37 mins $Therapeutic Activity: 8-22 mins                    Roney Marion, PT  Acute Rehabilitation Services Pager [phone] Office [place] 11/23/2021, 11:19 AM

## 2021-11-23 NOTE — Progress Notes (Signed)
Discharge packet provided to [facility] with instructions. Spouse verbalized understanding of instructions. No complaints voiced. Pt d/c to home with [facility] PT as ordered. Pt remains alert/oriented in no acute distress. Spouse is responsible for pt's ride.

## 2021-11-24 ENCOUNTER — Telehealth: Payer: Self-pay | Admitting: *Deleted

## 2021-11-24 NOTE — Telephone Encounter (Signed)
Ortho bundle D/C call completed. 

## 2021-11-25 DIAGNOSIS — Z471 Aftercare following joint replacement surgery: Secondary | ICD-10-CM | POA: Diagnosis not present

## 2021-11-25 DIAGNOSIS — G4752 REM sleep behavior disorder: Secondary | ICD-10-CM | POA: Diagnosis not present

## 2021-11-25 DIAGNOSIS — M4802 Spinal stenosis, cervical region: Secondary | ICD-10-CM | POA: Diagnosis not present

## 2021-11-25 DIAGNOSIS — Z96652 Presence of left artificial knee joint: Secondary | ICD-10-CM | POA: Diagnosis not present

## 2021-11-25 DIAGNOSIS — M961 Postlaminectomy syndrome, not elsewhere classified: Secondary | ICD-10-CM | POA: Diagnosis not present

## 2021-11-25 DIAGNOSIS — G4700 Insomnia, unspecified: Secondary | ICD-10-CM | POA: Diagnosis not present

## 2021-11-25 DIAGNOSIS — M48061 Spinal stenosis, lumbar region without neurogenic claudication: Secondary | ICD-10-CM | POA: Diagnosis not present

## 2021-11-25 DIAGNOSIS — M47816 Spondylosis without myelopathy or radiculopathy, lumbar region: Secondary | ICD-10-CM | POA: Diagnosis not present

## 2021-11-25 DIAGNOSIS — G4733 Obstructive sleep apnea (adult) (pediatric): Secondary | ICD-10-CM | POA: Diagnosis not present

## 2021-11-25 DIAGNOSIS — G894 Chronic pain syndrome: Secondary | ICD-10-CM | POA: Diagnosis not present

## 2021-11-26 ENCOUNTER — Other Ambulatory Visit: Payer: Self-pay | Admitting: Orthopedic Surgery

## 2021-11-26 DIAGNOSIS — G4752 REM sleep behavior disorder: Secondary | ICD-10-CM | POA: Diagnosis not present

## 2021-11-26 DIAGNOSIS — Z471 Aftercare following joint replacement surgery: Secondary | ICD-10-CM | POA: Diagnosis not present

## 2021-11-26 DIAGNOSIS — G4733 Obstructive sleep apnea (adult) (pediatric): Secondary | ICD-10-CM | POA: Diagnosis not present

## 2021-11-26 DIAGNOSIS — G4700 Insomnia, unspecified: Secondary | ICD-10-CM | POA: Diagnosis not present

## 2021-11-26 DIAGNOSIS — M48061 Spinal stenosis, lumbar region without neurogenic claudication: Secondary | ICD-10-CM | POA: Diagnosis not present

## 2021-11-26 DIAGNOSIS — M4802 Spinal stenosis, cervical region: Secondary | ICD-10-CM | POA: Diagnosis not present

## 2021-11-27 ENCOUNTER — Telehealth: Payer: Self-pay | Admitting: Orthopedic Surgery

## 2021-11-27 MED ORDER — OXYCODONE HCL 5 MG PO TABS: 5.0000 mg | ORAL_TABLET | Freq: Four times a day (QID) | ORAL | 0 refills | Status: DC | PRN

## 2021-11-27 NOTE — Telephone Encounter (Signed)
Refilled oxycodone and msg sent via [employer]

## 2021-11-27 NOTE — Telephone Encounter (Signed)
Pt's wife Hilda Blades called requesting a refill of her husband oxycodone. Please send to pharmacy on file. Pt phone number is [phone].

## 2021-11-27 NOTE — Telephone Encounter (Signed)
Refilled pain med, message sent via [employer]

## 2021-11-28 DIAGNOSIS — Z471 Aftercare following joint replacement surgery: Secondary | ICD-10-CM | POA: Diagnosis not present

## 2021-11-28 DIAGNOSIS — G4700 Insomnia, unspecified: Secondary | ICD-10-CM | POA: Diagnosis not present

## 2021-11-28 DIAGNOSIS — M48061 Spinal stenosis, lumbar region without neurogenic claudication: Secondary | ICD-10-CM | POA: Diagnosis not present

## 2021-11-28 DIAGNOSIS — G4752 REM sleep behavior disorder: Secondary | ICD-10-CM | POA: Diagnosis not present

## 2021-11-28 DIAGNOSIS — M4802 Spinal stenosis, cervical region: Secondary | ICD-10-CM | POA: Diagnosis not present

## 2021-11-28 DIAGNOSIS — G4733 Obstructive sleep apnea (adult) (pediatric): Secondary | ICD-10-CM | POA: Diagnosis not present

## 2021-12-01 DIAGNOSIS — G4733 Obstructive sleep apnea (adult) (pediatric): Secondary | ICD-10-CM | POA: Diagnosis not present

## 2021-12-01 DIAGNOSIS — M4802 Spinal stenosis, cervical region: Secondary | ICD-10-CM | POA: Diagnosis not present

## 2021-12-01 DIAGNOSIS — G4752 REM sleep behavior disorder: Secondary | ICD-10-CM | POA: Diagnosis not present

## 2021-12-01 DIAGNOSIS — G4700 Insomnia, unspecified: Secondary | ICD-10-CM | POA: Diagnosis not present

## 2021-12-01 DIAGNOSIS — M48061 Spinal stenosis, lumbar region without neurogenic claudication: Secondary | ICD-10-CM | POA: Diagnosis not present

## 2021-12-01 DIAGNOSIS — Z471 Aftercare following joint replacement surgery: Secondary | ICD-10-CM | POA: Diagnosis not present

## 2021-12-02 ENCOUNTER — Telehealth: Payer: Self-pay | Admitting: *Deleted

## 2021-12-02 NOTE — Telephone Encounter (Signed)
Ortho bundle 7 day call completed. 

## 2021-12-03 DIAGNOSIS — G4752 REM sleep behavior disorder: Secondary | ICD-10-CM | POA: Diagnosis not present

## 2021-12-03 DIAGNOSIS — G4733 Obstructive sleep apnea (adult) (pediatric): Secondary | ICD-10-CM | POA: Diagnosis not present

## 2021-12-03 DIAGNOSIS — M48061 Spinal stenosis, lumbar region without neurogenic claudication: Secondary | ICD-10-CM | POA: Diagnosis not present

## 2021-12-03 DIAGNOSIS — G4700 Insomnia, unspecified: Secondary | ICD-10-CM | POA: Diagnosis not present

## 2021-12-03 DIAGNOSIS — Z471 Aftercare following joint replacement surgery: Secondary | ICD-10-CM | POA: Diagnosis not present

## 2021-12-03 DIAGNOSIS — M4802 Spinal stenosis, cervical region: Secondary | ICD-10-CM | POA: Diagnosis not present

## 2021-12-04 ENCOUNTER — Ambulatory Visit (INDEPENDENT_AMBULATORY_CARE_PROVIDER_SITE_OTHER): Payer: Medicare Other | Admitting: Orthopedic Surgery

## 2021-12-04 ENCOUNTER — Telehealth: Payer: Self-pay | Admitting: *Deleted

## 2021-12-04 ENCOUNTER — Encounter: Payer: Self-pay | Admitting: Orthopedic Surgery

## 2021-12-04 ENCOUNTER — Other Ambulatory Visit: Payer: Self-pay

## 2021-12-04 ENCOUNTER — Ambulatory Visit (INDEPENDENT_AMBULATORY_CARE_PROVIDER_SITE_OTHER): Payer: Medicare Other

## 2021-12-04 DIAGNOSIS — Z96652 Presence of left artificial knee joint: Secondary | ICD-10-CM

## 2021-12-04 MED ORDER — CELECOXIB 100 MG PO CAPS: 100.0000 mg | ORAL_CAPSULE | Freq: Two times a day (BID) | ORAL | 0 refills | Status: DC

## 2021-12-04 MED ORDER — HYDROCODONE-ACETAMINOPHEN 10-325 MG PO TABS
1.0000 | ORAL_TABLET | Freq: Three times a day (TID) | ORAL | 0 refills | Status: DC | PRN
Start: 2021-12-04 — End: 2021-12-31

## 2021-12-04 MED ORDER — METHOCARBAMOL 500 MG PO TABS: 500.0000 mg | ORAL_TABLET | Freq: Three times a day (TID) | ORAL | 0 refills | Status: DC | PRN

## 2021-12-04 NOTE — Telephone Encounter (Signed)
Ortho bundle 14 day in office meeting completed. See Ortho bundle flow sheet for details of visit.

## 2021-12-05 ENCOUNTER — Encounter: Payer: Self-pay | Admitting: Orthopedic Surgery

## 2021-12-05 ENCOUNTER — Other Ambulatory Visit: Payer: Self-pay | Admitting: Orthopedic Surgery

## 2021-12-05 ENCOUNTER — Telehealth: Payer: Self-pay | Admitting: Orthopedic Surgery

## 2021-12-05 ENCOUNTER — Telehealth: Payer: Self-pay | Admitting: Internal Medicine

## 2021-12-05 DIAGNOSIS — Z471 Aftercare following joint replacement surgery: Secondary | ICD-10-CM | POA: Diagnosis not present

## 2021-12-05 DIAGNOSIS — G4752 REM sleep behavior disorder: Secondary | ICD-10-CM | POA: Diagnosis not present

## 2021-12-05 DIAGNOSIS — G4700 Insomnia, unspecified: Secondary | ICD-10-CM | POA: Diagnosis not present

## 2021-12-05 DIAGNOSIS — M4802 Spinal stenosis, cervical region: Secondary | ICD-10-CM | POA: Diagnosis not present

## 2021-12-05 DIAGNOSIS — M48061 Spinal stenosis, lumbar region without neurogenic claudication: Secondary | ICD-10-CM | POA: Diagnosis not present

## 2021-12-05 DIAGNOSIS — G4733 Obstructive sleep apnea (adult) (pediatric): Secondary | ICD-10-CM | POA: Diagnosis not present

## 2021-12-05 MED ORDER — OXYCODONE HCL 5 MG PO TABS
10.0000 mg | ORAL_TABLET | Freq: Two times a day (BID) | ORAL | 0 refills | Status: DC | PRN
Start: 2021-12-05 — End: 2021-12-22

## 2021-12-05 MED ORDER — CLONAZEPAM 0.5 MG PO TABS: ORAL_TABLET | ORAL | 5 refills | Status: DC

## 2021-12-05 NOTE — Telephone Encounter (Signed)
Clonazepam refilled 

## 2021-12-05 NOTE — Progress Notes (Signed)
Post-Op Visit Note   Patient: Caleb Woodward           Date of Birth: 1946-06-22           MRN: [id number] Visit Date: 12/04/2021 PCP: Maryella Shivers, MD   Assessment & Plan:  Chief Complaint:  Chief Complaint  Patient presents with   Left Knee - Routine Post Op    left TKA  (surgery date 11-20-21)   Visit Diagnoses:  1. Status post total left knee replacement     Plan: Caleb Woodward is a patient underwent left total knee replacement 11/20/2021.  He has been doing marginally well following surgery.  He has been taking pain medicine for his postop pain.  He has also lost about 22 pounds since surgery.  He does state that he is moving the knee well.  He is at 110 on the CPM machine.  Going to home health physical therapy.  He is having some difficulty sleeping.  He was already on Klonopin to help him sleep.  Tizanidine has not been helping.  Cannot take Neurontin.  He is able to do stairs.  On examination the incision is intact.  Lacks about 7 or 8 degrees of full extension.  Bends to about 85 degrees cold today.  Collaterals are stable to varus and valgus stress at 0 and 30 degrees.  Pedal pulses palpable.  No calf tenderness negative Homans.  Overall hander's weight loss is concerning.  I think is likely related to the pain medicine.  We will try to taper him off oxycodone to hydrocodone at a lower dose and change tizanidine to Robaxin as the tizanidine is not agreeing with him.  We will also try him on Celebrex 100 mg twice a day for 14 days.  I think his longstanding use of Klonopin may be interfering with his sleep and nighttime pain.  Follow-up in 2 weeks for clinical recheck on knee range of motion as well as weight.  We will have to start tapering the pain medicine in the more significant way if the weight loss continues.  All this is explained to hander and his wife Caleb Woodward who is a Biomedical scientist and former Marine scientist.  Follow-Up Instructions: Return in about 2 weeks (around 12/18/2021).   Orders:   Orders Placed This Encounter  Procedures   XR Knee 1-2 Views Left   Meds ordered this encounter  Medications   celecoxib (CELEBREX) 100 MG capsule    Sig: Take 1 capsule (100 mg total) by mouth 2 (two) times daily.    Dispense:  60 capsule    Refill:  0   methocarbamol (ROBAXIN) 500 MG tablet    Sig: Take 1 tablet (500 mg total) by mouth every 8 (eight) hours as needed.    Dispense:  30 tablet    Refill:  0   HYDROcodone-acetaminophen (NORCO) 10-325 MG tablet    Sig: Take 1 tablet by mouth every 8 (eight) hours as needed.    Dispense:  30 tablet    Refill:  0    Imaging: No results found.  PMFS History: Patient Active Problem List   Diagnosis Date Noted   S/P total knee arthroplasty, left 11/20/2021   Abnormality of gait 01/21/2021   Tendonitis 08/29/2020   Plantar fasciitis 08/29/2020   Neuropathic pain 08/29/2020   Myalgia 02/29/2020   Spondylosis without myelopathy or radiculopathy, lumbar region 12/26/2019   Facet arthropathy 11/28/2019   Failed back syndrome 09/05/2019   Slow heart rate 08/30/2018   Chronic  pain syndrome 07/27/2018   Obstructive sleep apnea 09/01/2017   Insomnia 09/01/2017   RBD (REM behavioral disorder) 09/01/2017   Lumbar stenosis 09/15/2016   Cervical stenosis of spinal canal 02/06/2014   Past Medical History:  Diagnosis Date   Cancer ([place]) 2012   Prostate cancer   Elevated cholesterol    GERD (gastroesophageal reflux disease)    H/O hiatal hernia    Headache(784.0)    due to accident in the past   Neuromuscular disorder ([place])    numbness and tingling   Sleep apnea     Family History  Problem Relation Age of Onset   Diabetes Mother     Past Surgical History:  Procedure Laterality Date   ANTERIOR CERVICAL DECOMP/DISCECTOMY FUSION N/A 02/06/2014   Procedure: CERVICAL FIVE TO CERVICAL SIX, CERVICAL SIX TO CERVICAL SEVEN  ANTERIOR CERVICAL DECOMPRESSION/DISCECTOMY FUSION 2 LEVELS;  Surgeon: Floyce Stakes, MD;  Location: [facility];  Service: Neurosurgery;  Laterality: N/A;  C5-6 C6-7 Anterior cervical decompression/diskectomy/fusion   COLONOSCOPY W/ BIOPSIES AND POLYPECTOMY     LUMBAR LAMINECTOMY/DECOMPRESSION MICRODISCECTOMY Left 09/15/2016   Procedure: LEFT LUMBAR FOUR-FIVE, LUMBAR FIVE-SACRAL ONE MICRODISCECTOMY;  Surgeon: Leeroy Cha, MD;  Location: [facility];  Service: Neurosurgery;  Laterality: Left;  LEFT L4-5 L5-S1 MICRODISCECTOMY   PROSTATE SURGERY  11/27/2010   TOTAL KNEE ARTHROPLASTY Left 11/20/2021   Procedure: LEFT TOTAL KNEE ARTHROPLASTY;  Surgeon: Meredith Pel, MD;  Location: [facility];  Service: Orthopedics;  Laterality: Left;   ULNAR NERVE REPAIR Left 2014   Social History   Occupational History   Not on file  Tobacco Use   Smoking status: Never   Smokeless tobacco: Never  Vaping Use   Vaping Use: Never used  Substance and Sexual Activity   Alcohol use: Yes    Comment: socially but rarely   Drug use: No   Sexual activity: Not on file

## 2021-12-05 NOTE — Telephone Encounter (Signed)
Called and spoke with patient and informed him that medication has be refilled. No questions noted from patient. Nothing further needed

## 2021-12-05 NOTE — Telephone Encounter (Signed)
Dr. Marlou Sa replied to pt via mychart message.

## 2021-12-05 NOTE — Telephone Encounter (Signed)
Patient's wife called. She wants to make sure her mychart message was seen. Would like a call back. [phone]

## 2021-12-05 NOTE — Telephone Encounter (Signed)
Called and spoke with patient and he states he needs his Clonazepam reordered. I told patient that I would send dr Victorino Dike a message to refill this prescription since its a controlled substance and he would have to approve it. Verified pharmacy.  Dr Annamaria Boots I placed the order below if you would jsut review I and approve it based on your acceptance.   Follow up with you is Feb 20,2023.  Dr Annamaria Boots please advise

## 2021-12-06 DIAGNOSIS — M1712 Unilateral primary osteoarthritis, left knee: Secondary | ICD-10-CM

## 2021-12-08 DIAGNOSIS — M6281 Muscle weakness (generalized): Secondary | ICD-10-CM | POA: Diagnosis not present

## 2021-12-08 DIAGNOSIS — M25562 Pain in left knee: Secondary | ICD-10-CM | POA: Diagnosis not present

## 2021-12-10 ENCOUNTER — Encounter: Payer: Self-pay | Admitting: Orthopedic Surgery

## 2021-12-10 DIAGNOSIS — M25562 Pain in left knee: Secondary | ICD-10-CM | POA: Diagnosis not present

## 2021-12-10 DIAGNOSIS — M6281 Muscle weakness (generalized): Secondary | ICD-10-CM | POA: Diagnosis not present

## 2021-12-11 NOTE — Discharge Summary (Signed)
Physician Discharge Summary      Patient ID: Caleb Woodward MRN: [id number] DOB/AGE: 05-Jun-1946 76 y.o.  Admit date: 11/20/2021 Discharge date: 11/23/2021  Admission Diagnoses:  Principal Problem:   S/P total knee arthroplasty, left Active Problems:   Arthritis of left knee   Discharge Diagnoses:  Same  Surgeries: Procedure(s): LEFT TOTAL KNEE ARTHROPLASTY on 11/20/2021   Consultants:   Discharged Condition: Stable  Hospital Course: Caleb Woodward is an 76 y.o. male who was admitted 11/20/2021 with a chief complaint of left knee pain, and found to have a diagnosis of left knee osteoarthritis.  They were brought to the operating room on 11/20/2021 and underwent the above named procedures.  Pt awoke from anesthesia without complication and was transferred to the floor. On POD1, patient was doing well with controlled pain and no other significant complaints.  He continued working and progressing with physical therapy over the coming days to the point where he was ready for discharge home on 11/23/2021.  Pt will f/u with Dr. Marlou Sa in clinic in ~2 weeks.   Antibiotics given:  Anti-infectives (From admission, onward)    Start     Dose/Rate Route Frequency Ordered Stop   11/20/21 2030  ceFAZolin (ANCEF) IVPB 2g/100 mL premix        2 g 200 mL/hr over 30 Minutes Intravenous Every 6 hours 11/20/21 1941 11/21/21 0201   11/20/21 1431  vancomycin (VANCOCIN) powder  Status:  Discontinued          As needed 11/20/21 1431 11/20/21 1522   11/20/21 1310  vancomycin (VANCOCIN) powder  Status:  Discontinued          As needed 11/20/21 1311 11/20/21 1522   11/20/21 1030  ceFAZolin (ANCEF) IVPB 2g/100 mL premix        2 g 200 mL/hr over 30 Minutes Intravenous On call to O.R. 11/20/21 1020 11/20/21 1241   11/20/21 1027  ceFAZolin (ANCEF) 2-4 GM/100ML-% IVPB       Note to Pharmacy: Odelia Gage E: cabinet override      11/20/21 1027 11/20/21 1249     .  Recent vital signs:  Vitals:   11/23/21  0558 11/23/21 1136  BP: (!) 144/78 (!) 155/87  Pulse: 92 100  Resp: 14 18  Temp: 98.5 F (36.9 C) 98.6 F (37 C)  SpO2: 95% 94%    Recent laboratory studies:  Results for orders placed or performed during the hospital encounter of 11/20/21  CBC  Result Value Ref Range   WBC 10.3 4.0 - 10.5 K/uL   RBC 4.20 (L) 4.22 - 5.81 MIL/uL   Hemoglobin 11.5 (L) 13.0 - 17.0 g/dL   HCT 35.0 (L) 39.0 - 52.0 %   MCV 83.3 80.0 - 100.0 fL   MCH 27.4 26.0 - 34.0 pg   MCHC 32.9 30.0 - 36.0 g/dL   RDW 13.2 11.5 - 15.5 %   Platelets 302 150 - 400 K/uL   nRBC 0.0 0.0 - 0.2 %  Basic metabolic panel  Result Value Ref Range   Sodium 137 135 - 145 mmol/L   Potassium 4.1 3.5 - 5.1 mmol/L   Chloride 104 98 - 111 mmol/L   CO2 23 22 - 32 mmol/L   Glucose, Bld 202 (H) 70 - 99 mg/dL   BUN 13 8 - 23 mg/dL   Creatinine, Ser 1.18 0.61 - 1.24 mg/dL   Calcium 8.8 (L) 8.9 - 10.3 mg/dL   GFR, Estimated >60 >60 mL/min   Anion gap  10 5 - 15  CBC  Result Value Ref Range   WBC 12.9 (H) 4.0 - 10.5 K/uL   RBC 4.48 4.22 - 5.81 MIL/uL   Hemoglobin 12.4 (L) 13.0 - 17.0 g/dL   HCT 37.2 (L) 39.0 - 52.0 %   MCV 83.0 80.0 - 100.0 fL   MCH 27.7 26.0 - 34.0 pg   MCHC 33.3 30.0 - 36.0 g/dL   RDW 13.4 11.5 - 15.5 %   Platelets 280 150 - 400 K/uL   nRBC 0.0 0.0 - 0.2 %    Discharge Medications:   Allergies as of 11/23/2021       Reactions   Cymbalta [duloxetine Hcl] Nausea And Vomiting   Meloxicam Nausea And Vomiting        Medication List     STOP taking these medications    gabapentin 600 MG tablet Commonly known as: Neurontin   traMADol 50 MG tablet Commonly known as: ULTRAM       TAKE these medications    aspirin 81 MG chewable tablet Chew 1 tablet (81 mg total) by mouth 2 (two) times daily.   b complex vitamins tablet Take 1 tablet by mouth daily.   Cholecalciferol 125 MCG (5000 UT) capsule Take 5,000 Units by mouth daily.   citalopram 10 MG tablet Commonly known as: CELEXA Take 10  mg by mouth daily.   diclofenac Sodium 1 % Gel Commonly known as: Voltaren Apply 2 g topically 4 (four) times daily. What changed:  when to take this reasons to take this   docusate sodium 100 MG capsule Commonly known as: COLACE Take 1 capsule (100 mg total) by mouth 2 (two) times daily.   ezetimibe 10 MG tablet Commonly known as: ZETIA Take 10 mg by mouth daily.   Magnesium 400 MG Tabs Take 400 mg by mouth daily.   multivitamin with minerals Tabs tablet Take 1 tablet by mouth daily.   rosuvastatin 5 MG tablet Commonly known as: CRESTOR Take 5 mg by mouth daily.        Diagnostic Studies: XR Knee 1-2 Views Left  Result Date: 12/04/2021 AP lateral radiographs left knee reviewed.  Total knee prosthesis in good position alignment with no complicating features.  Patella height normal relative to distal femur.  No fracture.   Disposition: Discharge disposition: 01-Home or Self Care       Discharge Instructions     Call MD / Call 911   Complete by: As directed    If you experience chest pain or shortness of breath, CALL 911 and be transported to the hospital emergency room.  If you develope a fever above 101 F, pus (white drainage) or increased drainage or redness at the wound, or calf pain, call your surgeon's office.   Constipation Prevention   Complete by: As directed    Drink plenty of fluids.  Prune juice may be helpful.  You may use a stool softener, such as Colace (over the counter) 100 mg twice a day.  Use MiraLax (over the counter) for constipation as needed.   Diet - low sodium heart healthy   Complete by: As directed    Discharge instructions   Complete by: As directed    CPM machine 1 hour 3 times a day to 1-1/2 hours 3 times a day increasing the degrees daily Use blue cradle boot or pillows underneath the heel to facilitate the leg coming all the way straight Okay to shower.  Dressing is waterproof   Increase  activity slowly as tolerated   Complete by:  As directed    Post-operative opioid taper instructions:   Complete by: As directed    POST-OPERATIVE OPIOID TAPER INSTRUCTIONS: It is important to wean off of your opioid medication as soon as possible. If you do not need pain medication after your surgery it is ok to stop day one. Opioids include: Codeine, Hydrocodone(Norco, Vicodin), Oxycodone(Percocet, oxycontin) and hydromorphone amongst others.  Long term and even short term use of opiods can cause: Increased pain response Dependence Constipation Depression Respiratory depression And more.  Withdrawal symptoms can include Flu like symptoms Nausea, vomiting And more Techniques to manage these symptoms Hydrate well Eat regular healthy meals Stay active Use relaxation techniques(deep breathing, meditating, yoga) Do Not substitute Alcohol to help with tapering If you have been on opioids for less than two weeks and do not have pain than it is ok to stop all together.  Plan to wean off of opioids This plan should start within one week post op of your joint replacement. Maintain the same interval or time between taking each dose and first decrease the dose.  Cut the total daily intake of opioids by one tablet each day Next start to increase the time between doses. The last dose that should be eliminated is the evening dose.           Follow-up Information     [facility] Follow up.   Specialty: [place] Why: home health services will be provided by [facility], start of care within 48  hours post discharge Contact information: [address] [place] [address] [phone]         Meredith Pel, MD. Go on 12/04/2021.   Specialty: Orthopedic Surgery Why: at 10:45 am for your first post op appointment with Dr. Marlou Sa in our office. Contact information: [address] [place] [address] [phone]                  Signed: Donella Stade 12/11/2021, 9:33 AM

## 2021-12-12 DIAGNOSIS — M25562 Pain in left knee: Secondary | ICD-10-CM | POA: Diagnosis not present

## 2021-12-12 DIAGNOSIS — M6281 Muscle weakness (generalized): Secondary | ICD-10-CM | POA: Diagnosis not present

## 2021-12-15 DIAGNOSIS — M25562 Pain in left knee: Secondary | ICD-10-CM | POA: Diagnosis not present

## 2021-12-15 DIAGNOSIS — M6281 Muscle weakness (generalized): Secondary | ICD-10-CM | POA: Diagnosis not present

## 2021-12-17 DIAGNOSIS — M6281 Muscle weakness (generalized): Secondary | ICD-10-CM | POA: Diagnosis not present

## 2021-12-17 DIAGNOSIS — M25562 Pain in left knee: Secondary | ICD-10-CM | POA: Diagnosis not present

## 2021-12-18 ENCOUNTER — Other Ambulatory Visit: Payer: Self-pay

## 2021-12-18 ENCOUNTER — Ambulatory Visit (INDEPENDENT_AMBULATORY_CARE_PROVIDER_SITE_OTHER): Payer: Medicare Other | Admitting: Orthopedic Surgery

## 2021-12-18 DIAGNOSIS — Z96652 Presence of left artificial knee joint: Secondary | ICD-10-CM

## 2021-12-19 ENCOUNTER — Telehealth: Payer: Self-pay | Admitting: Orthopedic Surgery

## 2021-12-19 ENCOUNTER — Encounter: Payer: Self-pay | Admitting: Orthopedic Surgery

## 2021-12-19 DIAGNOSIS — M6281 Muscle weakness (generalized): Secondary | ICD-10-CM | POA: Diagnosis not present

## 2021-12-19 DIAGNOSIS — M25562 Pain in left knee: Secondary | ICD-10-CM | POA: Diagnosis not present

## 2021-12-19 MED ORDER — HYDROCODONE-ACETAMINOPHEN 5-325 MG PO TABS
1.0000 | ORAL_TABLET | Freq: Two times a day (BID) | ORAL | 0 refills | Status: DC
Start: 2021-12-19 — End: 2021-12-22

## 2021-12-19 MED ORDER — METHOCARBAMOL 500 MG PO TABS: 500.0000 mg | ORAL_TABLET | Freq: Three times a day (TID) | ORAL | 0 refills | Status: DC | PRN

## 2021-12-19 NOTE — Telephone Encounter (Signed)
Medication sent in by Dr .Marlou Sa this morning.

## 2021-12-19 NOTE — Telephone Encounter (Signed)
Nothing really to do.  Cannot add any other sleep medication because he has been on the clonazepam for so long.  He may go through some withdrawal as the pain medicine strength is diminished but I feel like we have to do that so that he does not keep losing weight.  Have her call this weekend since we are on call if there is an issue.
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
## 2021-12-19 NOTE — Telephone Encounter (Signed)
Patient's wife Hilda Blades called advised patient need Rx refilled Hydrocodone 10-325 and Methocarbamol 500 mg. Patient uses the [facility] in [place].  Per Hilda Blades patient's pain level is high.  The number to contact Hilda Blades is [phone]

## 2021-12-19 NOTE — Progress Notes (Signed)
Post-Op Visit Note   Patient: Caleb Woodward           Date of Birth: 01-12-1946           MRN: [id number] Visit Date: 12/18/2021 PCP: Maryella Shivers, MD   Assessment & Plan:  Chief Complaint:  Chief Complaint  Patient presents with   Left Knee - Routine Post Op   Visit Diagnoses:  1. Status post total left knee replacement     Plan: Patient presents now about 1 month out left total knee replacement.  There is a little bit of continuing weight loss going on.  Weight 187 today.  Concerned about potentially doing too much in physical therapy.  Oxycodone was not helpful.  Norco 08/04/24 was more helpful and he has been taking 2/day.  Also using Robaxin.  In therapy 3 times a week.  Sometimes the therapy has aggravated the knee.  CPM machine now complete but patient did get up to 115 degrees.  On examination the knee does not have too much swelling.  No calf tenderness negative Homans.  Range of motion is about 8-1 15.  Quad strength is improving.  Plan at this time is to start tapering narcotic medication as I think that is the likely culprit behind his weight loss.  Refill Robaxin 500 mg 3 times daily #35.  Norco 03/04/2024 1 p.o. twice daily for 7 days and daily for 7 days.  Therapy note also written which details extensively less quad strengthening exercises and more focus on extension and passive range of motion types of things as well as some active range of motion type things.  Come back in 2 weeks for clinical recheck.  Follow-Up Instructions: Return in about 2 weeks (around 01/01/2022).   Orders:  No orders of the defined types were placed in this encounter.  No orders of the defined types were placed in this encounter.   Imaging: No results found.  PMFS History: Patient Active Problem List   Diagnosis Date Noted   Arthritis of left knee    S/P total knee arthroplasty, left 11/20/2021   Abnormality of gait 01/21/2021   Tendonitis 08/29/2020   Plantar fasciitis 08/29/2020    Neuropathic pain 08/29/2020   Myalgia 02/29/2020   Spondylosis without myelopathy or radiculopathy, lumbar region 12/26/2019   Facet arthropathy 11/28/2019   Failed back syndrome 09/05/2019   Slow heart rate 08/30/2018   Chronic pain syndrome 07/27/2018   Obstructive sleep apnea 09/01/2017   Insomnia 09/01/2017   RBD (REM behavioral disorder) 09/01/2017   Lumbar stenosis 09/15/2016   Cervical stenosis of spinal canal 02/06/2014   Past Medical History:  Diagnosis Date   Cancer ([place]) 2012   Prostate cancer   Elevated cholesterol    GERD (gastroesophageal reflux disease)    H/O hiatal hernia    Headache(784.0)    due to accident in the past   Neuromuscular disorder ([place])    numbness and tingling   Sleep apnea     Family History  Problem Relation Age of Onset   Diabetes Mother     Past Surgical History:  Procedure Laterality Date   ANTERIOR CERVICAL DECOMP/DISCECTOMY FUSION N/A 02/06/2014   Procedure: CERVICAL FIVE TO CERVICAL SIX, CERVICAL SIX TO CERVICAL SEVEN  ANTERIOR CERVICAL DECOMPRESSION/DISCECTOMY FUSION 2 LEVELS;  Surgeon: Floyce Stakes, MD;  Location: [facility];  Service: Neurosurgery;  Laterality: N/A;  C5-6 C6-7 Anterior cervical decompression/diskectomy/fusion   COLONOSCOPY W/ BIOPSIES AND POLYPECTOMY     LUMBAR LAMINECTOMY/DECOMPRESSION  MICRODISCECTOMY Left 09/15/2016   Procedure: LEFT LUMBAR FOUR-FIVE, LUMBAR FIVE-SACRAL ONE MICRODISCECTOMY;  Surgeon: Leeroy Cha, MD;  Location: [facility];  Service: Neurosurgery;  Laterality: Left;  LEFT L4-5 L5-S1 MICRODISCECTOMY   PROSTATE SURGERY  11/27/2010   TOTAL KNEE ARTHROPLASTY Left 11/20/2021   Procedure: LEFT TOTAL KNEE ARTHROPLASTY;  Surgeon: Meredith Pel, MD;  Location: [facility];  Service: Orthopedics;  Laterality: Left;   ULNAR NERVE REPAIR Left 2014   Social History   Occupational History   Not on file  Tobacco Use   Smoking status: Never   Smokeless tobacco: Never  Vaping Use   Vaping Use: Never  used  Substance and Sexual Activity   Alcohol use: Yes    Comment: socially but rarely   Drug use: No   Sexual activity: Not on file

## 2021-12-22 ENCOUNTER — Encounter: Payer: Self-pay | Admitting: Internal Medicine

## 2021-12-22 ENCOUNTER — Encounter: Payer: Self-pay | Admitting: Orthopedic Surgery

## 2021-12-22 ENCOUNTER — Ambulatory Visit (INDEPENDENT_AMBULATORY_CARE_PROVIDER_SITE_OTHER): Payer: Medicare Other | Admitting: Internal Medicine

## 2021-12-22 ENCOUNTER — Other Ambulatory Visit: Payer: Self-pay

## 2021-12-22 DIAGNOSIS — F5101 Primary insomnia: Secondary | ICD-10-CM | POA: Diagnosis not present

## 2021-12-22 DIAGNOSIS — G4733 Obstructive sleep apnea (adult) (pediatric): Secondary | ICD-10-CM | POA: Diagnosis not present

## 2021-12-22 DIAGNOSIS — M6281 Muscle weakness (generalized): Secondary | ICD-10-CM | POA: Diagnosis not present

## 2021-12-22 DIAGNOSIS — G4752 REM sleep behavior disorder: Secondary | ICD-10-CM

## 2021-12-22 DIAGNOSIS — M25562 Pain in left knee: Secondary | ICD-10-CM | POA: Diagnosis not present

## 2021-12-22 MED ORDER — CARBIDOPA-LEVODOPA ER 50-200 MG PO TBCR: EXTENDED_RELEASE_TABLET | ORAL | 3 refills | Status: DC

## 2021-12-22 MED ORDER — ESZOPICLONE 3 MG PO TABS: ORAL_TABLET | ORAL | 3 refills | Status: DC

## 2021-12-22 NOTE — Assessment & Plan Note (Addendum)
Clonazepam 1.5 mg has not been helping insomnia or RBD well enough.  We will try combination therapy.  Discussed weaning off of clonazepam. Plan-carbidopa-levodopa

## 2021-12-22 NOTE — Assessment & Plan Note (Signed)
He is using CPAP "some".  We discussed medical importance and compliance goals.  He decided he would not be able to sleep with an oral appliance in his mouth. Plan-continue CPAP auto 5-20

## 2021-12-22 NOTE — Patient Instructions (Signed)
Script sent to try Lunesta 3 mg, 1 at bedtime for sleep  Script sent to try carbidopa-levodopa for REM Behavior Disorder  Drop the clonazepam 1 tab (0.5 mg) x 1 week, then stop  Please call as needed

## 2021-12-22 NOTE — Assessment & Plan Note (Signed)
Clonazepam 1.5 mg was insufficient and he wants to try something different.  Consider trazodone but it interacts with his antidepressant. Plan-Lunesta.

## 2021-12-24 DIAGNOSIS — M25562 Pain in left knee: Secondary | ICD-10-CM | POA: Diagnosis not present

## 2021-12-24 DIAGNOSIS — M6281 Muscle weakness (generalized): Secondary | ICD-10-CM | POA: Diagnosis not present

## 2021-12-26 ENCOUNTER — Encounter: Payer: Self-pay | Admitting: Orthopedic Surgery

## 2021-12-26 DIAGNOSIS — M25562 Pain in left knee: Secondary | ICD-10-CM | POA: Diagnosis not present

## 2021-12-26 DIAGNOSIS — M6281 Muscle weakness (generalized): Secondary | ICD-10-CM | POA: Diagnosis not present

## 2021-12-27 ENCOUNTER — Encounter: Payer: Self-pay | Admitting: Internal Medicine

## 2021-12-27 DIAGNOSIS — G4752 REM sleep behavior disorder: Secondary | ICD-10-CM

## 2021-12-29 DIAGNOSIS — M6281 Muscle weakness (generalized): Secondary | ICD-10-CM | POA: Diagnosis not present

## 2021-12-29 DIAGNOSIS — M25562 Pain in left knee: Secondary | ICD-10-CM | POA: Diagnosis not present

## 2021-12-29 NOTE — Telephone Encounter (Signed)
Please advise 

## 2021-12-29 NOTE — Telephone Encounter (Signed)
DC Sinemet (carbidopa-levodopa) D/C Lunesta  If he has enough clonazepam left to try for a few nights, he can try up to 3 Mg ( 6 of his 0.5 mg tablets) per night.

## 2021-12-31 ENCOUNTER — Encounter: Payer: Self-pay | Admitting: Orthopedic Surgery

## 2021-12-31 ENCOUNTER — Other Ambulatory Visit: Payer: Self-pay

## 2021-12-31 ENCOUNTER — Ambulatory Visit (INDEPENDENT_AMBULATORY_CARE_PROVIDER_SITE_OTHER): Payer: Medicare Other | Admitting: Orthopedic Surgery

## 2021-12-31 DIAGNOSIS — E785 Hyperlipidemia, unspecified: Secondary | ICD-10-CM | POA: Diagnosis not present

## 2021-12-31 DIAGNOSIS — Z96652 Presence of left artificial knee joint: Secondary | ICD-10-CM

## 2021-12-31 DIAGNOSIS — M6281 Muscle weakness (generalized): Secondary | ICD-10-CM | POA: Diagnosis not present

## 2021-12-31 DIAGNOSIS — M25562 Pain in left knee: Secondary | ICD-10-CM | POA: Diagnosis not present

## 2021-12-31 DIAGNOSIS — R7303 Prediabetes: Secondary | ICD-10-CM | POA: Diagnosis not present

## 2021-12-31 DIAGNOSIS — I10 Essential (primary) hypertension: Secondary | ICD-10-CM | POA: Diagnosis not present

## 2021-12-31 MED ORDER — HYDROCODONE-ACETAMINOPHEN 10-325 MG PO TABS: 1.0000 | ORAL_TABLET | Freq: Two times a day (BID) | ORAL | 0 refills | Status: DC

## 2021-12-31 NOTE — Progress Notes (Signed)
? ?Post-Op Visit Note ?  ?Patient: Caleb Woodward           ?Date of Birth: 07/04/1946           ?MRN: [id number] ?Visit Date: 12/31/2021 ?PCP: Caleb Shivers, MD ? ? ?Assessment & Plan: ? ?Chief Complaint:  ?Chief Complaint  ?Patient presents with  ? Left Knee - Routine Post Op  ?  11/20/21 left TKA  ? ?Visit Diagnoses:  ?1. Status post total left knee replacement   ? ? ?Plan: Caleb Woodward is a 76 year old patient who is now about 6 weeks out left total knee replacement patient has made improvements since his last visit.  Still has pain which is worse at night but does not have level 810 out of pain every night.  He is taking Tylenol and Robaxin during the day.  Does take hydrocodone at night but not every night.  Typically gets 5 out of 7 nights.  Has been in physical therapy working on extension.  He has been back at work some but not doing any kneeling or crawling.  He is getting very good going up stairs.  His weight has improved to 191 pounds.  Sleeping remains erratic.  Has gone back on clonazepam. ? ?On examination range of motion is 2-1 20.  Mild effusion present.  No proximal lymphadenopathy.  No calf tenderness or swelling on the left.  Plan at this time is to refill Norco to take at nighttime only but not all nights.  Would like for him to be off all narcotics by 3 months postop.  4-week return for clinical recheck.  Continue with therapy until that time.  He actually has very good quad strength for 6 months out with about 1 cm only of atrophy.  His envelope of function will increase as his quad strength increases.  He is doing well on the recumbent bike.  Follow-up in 4 weeks. ? ?Follow-Up Instructions: Return in about 4 weeks (around 01/28/2022).  ? ?Orders:  ?No orders of the defined types were placed in this encounter. ? ?Meds ordered this encounter  ?Medications  ? HYDROcodone-acetaminophen (NORCO) 10-325 MG tablet  ?  Sig: Take 1 tablet by mouth 2 (two) times daily.  ?  Dispense:  25 tablet  ?  Refill:  0   ? ? ?Imaging: ?No results found. ? ?PMFS History: ?Patient Active Problem List  ? Diagnosis Date Noted  ? Arthritis of left knee   ? S/P total knee arthroplasty, left 11/20/2021  ? Abnormality of gait 01/21/2021  ? Tendonitis 08/29/2020  ? Plantar fasciitis 08/29/2020  ? Neuropathic pain 08/29/2020  ? Myalgia 02/29/2020  ? Spondylosis without myelopathy or radiculopathy, lumbar region 12/26/2019  ? Facet arthropathy 11/28/2019  ? Failed back syndrome 09/05/2019  ? Slow heart rate 08/30/2018  ? Chronic pain syndrome 07/27/2018  ? Obstructive sleep apnea 09/01/2017  ? Insomnia 09/01/2017  ? RBD (REM behavioral disorder) 09/01/2017  ? Lumbar stenosis 09/15/2016  ? Cervical stenosis of spinal canal 02/06/2014  ? ?Past Medical History:  ?Diagnosis Date  ? Cancer [facility]) 2012  ? Prostate cancer  ? Elevated cholesterol   ? GERD (gastroesophageal reflux disease)   ? H/O hiatal hernia   ? Headache(784.0)   ? due to accident in the past  ? Neuromuscular disorder ([place])   ? numbness and tingling  ? Sleep apnea   ?  ?Family History  ?Problem Relation Age of Onset  ? Diabetes Mother   ?  ?Past Surgical History:  ?  Procedure Laterality Date  ? ANTERIOR CERVICAL DECOMP/DISCECTOMY FUSION N/A 02/06/2014  ? Procedure: CERVICAL FIVE TO CERVICAL SIX, CERVICAL SIX TO CERVICAL SEVEN  ANTERIOR CERVICAL DECOMPRESSION/DISCECTOMY FUSION 2 LEVELS;  Surgeon: Caleb Stakes, MD;  Location: [place] [facility];  Service: Neurosurgery;  Laterality: N/A;  C5-6 C6-7 Anterior cervical decompression/diskectomy/fusion  ? COLONOSCOPY W/ BIOPSIES AND POLYPECTOMY    ? LUMBAR LAMINECTOMY/DECOMPRESSION MICRODISCECTOMY Left 09/15/2016  ? Procedure: LEFT LUMBAR FOUR-FIVE, LUMBAR FIVE-SACRAL ONE MICRODISCECTOMY;  Surgeon: Caleb Cha, MD;  Location: [place];  Service: Neurosurgery;  Laterality: Left;  LEFT L4-5 L5-S1 MICRODISCECTOMY  ? PROSTATE SURGERY  11/27/2010  ? TOTAL KNEE ARTHROPLASTY Left 11/20/2021  ? Procedure: LEFT TOTAL KNEE ARTHROPLASTY;  Surgeon: Caleb Pel, MD;  Location: [place];  Service: Orthopedics;  Laterality: Left;  ? ULNAR NERVE REPAIR Left 2014  ? ?Social History  ? ?Occupational History  ? Not on file  ?Tobacco Use  ? Smoking status: Never  ? Smokeless tobacco: Never  ?Vaping Use  ? Vaping Use: Never used  ?Substance and Sexual Activity  ? Alcohol use: Yes  ?  Comment: socially but rarely  ? Drug use: No  ? Sexual activity: Not on file  ? ? ? ?

## 2022-01-05 DIAGNOSIS — M6281 Muscle weakness (generalized): Secondary | ICD-10-CM | POA: Diagnosis not present

## 2022-01-05 DIAGNOSIS — M25562 Pain in left knee: Secondary | ICD-10-CM | POA: Diagnosis not present

## 2022-01-05 NOTE — Telephone Encounter (Signed)
Please advise on refill. Thanks. 

## 2022-01-07 DIAGNOSIS — M6281 Muscle weakness (generalized): Secondary | ICD-10-CM | POA: Diagnosis not present

## 2022-01-07 DIAGNOSIS — M25562 Pain in left knee: Secondary | ICD-10-CM | POA: Diagnosis not present

## 2022-01-13 DIAGNOSIS — M6281 Muscle weakness (generalized): Secondary | ICD-10-CM | POA: Diagnosis not present

## 2022-01-13 DIAGNOSIS — M25562 Pain in left knee: Secondary | ICD-10-CM | POA: Diagnosis not present

## 2022-01-15 DIAGNOSIS — M25562 Pain in left knee: Secondary | ICD-10-CM | POA: Diagnosis not present

## 2022-01-15 DIAGNOSIS — M6281 Muscle weakness (generalized): Secondary | ICD-10-CM | POA: Diagnosis not present

## 2022-01-16 MED ORDER — CLONAZEPAM 0.5 MG PO TABS: ORAL_TABLET | ORAL | 5 refills | Status: DC

## 2022-01-16 NOTE — Telephone Encounter (Signed)
Revised clonazepam script sent to drug store ?

## 2022-01-20 ENCOUNTER — Telehealth: Payer: Self-pay

## 2022-01-20 NOTE — Telephone Encounter (Signed)
Patient called and would like a repeat injection with Dr. Ernestina Patches.  ? ?Please advise  ?

## 2022-01-20 NOTE — Telephone Encounter (Signed)
I called and spoke with patients with who states that he did get relief from the injection however a couple of weeks ago he started having left foot and leg burning, he also had a total knee replacement with Dr. Marlou Sa about 8 weeks ago however he has since returned back to work. She states that she is not sure if they are related or not. Please advise ?

## 2022-01-21 ENCOUNTER — Other Ambulatory Visit: Payer: Self-pay | Admitting: Physical Medicine and Rehabilitation

## 2022-01-21 DIAGNOSIS — M5416 Radiculopathy, lumbar region: Secondary | ICD-10-CM

## 2022-01-22 DIAGNOSIS — M6281 Muscle weakness (generalized): Secondary | ICD-10-CM | POA: Diagnosis not present

## 2022-01-22 DIAGNOSIS — M25562 Pain in left knee: Secondary | ICD-10-CM | POA: Diagnosis not present

## 2022-01-27 DIAGNOSIS — M6281 Muscle weakness (generalized): Secondary | ICD-10-CM | POA: Diagnosis not present

## 2022-01-27 DIAGNOSIS — M25562 Pain in left knee: Secondary | ICD-10-CM | POA: Diagnosis not present

## 2022-01-29 DIAGNOSIS — M6281 Muscle weakness (generalized): Secondary | ICD-10-CM | POA: Diagnosis not present

## 2022-01-29 DIAGNOSIS — M25562 Pain in left knee: Secondary | ICD-10-CM | POA: Diagnosis not present

## 2022-01-31 DIAGNOSIS — G8929 Other chronic pain: Secondary | ICD-10-CM | POA: Diagnosis not present

## 2022-01-31 DIAGNOSIS — E785 Hyperlipidemia, unspecified: Secondary | ICD-10-CM | POA: Diagnosis not present

## 2022-02-04 ENCOUNTER — Ambulatory Visit (INDEPENDENT_AMBULATORY_CARE_PROVIDER_SITE_OTHER): Payer: Medicare Other | Admitting: Orthopedic Surgery

## 2022-02-04 ENCOUNTER — Encounter: Payer: Self-pay | Admitting: Orthopedic Surgery

## 2022-02-04 DIAGNOSIS — Z96652 Presence of left artificial knee joint: Secondary | ICD-10-CM

## 2022-02-07 ENCOUNTER — Encounter: Payer: Self-pay | Admitting: Orthopedic Surgery

## 2022-02-07 NOTE — Progress Notes (Signed)
? ?Post-Op Visit Note ?  ?Patient: Caleb Woodward           ?Date of Birth: 02-18-1946           ?MRN: [id number] ?Visit Date: 02/04/2022 ?PCP: Caleb Shivers, MD ? ? ?Assessment & Plan: ? ?Chief Complaint:  ?Chief Complaint  ?Patient presents with  ? Left Knee - Routine Post Op  ?  11/20/21 left TKA  ? ?Visit Diagnoses:  ?1. Status post total left knee replacement   ? ? ?Plan: Caleb Woodward is a 76 year old patient underwent left total knee replacement several months ago.  He has been doing well.  He has returned to some work.  His weight has stabilized.  He is very functional with the left knee going up stairs and is becoming more functional going downstairs.  On examination he has trace effusion with excellent range of motion from extension to about 115 of flexion.  Collaterals are stable to varus and valgus stress at 0 30 and 90 degrees.  Leg muscle tone is excellent in the left leg with less than a centimeter side to side atrophy left versus right.  Overall Caleb Woodward has made excellent progress from his initial postop visit.  Need for dental prophylaxis prior to any procedures is discussed.  He will follow-up with [place] as needed. ? ?Follow-Up Instructions: Return if symptoms worsen or fail to improve.  ? ?Orders:  ?No orders of the defined types were placed in this encounter. ? ?No orders of the defined types were placed in this encounter. ? ? ?Imaging: ?No results found. ? ?PMFS History: ?Patient Active Problem List  ? Diagnosis Date Noted  ? Arthritis of left knee   ? S/P total knee arthroplasty, left 11/20/2021  ? Abnormality of gait 01/21/2021  ? Tendonitis 08/29/2020  ? Plantar fasciitis 08/29/2020  ? Neuropathic pain 08/29/2020  ? Myalgia 02/29/2020  ? Spondylosis without myelopathy or radiculopathy, lumbar region 12/26/2019  ? Facet arthropathy 11/28/2019  ? Failed back syndrome 09/05/2019  ? Slow heart rate 08/30/2018  ? Chronic pain syndrome 07/27/2018  ? Obstructive sleep apnea 09/01/2017  ? Insomnia 09/01/2017  ?  RBD (REM behavioral disorder) 09/01/2017  ? Lumbar stenosis 09/15/2016  ? Cervical stenosis of spinal canal 02/06/2014  ? ?Past Medical History:  ?Diagnosis Date  ? Cancer [facility]) 2012  ? Prostate cancer  ? Elevated cholesterol   ? GERD (gastroesophageal reflux disease)   ? H/O hiatal hernia   ? Headache(784.0)   ? due to accident in the past  ? Neuromuscular disorder ([place])   ? numbness and tingling  ? Sleep apnea   ?  ?Family History  ?Problem Relation Age of Onset  ? Diabetes Mother   ?  ?Past Surgical History:  ?Procedure Laterality Date  ? ANTERIOR CERVICAL DECOMP/DISCECTOMY FUSION N/A 02/06/2014  ? Procedure: CERVICAL FIVE TO CERVICAL SIX, CERVICAL SIX TO CERVICAL SEVEN  ANTERIOR CERVICAL DECOMPRESSION/DISCECTOMY FUSION 2 LEVELS;  Surgeon: Floyce Stakes, MD;  Location: [place] [facility];  Service: Neurosurgery;  Laterality: N/A;  C5-6 C6-7 Anterior cervical decompression/diskectomy/fusion  ? COLONOSCOPY W/ BIOPSIES AND POLYPECTOMY    ? LUMBAR LAMINECTOMY/DECOMPRESSION MICRODISCECTOMY Left 09/15/2016  ? Procedure: LEFT LUMBAR FOUR-FIVE, LUMBAR FIVE-SACRAL ONE MICRODISCECTOMY;  Surgeon: Leeroy Cha, MD;  Location: [place];  Service: Neurosurgery;  Laterality: Left;  LEFT L4-5 L5-S1 MICRODISCECTOMY  ? PROSTATE SURGERY  11/27/2010  ? TOTAL KNEE ARTHROPLASTY Left 11/20/2021  ? Procedure: LEFT TOTAL KNEE ARTHROPLASTY;  Surgeon: Meredith Pel, MD;  Location: [place];  Service: Orthopedics;  Laterality: Left;  ? ULNAR NERVE REPAIR Left 2014  ? ?Social History  ? ?Occupational History  ? Not on file  ?Tobacco Use  ? Smoking status: Never  ? Smokeless tobacco: Never  ?Vaping Use  ? Vaping Use: Never used  ?Substance and Sexual Activity  ? Alcohol use: Yes  ?  Comment: socially but rarely  ? Drug use: No  ? Sexual activity: Not on file  ? ? ? ?

## 2022-02-09 MED ORDER — AMOXICILLIN 500 MG PO TABS: ORAL_TABLET | ORAL | 0 refills | Status: DC

## 2022-02-13 DIAGNOSIS — R051 Acute cough: Secondary | ICD-10-CM | POA: Diagnosis not present

## 2022-02-13 DIAGNOSIS — R059 Cough, unspecified: Secondary | ICD-10-CM | POA: Diagnosis not present

## 2022-02-13 DIAGNOSIS — Z20822 Contact with and (suspected) exposure to covid-19: Secondary | ICD-10-CM | POA: Diagnosis not present

## 2022-02-17 ENCOUNTER — Ambulatory Visit (INDEPENDENT_AMBULATORY_CARE_PROVIDER_SITE_OTHER): Payer: Medicare Other | Admitting: Physical Medicine and Rehabilitation

## 2022-02-17 ENCOUNTER — Ambulatory Visit: Payer: Self-pay

## 2022-02-17 ENCOUNTER — Encounter: Payer: Self-pay | Admitting: Physical Medicine and Rehabilitation

## 2022-02-17 DIAGNOSIS — M5416 Radiculopathy, lumbar region: Secondary | ICD-10-CM | POA: Diagnosis not present

## 2022-02-17 MED ORDER — METHYLPREDNISOLONE ACETATE 80 MG/ML IJ SUSP
80.0000 mg | Freq: Once | INTRAMUSCULAR | Status: AC
  Administered 2022-02-17: 80 mg

## 2022-02-17 NOTE — Progress Notes (Signed)
Pt state lower back pain that travels down his left leg. Pt state laying down makes the pain worst and he feet feels like it on fire. Pt state he takes pain meds and uses pain cream to help ease his pain. ? ? ?Numeric Pain Rating Scale and Functional Assessment ?Average Pain 4 ? ? ?In the last MONTH (on 0-10 scale) has pain interfered with the following? ? ?1. General activity like being  able to carry out your everyday physical activities such as walking, climbing stairs, carrying groceries, or moving a chair?  ?Rating(9) ? ? ?+Driver, -BT, -Dye Allergies. ? ? ?

## 2022-02-17 NOTE — Patient Instructions (Signed)

## 2022-02-25 NOTE — Progress Notes (Signed)
? ?Orange Hilligoss - 76 y.o. male MRN [id number]  Date of birth: Aug 22, 1946 ? ?Office Visit Note: ?Visit Date: 02/17/2022 ?PCP: Maryella Shivers, MD ?Referred by: Maryella Shivers, MD ? ?Subjective: ?Chief Complaint  ?Patient presents with  ? Lower Back - Pain  ? Left Leg - Pain  ? Left Foot - Pain  ? ?HPI:  Aquilla Cervone is a 76 y.o. male who comes in today for planned repeat Left L4-5  Lumbar Interlaminar epidural steroid injection with fluoroscopic guidance.  The patient has failed conservative care including home exercise, medications, time and activity modification.  This injection will be diagnostic and hopefully therapeutic.  Please see requesting physician notes for further details and justification. Patient received more than 50% pain relief from prior injection.  ? ?Referring: Dr. Anderson Malta ? ?ROS Otherwise per HPI. ? ?Assessment & Plan: ?Visit Diagnoses:  ?  ICD-10-CM   ?1. Lumbar radiculopathy  M54.16 XR C-ARM NO REPORT  ?  Epidural Steroid injection  ?  methylPREDNISolone acetate (DEPO-MEDROL) injection 80 mg  ?  ?  ?Plan: No additional findings.  ? ?Meds & Orders:  ?Meds ordered this encounter  ?Medications  ? methylPREDNISolone acetate (DEPO-MEDROL) injection 80 mg  ?  ?Orders Placed This Encounter  ?Procedures  ? XR C-ARM NO REPORT  ? Epidural Steroid injection  ?  ?Follow-up: Return if symptoms worsen or fail to improve.  ? ?Procedures: ?No procedures performed  ?Lumbar Epidural Steroid Injection - Interlaminar Approach with Fluoroscopic Guidance ? ?Patient: Dal Blew      ?Date of Birth: May 23, 1946 ?MRN: [id number] ?PCP: Maryella Shivers, MD      ?Visit Date: 02/17/2022 ?  ?Universal Protocol:    ? ?Consent Given By: the patient ? ?Position: PRONE ? ?Additional Comments: ?Vital signs were monitored before and after the procedure. ?Patient was prepped and draped in the usual sterile fashion. ?The correct patient, procedure, and site was verified. ? ? ?Injection Procedure Details:  ? ?Procedure  diagnoses: Lumbar radiculopathy [M54.16]  ? ?Meds Administered:  ?Meds ordered this encounter  ?Medications  ? methylPREDNISolone acetate (DEPO-MEDROL) injection 80 mg  ?  ? ?Laterality: Left ? ?Location/Site:  L4-5 ? ?Needle: 3.5 in., 20 ga. Tuohy ? ?Needle Placement: Paramedian epidural ? ?Findings:  ? -Comments: Excellent flow of contrast into the epidural space. ? ?Procedure Details: ?Using a paramedian approach from the side mentioned above, the region overlying the inferior lamina was localized under fluoroscopic visualization and the soft tissues overlying this structure were infiltrated with 4 ml. of 1% Lidocaine without Epinephrine. The Tuohy needle was inserted into the epidural space using a paramedian approach.  ? ?The epidural space was localized using loss of resistance along with counter oblique bi-planar fluoroscopic views.  After negative aspirate for air, blood, and CSF, a 2 ml. volume of Isovue-250 was injected into the epidural space and the flow of contrast was observed. Radiographs were obtained for documentation purposes.   ? ?The injectate was administered into the level noted above. ? ? ?Additional Comments:  ?The patient tolerated the procedure well ?Dressing: 2 x 2 sterile gauze and Band-Aid ?  ? ?Post-procedure details: ?Patient was observed during the procedure. ?Post-procedure instructions were reviewed. ? ?Patient left the clinic in stable condition.  ? ?Clinical History: ?MRI LUMBAR SPINE WITHOUT AND WITH CONTRAST  ?   ?TECHNIQUE:  ?Multiplanar and multiecho pulse sequences of the lumbar spine were  ?obtained without and with intravenous contrast.  ?   ?CONTRAST:  108m GADAVIST GADOBUTROL 1 MMOL/ML  IV SOLN  ?   ?COMPARISON:  02/01/2018  ?   ?FINDINGS:  ?Segmentation:  Standard lumbar numbering  ?   ?Alignment:  Physiologic  ?   ?Vertebrae:  No fracture, evidence of discitis, or bone lesion.  ?   ?Conus medullaris and cauda equina: Conus extends to the T12-L1  ?level. Conus and cauda  equina appear normal.  ?   ?Paraspinal and other soft tissues: Postoperative scarring to a mild  ?degree in the low left lumbar spine.  ?   ?Disc levels:  ?   ?T12- L1: Ventral disc bulging.  ?   ?L1-L2: Mild disc narrowing and bulging  ?   ?L2-L3: Disc narrowing and bulging with posterior annular fissure and  ?tiny right paracentral protrusion. Encroachment on the right L3  ?nerve root at the subarticular recess which is progressed.  ?   ?L3-L4: Disc narrowing and bulging. Facet spurring and ligamentum  ?flavum thickening. High-grade spinal stenosis with cauda equina  ?crowding, unchanged. Patent foramina  ?   ?L4-L5: Disc narrowing and bulging with a newright eccentric  ?herniation which compresses the thecal sac to an advanced degree,  ?especially on the right where there is asymmetric facet spurring and  ?ligamentum flavum thickening post left laminotomy and partial  ?facetectomy. There is an inferiorly migrating extrusion component to  ?the mid L5 body level which impinges severely on the right L5 nerve  ?root.  ?   ?L5-S1:Disc collapse and endplate degeneration with endplate ridging.  ?Degenerative facet spurring.  ?   ?IMPRESSION:  ?1. Symptomatic finding is likely at L4-5 where there is a new  ?herniation causing high-grade spinal stenosis. An inferiorly and  ?rightward migrating extrusion component tracks along the right L5  ?nerve root which is severely compressed.  ?2. L3-4 chronic moderate to advanced spinal stenosis due to  ?degenerative disease and short pedicles.  ?3. L2-3 right subarticular recess narrowing crowding the right L3  ?nerve root.  ?   ?   ?Electronically Signed  ?  By: Monte Fantasia M.D.  ?  On: 01/27/2021 08:09  ? ? ? ?Objective:  VS:  HT:    WT:   BMI:     BP:   HR: bpm  TEMP: ( )  RESP:  ?Physical Exam ?Vitals and nursing note reviewed.  ?Constitutional:   ?   General: He is not in acute distress. ?   Appearance: Normal appearance. He is not ill-appearing.  ?HENT:  ?   Head:  Normocephalic and atraumatic.  ?   Right Ear: External ear normal.  ?   Left Ear: External ear normal.  ?   Nose: No congestion.  ?Eyes:  ?   Extraocular Movements: Extraocular movements intact.  ?Cardiovascular:  ?   Rate and Rhythm: Normal rate.  ?   Pulses: Normal pulses.  ?Pulmonary:  ?   Effort: Pulmonary effort is normal. No respiratory distress.  ?Abdominal:  ?   General: There is no distension.  ?   Palpations: Abdomen is soft.  ?Musculoskeletal:     ?   General: No tenderness or signs of injury.  ?   Cervical back: Neck supple.  ?   Right lower leg: No edema.  ?   Left lower leg: No edema.  ?   Comments: Patient has good distal strength without clonus.  ?Skin: ?   Findings: No erythema or rash.  ?Neurological:  ?   General: No focal deficit present.  ?   Mental Status: He  is alert and oriented to person, place, and time.  ?   Sensory: No sensory deficit.  ?   Motor: No weakness or abnormal muscle tone.  ?   Coordination: Coordination normal.  ?Psychiatric:     ?   Mood and Affect: Mood normal.     ?   Behavior: Behavior normal.  ?  ? ?Imaging: ?No results found. ?

## 2022-02-25 NOTE — Procedures (Signed)
Lumbar Epidural Steroid Injection - Interlaminar Approach with Fluoroscopic Guidance ? ?Patient: Caleb Woodward      ?Date of Birth: [birth year] ?MRN: [id number] ?PCP: Maryella Shivers, MD      ?Visit Date: 02/17/2022 ?  ?Universal Protocol:    ? ?Consent Given By: the patient ? ?Position: PRONE ? ?Additional Comments: ?Vital signs were monitored before and after the procedure. ?Patient was prepped and draped in the usual sterile fashion. ?The correct patient, procedure, and site was verified. ? ? ?Injection Procedure Details:  ? ?Procedure diagnoses: Lumbar radiculopathy [M54.16]  ? ?Meds Administered:  ?Meds ordered this encounter  ?Medications  ? methylPREDNISolone acetate (DEPO-MEDROL) injection 80 mg  ?  ? ?Laterality: Left ? ?Location/Site:  L4-5 ? ?Needle: 3.5 in., 20 ga. Tuohy ? ?Needle Placement: Paramedian epidural ? ?Findings:  ? -Comments: Excellent flow of contrast into the epidural space. ? ?Procedure Details: ?Using a paramedian approach from the side mentioned above, the region overlying the inferior lamina was localized under fluoroscopic visualization and the soft tissues overlying this structure were infiltrated with 4 ml. of 1% Lidocaine without Epinephrine. The Tuohy needle was inserted into the epidural space using a paramedian approach.  ? ?The epidural space was localized using loss of resistance along with counter oblique bi-planar fluoroscopic views.  After negative aspirate for air, blood, and CSF, a 2 ml. volume of Isovue-250 was injected into the epidural space and the flow of contrast was observed. Radiographs were obtained for documentation purposes.   ? ?The injectate was administered into the level noted above. ? ? ?Additional Comments:  ?The patient tolerated the procedure well ?Dressing: 2 x 2 sterile gauze and Band-Aid ?  ? ?Post-procedure details: ?Patient was observed during the procedure. ?Post-procedure instructions were reviewed. ? ?Patient left the clinic in stable condition. ?

## 2022-02-26 ENCOUNTER — Encounter: Payer: Self-pay | Admitting: Physical Medicine and Rehabilitation

## 2022-02-27 ENCOUNTER — Encounter: Payer: Self-pay | Admitting: Physical Medicine and Rehabilitation

## 2022-03-02 ENCOUNTER — Other Ambulatory Visit: Payer: Self-pay | Admitting: Physical Medicine and Rehabilitation

## 2022-03-02 DIAGNOSIS — G8929 Other chronic pain: Secondary | ICD-10-CM | POA: Diagnosis not present

## 2022-03-02 DIAGNOSIS — E785 Hyperlipidemia, unspecified: Secondary | ICD-10-CM | POA: Diagnosis not present

## 2022-03-02 DIAGNOSIS — M5416 Radiculopathy, lumbar region: Secondary | ICD-10-CM

## 2022-03-02 NOTE — Progress Notes (Signed)
Referral placed to neurosurgery for consult per patient request.  ?

## 2022-03-03 DIAGNOSIS — R7303 Prediabetes: Secondary | ICD-10-CM | POA: Diagnosis not present

## 2022-03-03 DIAGNOSIS — E785 Hyperlipidemia, unspecified: Secondary | ICD-10-CM | POA: Diagnosis not present

## 2022-03-09 DIAGNOSIS — I10 Essential (primary) hypertension: Secondary | ICD-10-CM | POA: Diagnosis not present

## 2022-03-09 DIAGNOSIS — R7303 Prediabetes: Secondary | ICD-10-CM | POA: Diagnosis not present

## 2022-03-09 DIAGNOSIS — Z6828 Body mass index (BMI) 28.0-28.9, adult: Secondary | ICD-10-CM | POA: Diagnosis not present

## 2022-03-09 DIAGNOSIS — E785 Hyperlipidemia, unspecified: Secondary | ICD-10-CM | POA: Diagnosis not present
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
## 2022-03-17 ENCOUNTER — Ambulatory Visit: Payer: Medicare Other | Admitting: Internal Medicine

## 2022-03-18 ENCOUNTER — Encounter: Payer: Self-pay | Admitting: Physical Medicine and Rehabilitation

## 2022-03-19 ENCOUNTER — Other Ambulatory Visit: Payer: Self-pay | Admitting: Physical Medicine and Rehabilitation

## 2022-03-19 ENCOUNTER — Telehealth: Payer: Self-pay | Admitting: Physical Medicine and Rehabilitation

## 2022-03-19 DIAGNOSIS — M5416 Radiculopathy, lumbar region: Secondary | ICD-10-CM

## 2022-03-19 NOTE — Telephone Encounter (Signed)
Attempted to call patient and update on status of neurosurgery and lumbar epidural steroid injection, I was unable to speak with him, but did leave message. We are waiting to schedule injection, and [facility] will call him to schedule appointment with Dr. Ronnald Ramp.

## 2022-03-19 NOTE — Telephone Encounter (Signed)
Do you mind looking into the referral please?

## 2022-03-24 ENCOUNTER — Ambulatory Visit: Payer: Medicare Other | Admitting: Internal Medicine

## 2022-03-26 ENCOUNTER — Telehealth: Payer: Self-pay | Admitting: Physical Medicine and Rehabilitation

## 2022-03-26 NOTE — Telephone Encounter (Signed)
We have attempted to contact patient multiple times, we have not been able to talk with patient to update him on plan of care. We left message with his wife today to call us back.

## 2022-03-30 ENCOUNTER — Encounter: Payer: Self-pay | Admitting: Physical Medicine and Rehabilitation

## 2022-04-02 DIAGNOSIS — E785 Hyperlipidemia, unspecified: Secondary | ICD-10-CM | POA: Diagnosis not present

## 2022-04-02 DIAGNOSIS — G8929 Other chronic pain: Secondary | ICD-10-CM | POA: Diagnosis not present
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
## 2022-04-09 ENCOUNTER — Ambulatory Visit (INDEPENDENT_AMBULATORY_CARE_PROVIDER_SITE_OTHER): Payer: Medicare Other | Admitting: Internal Medicine

## 2022-04-09 ENCOUNTER — Encounter: Payer: Self-pay | Admitting: Internal Medicine

## 2022-04-09 DIAGNOSIS — G4752 REM sleep behavior disorder: Secondary | ICD-10-CM | POA: Diagnosis not present

## 2022-04-09 DIAGNOSIS — G4733 Obstructive sleep apnea (adult) (pediatric): Secondary | ICD-10-CM | POA: Diagnosis not present

## 2022-04-09 DIAGNOSIS — F5101 Primary insomnia: Secondary | ICD-10-CM

## 2022-04-09 NOTE — Assessment & Plan Note (Signed)
He chooses to manage by keeping his weight down now.  Discussed.

## 2022-04-09 NOTE — Patient Instructions (Signed)
We can continue clonazepam 0.5 mg- 4 tabs per night  You can contact us for refills when needed  Keep your weight down so the sleep apnea problem doesn't flare up again

## 2022-04-09 NOTE — Assessment & Plan Note (Signed)
Clonazepam given for RBD also helps this.  Insomnia does not seem to be an intrusive concern currently.

## 2022-04-09 NOTE — Assessment & Plan Note (Signed)
He finds 2 mg of clonazepam comfortable and effective for managing symptoms with no progression. He appears stable for now with no obvious Parkinson symptoms of progression. -Continue clonazepam

## 2022-04-14 ENCOUNTER — Telehealth: Payer: Self-pay | Admitting: *Deleted

## 2022-04-14 NOTE — Telephone Encounter (Signed)
(  Late entry for 12/18/21 visit with Dr. Marlou Sa). Seen in clinic today for Ortho bundle post op visit.

## 2022-04-16 ENCOUNTER — Ambulatory Visit: Payer: Self-pay

## 2022-04-16 ENCOUNTER — Encounter: Payer: Self-pay | Admitting: Physical Medicine and Rehabilitation

## 2022-04-16 ENCOUNTER — Telehealth: Payer: Self-pay | Admitting: *Deleted

## 2022-04-16 ENCOUNTER — Ambulatory Visit (INDEPENDENT_AMBULATORY_CARE_PROVIDER_SITE_OTHER): Payer: Medicare Other | Admitting: Physical Medicine and Rehabilitation

## 2022-04-16 VITALS — BP 134/85 | HR 77

## 2022-04-16 DIAGNOSIS — M5416 Radiculopathy, lumbar region: Secondary | ICD-10-CM | POA: Diagnosis not present

## 2022-04-16 MED ORDER — HYDROCODONE-ACETAMINOPHEN 5-325 MG PO TABS: 1.0000 | ORAL_TABLET | Freq: Three times a day (TID) | ORAL | 0 refills | Status: AC | PRN

## 2022-04-16 MED ORDER — METHYLPREDNISOLONE ACETATE 80 MG/ML IJ SUSP
80.0000 mg | Freq: Once | INTRAMUSCULAR | Status: AC
  Administered 2022-04-16: 80 mg

## 2022-04-16 NOTE — Telephone Encounter (Signed)
Ortho bundle 90 day call completed. 

## 2022-04-16 NOTE — Patient Instructions (Signed)

## 2022-04-16 NOTE — Progress Notes (Unsigned)
Pt state lower back pain that travels down his left calf and foot. Pt state laying down makes the pain worse.  Pt state he takes pain meds and uses pain cream to help ease his pain. Pt state last inj helped a little bit.  Numeric Pain Rating Scale and Functional Assessment Average Pain 4   In the last MONTH (on 0-10 scale) has pain interfered with the following?  1. General activity like being  able to carry out your everyday physical activities such as walking, climbing stairs, carrying groceries, or moving a chair?  Rating(10)   +Driver, -BT, -Dye Allergies.

## 2022-04-21 DIAGNOSIS — M5416 Radiculopathy, lumbar region: Secondary | ICD-10-CM | POA: Diagnosis not present

## 2022-04-21 DIAGNOSIS — Z6828 Body mass index (BMI) 28.0-28.9, adult: Secondary | ICD-10-CM | POA: Diagnosis not present

## 2022-04-22 ENCOUNTER — Other Ambulatory Visit: Payer: Self-pay | Admitting: Surgical

## 2022-04-22 ENCOUNTER — Encounter: Payer: Self-pay | Admitting: Physical Medicine and Rehabilitation

## 2022-04-22 DIAGNOSIS — M5416 Radiculopathy, lumbar region: Secondary | ICD-10-CM | POA: Diagnosis not present

## 2022-04-22 MED ORDER — METHOCARBAMOL 500 MG PO TABS: 500.0000 mg | ORAL_TABLET | Freq: Three times a day (TID) | ORAL | 2 refills | Status: DC | PRN

## 2022-05-13 DIAGNOSIS — M5416 Radiculopathy, lumbar region: Secondary | ICD-10-CM | POA: Diagnosis not present

## 2022-05-19 ENCOUNTER — Encounter: Payer: Self-pay | Admitting: Physical Medicine and Rehabilitation

## 2022-05-19 DIAGNOSIS — M5416 Radiculopathy, lumbar region: Secondary | ICD-10-CM | POA: Diagnosis not present

## 2022-05-19 DIAGNOSIS — Z6828 Body mass index (BMI) 28.0-28.9, adult: Secondary | ICD-10-CM | POA: Diagnosis not present

## 2022-05-20 ENCOUNTER — Other Ambulatory Visit: Payer: Self-pay | Admitting: Physical Medicine and Rehabilitation

## 2022-05-20 MED ORDER — METHOCARBAMOL 500 MG PO TABS: 500.0000 mg | ORAL_TABLET | Freq: Three times a day (TID) | ORAL | 2 refills | Status: DC | PRN

## 2022-05-23 DIAGNOSIS — S61412A Laceration without foreign body of left hand, initial encounter: Secondary | ICD-10-CM | POA: Diagnosis not present

## 2022-05-23 DIAGNOSIS — S61012A Laceration without foreign body of left thumb without damage to nail, initial encounter: Secondary | ICD-10-CM | POA: Diagnosis not present

## 2022-05-23 DIAGNOSIS — S63002A Unspecified subluxation of left wrist and hand, initial encounter: Secondary | ICD-10-CM | POA: Diagnosis not present

## 2022-05-23 DIAGNOSIS — Z79899 Other long term (current) drug therapy: Secondary | ICD-10-CM | POA: Diagnosis not present

## 2022-05-23 DIAGNOSIS — S62202A Unspecified fracture of first metacarpal bone, left hand, initial encounter for closed fracture: Secondary | ICD-10-CM | POA: Diagnosis not present

## 2022-05-23 DIAGNOSIS — S62252A Displaced fracture of neck of first metacarpal bone, left hand, initial encounter for closed fracture: Secondary | ICD-10-CM | POA: Diagnosis not present

## 2022-05-23 DIAGNOSIS — E7800 Pure hypercholesterolemia, unspecified: Secondary | ICD-10-CM | POA: Diagnosis not present

## 2022-05-26 DIAGNOSIS — S61012A Laceration without foreign body of left thumb without damage to nail, initial encounter: Secondary | ICD-10-CM | POA: Diagnosis not present

## 2022-05-26 DIAGNOSIS — S62242B Displaced fracture of shaft of first metacarpal bone, left hand, initial encounter for open fracture: Secondary | ICD-10-CM | POA: Diagnosis not present

## 2022-05-26 DIAGNOSIS — M79642 Pain in left hand: Secondary | ICD-10-CM | POA: Diagnosis not present

## 2022-05-29 DIAGNOSIS — Z6828 Body mass index (BMI) 28.0-28.9, adult: Secondary | ICD-10-CM | POA: Diagnosis not present

## 2022-05-29 DIAGNOSIS — F323 Major depressive disorder, single episode, severe with psychotic features: Secondary | ICD-10-CM | POA: Diagnosis not present

## 2022-06-01 DIAGNOSIS — Z0000 Encounter for general adult medical examination without abnormal findings: Secondary | ICD-10-CM | POA: Diagnosis not present

## 2022-06-01 DIAGNOSIS — Z1331 Encounter for screening for depression: Secondary | ICD-10-CM | POA: Diagnosis not present

## 2022-06-01 DIAGNOSIS — Z139 Encounter for screening, unspecified: Secondary | ICD-10-CM | POA: Diagnosis not present

## 2022-06-01 DIAGNOSIS — Z0189 Encounter for other specified special examinations: Secondary | ICD-10-CM | POA: Diagnosis not present

## 2022-06-01 DIAGNOSIS — Z1389 Encounter for screening for other disorder: Secondary | ICD-10-CM | POA: Diagnosis not present

## 2022-06-01 DIAGNOSIS — Z136 Encounter for screening for cardiovascular disorders: Secondary | ICD-10-CM | POA: Diagnosis not present

## 2022-06-01 DIAGNOSIS — Z1339 Encounter for screening examination for other mental health and behavioral disorders: Secondary | ICD-10-CM | POA: Diagnosis not present

## 2022-06-02 DIAGNOSIS — G8929 Other chronic pain: Secondary | ICD-10-CM | POA: Diagnosis not present

## 2022-06-02 DIAGNOSIS — E785 Hyperlipidemia, unspecified: Secondary | ICD-10-CM | POA: Diagnosis not present

## 2022-06-05 DIAGNOSIS — S61012A Laceration without foreign body of left thumb without damage to nail, initial encounter: Secondary | ICD-10-CM | POA: Diagnosis not present

## 2022-06-05 DIAGNOSIS — S62242B Displaced fracture of shaft of first metacarpal bone, left hand, initial encounter for open fracture: Secondary | ICD-10-CM | POA: Diagnosis not present

## 2022-06-12 DIAGNOSIS — Z6830 Body mass index (BMI) 30.0-30.9, adult: Secondary | ICD-10-CM | POA: Diagnosis not present

## 2022-06-12 DIAGNOSIS — F32A Depression, unspecified: Secondary | ICD-10-CM | POA: Diagnosis not present

## 2022-06-29 DIAGNOSIS — Z6830 Body mass index (BMI) 30.0-30.9, adult: Secondary | ICD-10-CM | POA: Diagnosis not present

## 2022-06-29 DIAGNOSIS — F32A Depression, unspecified: Secondary | ICD-10-CM | POA: Diagnosis not present

## 2022-07-03 DIAGNOSIS — E785 Hyperlipidemia, unspecified: Secondary | ICD-10-CM | POA: Diagnosis not present

## 2022-07-03 DIAGNOSIS — I10 Essential (primary) hypertension: Secondary | ICD-10-CM | POA: Diagnosis not present

## 2022-07-13 DIAGNOSIS — M5416 Radiculopathy, lumbar region: Secondary | ICD-10-CM | POA: Diagnosis not present

## 2022-07-13 DIAGNOSIS — M48062 Spinal stenosis, lumbar region with neurogenic claudication: Secondary | ICD-10-CM | POA: Diagnosis not present

## 2022-07-14 ENCOUNTER — Telehealth: Payer: Self-pay | Admitting: Pharmacy Technician

## 2022-07-14 DIAGNOSIS — Z596 Low income: Secondary | ICD-10-CM

## 2022-07-14 NOTE — Progress Notes (Signed)
[facility])                                            [place] Team    07/14/2022  Caleb Woodward 1946/08/26 [id number]                                      Medication Assistance Referral  Referral From: [place]   Medication/Company: [employer] / [place] Patient application portion:  Education officer, museum portion: Faxed  to Dr. Maryella Shivers Provider address/fax verified via: Office website    Caleb Woodward, [place]  [phone]

## 2022-07-26 ENCOUNTER — Other Ambulatory Visit: Payer: Self-pay | Admitting: Internal Medicine

## 2022-07-26 DIAGNOSIS — G4752 REM sleep behavior disorder: Secondary | ICD-10-CM

## 2022-07-27 DIAGNOSIS — H524 Presbyopia: Secondary | ICD-10-CM | POA: Diagnosis not present

## 2022-07-27 DIAGNOSIS — H2513 Age-related nuclear cataract, bilateral: Secondary | ICD-10-CM | POA: Diagnosis not present

## 2022-07-27 DIAGNOSIS — H5203 Hypermetropia, bilateral: Secondary | ICD-10-CM | POA: Diagnosis not present

## 2022-07-27 NOTE — Telephone Encounter (Signed)
Please advise on refill request.  Allergies  Allergen Reactions   Cymbalta [Duloxetine Hcl] Nausea And Vomiting   Meloxicam Nausea And Vomiting    Current Outpatient Medications:    amoxicillin (AMOXIL) 500 MG tablet, Take 2g 1 hour prior to dental procedure, Disp: 10 tablet, Rfl: 0   b complex vitamins tablet, Take 1 tablet by mouth daily., Disp: , Rfl:    Cholecalciferol 125 MCG (5000 UT) capsule, Take 5,000 Units by mouth daily., Disp: , Rfl:    citalopram (CELEXA) 10 MG tablet, Take 10 mg by mouth daily., Disp: , Rfl:    clonazePAM (KLONOPIN) 0.5 MG tablet, TAKE 1 TO 4 TABLETS BY MOUTH EVERY AT BEDTIME AS NEEDED SLEEP, Disp: 120 tablet, Rfl: 5   diclofenac Sodium (VOLTAREN) 1 % GEL, Apply 2 g topically 4 (four) times daily. (Patient taking differently: Apply 2 g topically 4 (four) times daily as needed (knee pain).), Disp: 350 g, Rfl: 1   ezetimibe (ZETIA) 10 MG tablet, Take 10 mg by mouth daily., Disp: , Rfl:    Magnesium 400 MG TABS, Take 400 mg by mouth daily., Disp: , Rfl:    methocarbamol (ROBAXIN) 500 MG tablet, Take 1 tablet (500 mg total) by mouth every 8 (eight) hours as needed for muscle spasms., Disp: 60 tablet, Rfl: 2   Multiple Vitamin (MULTIVITAMIN WITH MINERALS) TABS tablet, Take 1 tablet by mouth daily., Disp: , Rfl:    rosuvastatin (CRESTOR) 5 MG tablet, Take 5 mg by mouth daily., Disp: , Rfl:

## 2022-07-27 NOTE — Telephone Encounter (Signed)
Clonazepam refilled 

## 2022-07-29 ENCOUNTER — Telehealth: Payer: Self-pay | Admitting: Pharmacy Technician

## 2022-07-29 DIAGNOSIS — Z596 Low income: Secondary | ICD-10-CM

## 2022-07-29 NOTE — Progress Notes (Signed)
[place] [facility])                                            [place] Team    07/29/2022  Naseem Varden 1946/09/22 [id number]  Received both patient and provider portion(s) of patient assistance application(s) for [place]. Faxed completed application and required documents into [place].   Dayvion Sans P. Naima Veldhuizen, [place]  [phone]

## 2022-08-02 DIAGNOSIS — I10 Essential (primary) hypertension: Secondary | ICD-10-CM | POA: Diagnosis not present

## 2022-08-02 DIAGNOSIS — E785 Hyperlipidemia, unspecified: Secondary | ICD-10-CM | POA: Diagnosis not present

## 2022-08-21 DIAGNOSIS — J069 Acute upper respiratory infection, unspecified: Secondary | ICD-10-CM | POA: Diagnosis not present

## 2022-08-21 DIAGNOSIS — Z1152 Encounter for screening for COVID-19: Secondary | ICD-10-CM | POA: Diagnosis not present

## 2022-08-21 DIAGNOSIS — B9789 Other viral agents as the cause of diseases classified elsewhere: Secondary | ICD-10-CM | POA: Diagnosis not present

## 2022-08-26 ENCOUNTER — Telehealth: Payer: Self-pay | Admitting: Pharmacy Technician

## 2022-08-26 DIAGNOSIS — Z596 Low income: Secondary | ICD-10-CM

## 2022-08-26 NOTE — Progress Notes (Signed)
[name])                                            [place] Team    08/26/2022  Caleb Woodward 08/08/1946 [id number]  Care coordination call placed to [place] in regard to [place] application.  Spoke to [employer] who informs a Field seismologist is needed in addition to the information on the application.   Care coordination made to [facility] at Dr. Nyra Capes office. She faxed over the script which was faxed to [place].  [name] P. [name], [name]  [phone]

## 2022-09-02 DIAGNOSIS — E785 Hyperlipidemia, unspecified: Secondary | ICD-10-CM | POA: Diagnosis not present

## 2022-09-02 DIAGNOSIS — I10 Essential (primary) hypertension: Secondary | ICD-10-CM | POA: Diagnosis not present

## 2022-09-14 DIAGNOSIS — E785 Hyperlipidemia, unspecified: Secondary | ICD-10-CM | POA: Diagnosis not present

## 2022-09-14 DIAGNOSIS — R7303 Prediabetes: Secondary | ICD-10-CM | POA: Diagnosis not present

## 2022-09-14 DIAGNOSIS — I10 Essential (primary) hypertension: Secondary | ICD-10-CM | POA: Diagnosis not present

## 2022-09-14 DIAGNOSIS — Z125 Encounter for screening for malignant neoplasm of prostate: Secondary | ICD-10-CM | POA: Diagnosis not present

## 2022-09-21 DIAGNOSIS — Z139 Encounter for screening, unspecified: Secondary | ICD-10-CM | POA: Diagnosis not present

## 2022-09-21 DIAGNOSIS — F418 Other specified anxiety disorders: Secondary | ICD-10-CM | POA: Diagnosis not present

## 2022-09-21 DIAGNOSIS — Z6831 Body mass index (BMI) 31.0-31.9, adult: Secondary | ICD-10-CM | POA: Diagnosis not present

## 2022-09-21 DIAGNOSIS — I10 Essential (primary) hypertension: Secondary | ICD-10-CM | POA: Diagnosis not present

## 2022-09-21 DIAGNOSIS — Z23 Encounter for immunization: Secondary | ICD-10-CM | POA: Diagnosis not present

## 2022-09-21 DIAGNOSIS — E785 Hyperlipidemia, unspecified: Secondary | ICD-10-CM | POA: Diagnosis not present

## 2022-09-21 DIAGNOSIS — R7303 Prediabetes: Secondary | ICD-10-CM | POA: Diagnosis not present

## 2022-09-21 DIAGNOSIS — Z1331 Encounter for screening for depression: Secondary | ICD-10-CM | POA: Diagnosis not present

## 2022-09-28 ENCOUNTER — Telehealth: Payer: Self-pay | Admitting: Pharmacy Technician

## 2022-09-28 ENCOUNTER — Telehealth: Payer: Self-pay

## 2022-09-28 DIAGNOSIS — Z596 Low income: Secondary | ICD-10-CM

## 2022-09-28 NOTE — Progress Notes (Signed)
[facility])  [place]   09/28/2022  Thaison Kolodziejski 1946/05/16 [id number]   2024 Medication Assistance Renewal Application Summary:  Patient was outreached by [place] Team regarding medication assistance renewal for 2024. Verified address, anticipated insurance for 2024, and income has not changed. Patient remains interested in PAP for 2024 for [place].  Plan: I will route patient assistance letter to [place] technician who will coordinate patient assistance program application process for medications listed above.  [facility] pharmacy technician will assist with obtaining all required documents from both patient and provider(s) and submit application(s) once completed.    Thank you for allowing pharmacy to be a part of this patient's care.   Reed Breech, PharmD Clinical Pharmacist  [place] [phone]

## 2022-09-28 NOTE — Progress Notes (Signed)
[place] [facility])                                            [place] Team    09/28/2022  Jahmari Esbenshade 09-06-1946 [id number]  Care coordination call placed to [place] in regard to [place] application.  Spoke to [place] who informs patient is APPROVED 09/23/22-11/01/22. She informs medication will be delivered to patient's home via Fedex. Patient will need to reapply for 2024.  Jawann Urbani P. Kenora Spayd, [place]  [phone]

## 2022-10-01 ENCOUNTER — Telehealth: Payer: Self-pay | Admitting: Pharmacy Technician

## 2022-10-01 DIAGNOSIS — Z596 Low income: Secondary | ICD-10-CM

## 2022-10-01 NOTE — Progress Notes (Signed)
[facility])                                            [place] Team    10/01/2022  Dashiel Bergquist June 10, 1946 [id number]                                      Medication Assistance Referral-FOR 2024 RE ENROLLMENT  Referral From: [place]   Medication/Company: [employer] / [place] Patient application portion:  Education officer, museum portion: Faxed  to Dr. Maryella Shivers Provider address/fax verified via: Office website    Elim Peale P. Aaryan Essman, [place]  [phone]

## 2022-10-02 DIAGNOSIS — F418 Other specified anxiety disorders: Secondary | ICD-10-CM | POA: Diagnosis not present

## 2022-10-02 DIAGNOSIS — F4321 Adjustment disorder with depressed mood: Secondary | ICD-10-CM | POA: Diagnosis not present

## 2022-11-02 DIAGNOSIS — F418 Other specified anxiety disorders: Secondary | ICD-10-CM | POA: Diagnosis not present

## 2022-11-02 DIAGNOSIS — F4321 Adjustment disorder with depressed mood: Secondary | ICD-10-CM | POA: Diagnosis not present

## 2022-11-06 ENCOUNTER — Telehealth: Payer: Self-pay | Admitting: Pharmacy Technician

## 2022-11-06 DIAGNOSIS — Z596 Low income: Secondary | ICD-10-CM

## 2022-11-06 NOTE — Progress Notes (Signed)
[facility] [facility])                                            [place] Team    11/06/2022  Joas Motton [birth year] [id number]  Received both patient and provider  portion(s) of patient assistance application(s) for [place]. Faxed completed application and required documents into [place].    Aylssa Herrig P. Keatin [place], [place]  [phone]

## 2022-11-10 DIAGNOSIS — U071 COVID-19: Secondary | ICD-10-CM | POA: Diagnosis not present

## 2022-11-10 DIAGNOSIS — Z139 Encounter for screening, unspecified: Secondary | ICD-10-CM | POA: Diagnosis not present

## 2022-12-03 DIAGNOSIS — F4321 Adjustment disorder with depressed mood: Secondary | ICD-10-CM | POA: Diagnosis not present

## 2022-12-03 DIAGNOSIS — F418 Other specified anxiety disorders: Secondary | ICD-10-CM | POA: Diagnosis not present

## 2022-12-07 ENCOUNTER — Telehealth: Payer: Self-pay | Admitting: Pharmacy Technician

## 2022-12-07 DIAGNOSIS — Z596 Low income: Secondary | ICD-10-CM

## 2022-12-07 NOTE — Progress Notes (Signed)
[facility])                                            [place] Team    12/07/2022  Ashon Rosenberg Jul 23, 1946 [id number]  Care coordination call placed to [place] in regard to [place] application.  Spoke to [place] who informs patient is APPROVED 11/25/22-10/23/23. Medication will auto fill and ship to patient's address on file.  Lindsi Bayliss P. Genevia Bouldin, [place]  [phone]

## 2022-12-23 ENCOUNTER — Encounter: Payer: Self-pay | Admitting: Orthopedic Surgery

## 2022-12-23 ENCOUNTER — Ambulatory Visit (INDEPENDENT_AMBULATORY_CARE_PROVIDER_SITE_OTHER): Payer: Medicare Other | Admitting: Orthopedic Surgery

## 2022-12-23 ENCOUNTER — Ambulatory Visit (INDEPENDENT_AMBULATORY_CARE_PROVIDER_SITE_OTHER): Payer: Medicare Other

## 2022-12-23 DIAGNOSIS — Z96652 Presence of left artificial knee joint: Secondary | ICD-10-CM

## 2022-12-23 NOTE — Progress Notes (Signed)
Office Visit Note   Patient: Caleb Woodward           Date of Birth: 1946-10-13           MRN: [id number] Visit Date: 12/23/2022 Requested by: Maryella Shivers, MD [address] [place],  [place] [address] PCP: Maryella Shivers, MD  Subjective: Chief Complaint  Patient presents with   Left Knee - Pain    HPI: Caleb Woodward is a 77 y.o. male who presents to the office reporting left knee pain.  Patient underwent left total knee replacement 11/20/2021.  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  Does report having 1 impact injury to the left knee several weeks ago.  Describes both medial and lateral pain around the tibial plateau region.  Pain will occasionally radiate down the left leg.  Denies any right-sided symptoms.  At times he states when he is laying down he feels like there is slight electricity running down the leg.  Has a history of 2 back surgeries.  Symptoms worse with walking but with rest they improved..                ROS: All systems reviewed are negative as they relate to the chief complaint within the history of present illness.  Patient denies fevers or chills.  Assessment & Plan: Visit Diagnoses:  1. Status post total left knee replacement     Plan: Impression is left knee pain with proximal tibial pain.  Mild effusion is present but there is no warmth to the knee and radiographs look good.  I think it is possible this could be referred pain from the back.  Plan is Medrol Dosepak 6-day course with 4-week return and decision for or against imaging of the back at that time which would be an MRI scan.  This could be overuse of the knee which could be giving him trouble.  Not really having much in the way of start up pain classically.  Follow-up in 4 weeks for clinical recheck.  Follow-Up Instructions: No follow-ups on file.   Orders:  Orders Placed This Encounter  Procedures   XR KNEE  3 VIEW LEFT   No orders of the defined types were placed in this encounter.     Procedures: No procedures performed   Clinical Data: No additional findings.  Objective: Vital Signs: There were no vitals taken for this visit.  Physical Exam:  Constitutional: Patient appears well-developed HEENT:  Head: Normocephalic Eyes:EOM are normal Neck: Normal range of motion Cardiovascular: Normal rate Pulmonary/chest: Effort normal Neurologic: Patient is alert Skin: Skin is warm Psychiatric: Patient has normal mood and affect  Ortho Exam: Ortho exam demonstrates trace effusion left knee no effusion right knee.  No warmth to the left knee.  Range of motion is excellent.  Patient has good stability to varus valgus stress at 0 30 and 90 degrees with excellent patella tracking.  No nerve root tension signs on the left.  No masses lymphadenopathy or skin changes noted in that left knee region.  No definite paresthesias L1-S1 bilaterally.  Specialty Comments:  MRI LUMBAR SPINE WITHOUT AND WITH CONTRAST     TECHNIQUE:  Multiplanar and multiecho pulse sequences of the lumbar spine were  obtained without and with intravenous contrast.     CONTRAST:  75m GADAVIST GADOBUTROL 1 MMOL/ML IV SOLN     COMPARISON:  02/01/2018  FINDINGS:  Segmentation:  Standard lumbar numbering     Alignment:  Physiologic     Vertebrae:  No fracture, evidence of discitis, or bone lesion.     Conus medullaris and cauda equina: Conus extends to the T12-L1  level. Conus and cauda equina appear normal.     Paraspinal and other soft tissues: Postoperative scarring to a mild  degree in the low left lumbar spine.     Disc levels:     T12- L1: Ventral disc bulging.     L1-L2: Mild disc narrowing and bulging     L2-L3: Disc narrowing and bulging with posterior annular fissure and  tiny right paracentral protrusion. Encroachment on the right L3  nerve root at the subarticular recess which is progressed.      L3-L4: Disc narrowing and bulging. Facet spurring and ligamentum  flavum thickening. High-grade spinal stenosis with cauda equina  crowding, unchanged. Patent foramina     L4-L5: Disc narrowing and bulging with a newright eccentric  herniation which compresses the thecal sac to an advanced degree,  especially on the right where there is asymmetric facet spurring and  ligamentum flavum thickening post left laminotomy and partial  facetectomy. There is an inferiorly migrating extrusion component to  the mid L5 body level which impinges severely on the right L5 nerve  root.     L5-S1:Disc collapse and endplate degeneration with endplate ridging.  Degenerative facet spurring.     IMPRESSION:  1. Symptomatic finding is likely at L4-5 where there is a new  herniation causing high-grade spinal stenosis. An inferiorly and  rightward migrating extrusion component tracks along the right L5  nerve root which is severely compressed.  2. L3-4 chronic moderate to advanced spinal stenosis due to  degenerative disease and short pedicles.  3. L2-3 right subarticular recess narrowing crowding the right L3  nerve root.        Electronically Signed    By: Monte Fantasia M.D.    On: 01/27/2021 08:09  Imaging: XR KNEE 3 VIEW LEFT  Result Date: 12/23/2022 AP lateral radiographs merchant radiographs left knee reviewed.  Total knee prosthesis in good position alignment with no complicating features.    PMFS History: Patient Active Problem List   Diagnosis Date Noted   Arthritis of left knee    S/P total knee arthroplasty, left 11/20/2021   Abnormality of gait 01/21/2021   Tendonitis 08/29/2020   Plantar fasciitis 08/29/2020   Neuropathic pain 08/29/2020   Myalgia 02/29/2020   Spondylosis without myelopathy or radiculopathy, lumbar region 12/26/2019   Facet arthropathy 11/28/2019   Failed back syndrome 09/05/2019   Slow heart rate 08/30/2018   Chronic pain syndrome 07/27/2018    Obstructive sleep apnea 09/01/2017   Insomnia 09/01/2017   RBD (REM behavioral disorder) 09/01/2017   Lumbar stenosis 09/15/2016   Cervical stenosis of spinal canal 02/06/2014   Past Medical History:  Diagnosis Date   Cancer ([place]) 2012   Prostate cancer   Elevated cholesterol    GERD (gastroesophageal reflux disease)    H/O hiatal hernia    Headache(784.0)    due to accident in the past   Neuromuscular disorder ([place])    numbness and tingling   Sleep apnea     Family History  Problem Relation Age of Onset   Diabetes Mother     Past Surgical History:  Procedure Laterality Date   ANTERIOR CERVICAL DECOMP/DISCECTOMY FUSION N/A 02/06/2014   Procedure: CERVICAL FIVE TO CERVICAL SIX, CERVICAL SIX TO  CERVICAL SEVEN  ANTERIOR CERVICAL DECOMPRESSION/DISCECTOMY FUSION 2 LEVELS;  Surgeon: Floyce Stakes, MD;  Location: [facility];  Service: Neurosurgery;  Laterality: N/A;  C5-6 C6-7 Anterior cervical decompression/diskectomy/fusion   COLONOSCOPY W/ BIOPSIES AND POLYPECTOMY     LUMBAR LAMINECTOMY/DECOMPRESSION MICRODISCECTOMY Left 09/15/2016   Procedure: LEFT LUMBAR FOUR-FIVE, LUMBAR FIVE-SACRAL ONE MICRODISCECTOMY;  Surgeon: Leeroy Cha, MD;  Location: [facility];  Service: Neurosurgery;  Laterality: Left;  LEFT L4-5 L5-S1 MICRODISCECTOMY   PROSTATE SURGERY  11/27/2010   TOTAL KNEE ARTHROPLASTY Left 11/20/2021   Procedure: LEFT TOTAL KNEE ARTHROPLASTY;  Surgeon: Meredith Pel, MD;  Location: [facility];  Service: Orthopedics;  Laterality: Left;   ULNAR NERVE REPAIR Left 2014   Social History   Occupational History   Not on file  Tobacco Use   Smoking status: Never   Smokeless tobacco: Never  Vaping Use   Vaping Use: Never used  Substance and Sexual Activity   Alcohol use: Yes    Comment: socially but rarely   Drug use: No   Sexual activity: Not on file

## 2022-12-24 MED ORDER — PREDNISONE 5 MG (21) PO TBPK: ORAL_TABLET | ORAL | 0 refills | Status: DC

## 2022-12-24 NOTE — Addendum Note (Signed)
Addended by: Webb Silversmith on: 12/24/2022 05:01 PM   Modules accepted: Orders

## 2023-01-01 DIAGNOSIS — F418 Other specified anxiety disorders: Secondary | ICD-10-CM | POA: Diagnosis not present

## 2023-01-01 DIAGNOSIS — F4321 Adjustment disorder with depressed mood: Secondary | ICD-10-CM | POA: Diagnosis not present

## 2023-01-08 DIAGNOSIS — R109 Unspecified abdominal pain: Secondary | ICD-10-CM | POA: Diagnosis not present

## 2023-01-08 DIAGNOSIS — Z6830 Body mass index (BMI) 30.0-30.9, adult: Secondary | ICD-10-CM | POA: Diagnosis not present

## 2023-01-22 ENCOUNTER — Other Ambulatory Visit: Payer: Self-pay | Admitting: Internal Medicine

## 2023-01-22 DIAGNOSIS — G4752 REM sleep behavior disorder: Secondary | ICD-10-CM

## 2023-01-25 ENCOUNTER — Other Ambulatory Visit: Payer: Self-pay | Admitting: Internal Medicine

## 2023-01-25 ENCOUNTER — Telehealth: Payer: Self-pay | Admitting: Internal Medicine

## 2023-01-25 DIAGNOSIS — G4752 REM sleep behavior disorder: Secondary | ICD-10-CM

## 2023-01-25 MED ORDER — CLONAZEPAM 0.5 MG PO TABS: ORAL_TABLET | ORAL | 5 refills | Status: DC

## 2023-01-25 NOTE — Telephone Encounter (Signed)
PT. CALLING NEEDS MORE REFILLS ON KLONOPIN) 0.5 MG

## 2023-01-25 NOTE — Telephone Encounter (Signed)
Klonopin refilled [facility] [place]

## 2023-01-25 NOTE — Telephone Encounter (Signed)
Spoke with patients wife advise Klonopin prescription has been refilled. NFN

## 2023-01-27 ENCOUNTER — Ambulatory Visit (INDEPENDENT_AMBULATORY_CARE_PROVIDER_SITE_OTHER): Payer: Medicare Other | Admitting: Surgical

## 2023-01-27 DIAGNOSIS — Z96652 Presence of left artificial knee joint: Secondary | ICD-10-CM | POA: Diagnosis not present

## 2023-01-30 ENCOUNTER — Encounter: Payer: Self-pay | Admitting: Orthopedic Surgery
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
## 2023-02-01 DIAGNOSIS — F4321 Adjustment disorder with depressed mood: Secondary | ICD-10-CM | POA: Diagnosis not present

## 2023-02-01 DIAGNOSIS — F418 Other specified anxiety disorders: Secondary | ICD-10-CM | POA: Diagnosis not present

## 2023-03-03 DIAGNOSIS — E785 Hyperlipidemia, unspecified: Secondary | ICD-10-CM | POA: Diagnosis not present

## 2023-03-03 DIAGNOSIS — F418 Other specified anxiety disorders: Secondary | ICD-10-CM | POA: Diagnosis not present

## 2023-03-22 DIAGNOSIS — E785 Hyperlipidemia, unspecified: Secondary | ICD-10-CM | POA: Diagnosis not present

## 2023-03-22 DIAGNOSIS — R7303 Prediabetes: Secondary | ICD-10-CM | POA: Diagnosis not present

## 2023-03-22 DIAGNOSIS — I10 Essential (primary) hypertension: Secondary | ICD-10-CM | POA: Diagnosis not present

## 2023-04-02 DIAGNOSIS — F418 Other specified anxiety disorders: Secondary | ICD-10-CM | POA: Diagnosis not present

## 2023-04-02 DIAGNOSIS — I10 Essential (primary) hypertension: Secondary | ICD-10-CM | POA: Diagnosis not present

## 2023-04-02 DIAGNOSIS — R7303 Prediabetes: Secondary | ICD-10-CM | POA: Diagnosis not present

## 2023-04-02 DIAGNOSIS — E785 Hyperlipidemia, unspecified: Secondary | ICD-10-CM | POA: Diagnosis not present

## 2023-04-02 DIAGNOSIS — Z6831 Body mass index (BMI) 31.0-31.9, adult: Secondary | ICD-10-CM | POA: Diagnosis not present

## 2023-04-03 DIAGNOSIS — E785 Hyperlipidemia, unspecified: Secondary | ICD-10-CM | POA: Diagnosis not present

## 2023-04-03 DIAGNOSIS — I10 Essential (primary) hypertension: Secondary | ICD-10-CM | POA: Diagnosis not present
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
## 2023-04-13 ENCOUNTER — Ambulatory Visit (INDEPENDENT_AMBULATORY_CARE_PROVIDER_SITE_OTHER): Payer: Medicare Other | Admitting: Internal Medicine

## 2023-04-13 ENCOUNTER — Encounter: Payer: Self-pay | Admitting: Internal Medicine

## 2023-04-13 VITALS — BP 128/62 | HR 72 | Ht 70.5 in | Wt 220.6 lb

## 2023-04-13 DIAGNOSIS — F5101 Primary insomnia: Secondary | ICD-10-CM

## 2023-04-13 DIAGNOSIS — M792 Neuralgia and neuritis, unspecified: Secondary | ICD-10-CM

## 2023-04-13 DIAGNOSIS — M5416 Radiculopathy, lumbar region: Secondary | ICD-10-CM | POA: Diagnosis not present

## 2023-04-13 DIAGNOSIS — G4752 REM sleep behavior disorder: Secondary | ICD-10-CM

## 2023-04-13 DIAGNOSIS — G4733 Obstructive sleep apnea (adult) (pediatric): Secondary | ICD-10-CM

## 2023-04-13 NOTE — Patient Instructions (Signed)
We discussed trying to get into a later bedtime habit. You need to decide how long you want to sleep and when you want bedtime and wake up time to be.

## 2023-04-14 ENCOUNTER — Encounter: Payer: Self-pay | Admitting: Internal Medicine

## 2023-04-14 NOTE — Assessment & Plan Note (Signed)
Clonazepam has worked well.  He is not looking to make changes in this.

## 2023-04-14 NOTE — Assessment & Plan Note (Signed)
He has been trying to lie down for sleep onset at 7 PM then wanting to wake around 6 or 7 AM.  I told him this was unrealistic.  We discussed a 6-8-hour goal of sleep per night.  He needs to start by delaying getting into bed until more consistent with his total sleep goals.

## 2023-04-14 NOTE — Assessment & Plan Note (Signed)
He has tried and failed CPAP and oral appliances and is not interested in [place] at this time. Plan-sleep off of back and keep weight down.

## 2023-04-14 NOTE — Assessment & Plan Note (Signed)
His wife indicates that this discomfort is contributing to his sleep disorder.

## 2023-04-22 ENCOUNTER — Telehealth: Payer: Self-pay | Admitting: Internal Medicine

## 2023-04-22 DIAGNOSIS — G4752 REM sleep behavior disorder: Secondary | ICD-10-CM

## 2023-04-22 NOTE — Telephone Encounter (Signed)
Debra states patient's RX for Clonazepam needs to be increased. Taking more medication. Debra phone number is [phone].

## 2023-04-23 MED ORDER — CLONAZEPAM 0.5 MG PO TABS: ORAL_TABLET | ORAL | 5 refills | Status: DC

## 2023-04-23 NOTE — Telephone Encounter (Signed)
Spoke with patients wife. She states patient has been taking 5 of his clonazepam at night. States at last OV this was discussed.  Dr. Maple Hudson please advise there requesting a new prescription so patient can take 5 at night

## 2023-04-23 NOTE — Telephone Encounter (Signed)
Clonazepam dose increased as requested

## 2023-04-30 DIAGNOSIS — F418 Other specified anxiety disorders: Secondary | ICD-10-CM | POA: Diagnosis not present

## 2023-04-30 DIAGNOSIS — Z6831 Body mass index (BMI) 31.0-31.9, adult: Secondary | ICD-10-CM | POA: Diagnosis not present

## 2023-04-30 DIAGNOSIS — I10 Essential (primary) hypertension: Secondary | ICD-10-CM | POA: Diagnosis not present

## 2023-04-30 NOTE — Telephone Encounter (Signed)
Called and left detailed message on Caleb Woodward's VM (DPR) with Dr. Roxy Cedar recommendation.  Nothing further at this time.

## 2023-05-03 DIAGNOSIS — E785 Hyperlipidemia, unspecified: Secondary | ICD-10-CM | POA: Diagnosis not present

## 2023-05-03 DIAGNOSIS — I10 Essential (primary) hypertension: Secondary | ICD-10-CM | POA: Diagnosis not present

## 2023-05-11 DIAGNOSIS — M47816 Spondylosis without myelopathy or radiculopathy, lumbar region: Secondary | ICD-10-CM | POA: Diagnosis not present

## 2023-05-11 DIAGNOSIS — M5126 Other intervertebral disc displacement, lumbar region: Secondary | ICD-10-CM | POA: Diagnosis not present

## 2023-05-11 DIAGNOSIS — M5136 Other intervertebral disc degeneration, lumbar region: Secondary | ICD-10-CM | POA: Diagnosis not present

## 2023-05-11 DIAGNOSIS — M48061 Spinal stenosis, lumbar region without neurogenic claudication: Secondary | ICD-10-CM | POA: Diagnosis not present

## 2023-05-11 DIAGNOSIS — M5416 Radiculopathy, lumbar region: Secondary | ICD-10-CM | POA: Diagnosis not present

## 2023-05-12 ENCOUNTER — Telehealth: Payer: Self-pay | Admitting: Internal Medicine

## 2023-05-12 NOTE — Telephone Encounter (Signed)
PT wife calling stating that [employer] needs a note stating Dr. wants this PT  to be on 5 sleeping pills a night so they will cover it. See last medication encounter for more information.   Wife's # is [phone]  He is running

## 2023-05-14 ENCOUNTER — Other Ambulatory Visit (HOSPITAL_COMMUNITY): Payer: Self-pay

## 2023-05-14 NOTE — Telephone Encounter (Signed)
Insurance will cover max of 4 tablets per day, test claim of 1mg  tablets shows covered for same dose with 2.5 tablets instead of 5 0.5mg  tablets.

## 2023-05-20 DIAGNOSIS — M5416 Radiculopathy, lumbar region: Secondary | ICD-10-CM | POA: Diagnosis not present

## 2023-05-21 NOTE — Telephone Encounter (Signed)
Message routed to Dr. Maple Hudson to advise

## 2023-05-24 ENCOUNTER — Other Ambulatory Visit: Payer: Self-pay | Admitting: Physical Medicine and Rehabilitation

## 2023-05-24 DIAGNOSIS — M5416 Radiculopathy, lumbar region: Secondary | ICD-10-CM

## 2023-05-24 DIAGNOSIS — M961 Postlaminectomy syndrome, not elsewhere classified: Secondary | ICD-10-CM

## 2023-05-24 MED ORDER — CLONAZEPAM 1 MG PO TABS: ORAL_TABLET | ORAL | 0 refills | Status: DC

## 2023-05-24 NOTE — Telephone Encounter (Signed)
Called and spoke with patient's wife Stanton Kidney (DPR).  Stanton Kidney is made aware prescription was changed.  Nothing further at this time.

## 2023-05-24 NOTE — Telephone Encounter (Signed)
I have  changed clonazepam script to 1 mg tabs.

## 2023-06-03 ENCOUNTER — Encounter: Payer: Self-pay | Admitting: Orthopedic Surgery

## 2023-06-03 ENCOUNTER — Other Ambulatory Visit (INDEPENDENT_AMBULATORY_CARE_PROVIDER_SITE_OTHER): Payer: Medicare Other

## 2023-06-03 ENCOUNTER — Ambulatory Visit (INDEPENDENT_AMBULATORY_CARE_PROVIDER_SITE_OTHER): Payer: Medicare Other | Admitting: Orthopedic Surgery

## 2023-06-03 DIAGNOSIS — Z0000 Encounter for general adult medical examination without abnormal findings: Secondary | ICD-10-CM | POA: Diagnosis not present

## 2023-06-03 DIAGNOSIS — Z1339 Encounter for screening examination for other mental health and behavioral disorders: Secondary | ICD-10-CM | POA: Diagnosis not present

## 2023-06-03 DIAGNOSIS — I10 Essential (primary) hypertension: Secondary | ICD-10-CM | POA: Diagnosis not present

## 2023-06-03 DIAGNOSIS — Z1389 Encounter for screening for other disorder: Secondary | ICD-10-CM | POA: Diagnosis not present

## 2023-06-03 DIAGNOSIS — M25551 Pain in right hip: Secondary | ICD-10-CM

## 2023-06-03 DIAGNOSIS — Z136 Encounter for screening for cardiovascular disorders: Secondary | ICD-10-CM | POA: Diagnosis not present

## 2023-06-03 DIAGNOSIS — Z1331 Encounter for screening for depression: Secondary | ICD-10-CM | POA: Diagnosis not present

## 2023-06-03 DIAGNOSIS — Z139 Encounter for screening, unspecified: Secondary | ICD-10-CM | POA: Diagnosis not present

## 2023-06-03 DIAGNOSIS — F418 Other specified anxiety disorders: Secondary | ICD-10-CM | POA: Diagnosis not present

## 2023-06-04 ENCOUNTER — Encounter: Payer: Self-pay | Admitting: Orthopedic Surgery

## 2023-06-04 NOTE — Progress Notes (Signed)
Office Visit Note   Patient: Caleb Woodward           Date of Birth: 10/13/1946           MRN: [id number] Visit Date: 06/03/2023 Requested by: Charlott Rakes, MD [address] [place],  [place] [address] PCP: Charlott Rakes, MD  Subjective: Chief Complaint  Patient presents with   Right Hip - Pain    HPI: Caleb Woodward is a 77 y.o. male who presents to the office reporting right hip and buttock pain for 1 month duration.  Denies any history of injury.  Denies any groin pain.  Denies numbness and tingling.  The pain does wake him from sleep at night.  He is being treated by Dr. Alvester Morin for fairly significant left-sided distal tibia pain which is potentially related to his prior surgery on his back.  Takes Robaxin and Tylenol and gabapentin.  Has more pain with squatting and sitting on the floor.  Localizes pain to the trochanteric region.  He also reports some pain when he bends over.  Sitting is not problematic.  Most of the pain happens at night and less so during the day.  Patient has had an MRI scan of the back which does show an L2-3 right paracentral disc.  His surgery previously on the lumbar spine was for predominantly left-sided symptoms..                ROS: All systems reviewed are negative as they relate to the chief complaint within the history of present illness.  Patient denies fevers or chills.  Assessment & Plan: Visit Diagnoses:  1. Pain in right hip     Plan: Impression is no right hip arthritis.  Radiographs are negative and his hip examination is negative for arthritis.  I think it is more likely that the right paracentral disc protrusion at L2-3 is the likely culprit for his "hip" pain.  He will follow-up as needed.  Follow-Up Instructions: No follow-ups on file.   Orders:  Orders Placed This Encounter  Procedures   XR HIP UNILAT W OR W/O PELVIS 2-3 VIEWS RIGHT   No orders of the defined types were placed in this encounter.      Procedures: No procedures performed   Clinical Data: No additional findings.  Objective: Vital Signs: There were no vitals taken for this visit.  Physical Exam:  Constitutional: Patient appears well-developed HEENT:  Head: Normocephalic Eyes:EOM are normal Neck: Normal range of motion Cardiovascular: Normal rate Pulmonary/chest: Effort normal Neurologic: Patient is alert Skin: Skin is warm Psychiatric: Patient has normal mood and affect  Ortho Exam: Ortho exam demonstrates no groin pain on the right or left-hand side with internal/external Tatian of either leg.  Patient has slight weakness to hip flexion on the right compared to the left at 5- out of 5.  No nerve root tension signs on the right with sitting.  Prone nerve root tension sign testing not performed.  No definite paresthesias on the right-hand side L1-S1.  No trochanteric tenderness to direct palpation on the right.  Specialty Comments:  MRI LUMBAR SPINE WITHOUT AND WITH CONTRAST     TECHNIQUE:  Multiplanar and multiecho pulse sequences of the lumbar spine were  obtained without and with intravenous contrast.     CONTRAST:  9mL GADAVIST GADOBUTROL 1 MMOL/ML IV SOLN     COMPARISON:  02/01/2018     FINDINGS:  Segmentation:  Standard lumbar numbering     Alignment:  Physiologic     Vertebrae:  No fracture, evidence of discitis, or bone lesion.     Conus medullaris and cauda equina: Conus extends to the T12-L1  level. Conus and cauda equina appear normal.     Paraspinal and other soft tissues: Postoperative scarring to a mild  degree in the low left lumbar spine.     Disc levels:     T12- L1: Ventral disc bulging.     L1-L2: Mild disc narrowing and bulging     L2-L3: Disc narrowing and bulging with posterior annular fissure and  tiny right paracentral protrusion. Encroachment on the right L3  nerve root at the subarticular recess which is progressed.     L3-L4: Disc narrowing and bulging. Facet  spurring and ligamentum  flavum thickening. High-grade spinal stenosis with cauda equina  crowding, unchanged. Patent foramina     L4-L5: Disc narrowing and bulging with a newright eccentric  herniation which compresses the thecal sac to an advanced degree,  especially on the right where there is asymmetric facet spurring and  ligamentum flavum thickening post left laminotomy and partial  facetectomy. There is an inferiorly migrating extrusion component to  the mid L5 body level which impinges severely on the right L5 nerve  root.     L5-S1:Disc collapse and endplate degeneration with endplate ridging.  Degenerative facet spurring.     IMPRESSION:  1. Symptomatic finding is likely at L4-5 where there is a new  herniation causing high-grade spinal stenosis. An inferiorly and  rightward migrating extrusion component tracks along the right L5  nerve root which is severely compressed.  2. L3-4 chronic moderate to advanced spinal stenosis due to  degenerative disease and short pedicles.  3. L2-3 right subarticular recess narrowing crowding the right L3  nerve root.        Electronically Signed    By: Marnee Spring M.D.    On: 01/27/2021 08:09  Imaging: No results found.   PMFS History: Patient Active Problem List   Diagnosis Date Noted   Arthritis of left knee    S/P total knee arthroplasty, left 11/20/2021   Abnormality of gait 01/21/2021   Tendonitis 08/29/2020   Plantar fasciitis 08/29/2020   Neuropathic pain 08/29/2020   Myalgia 02/29/2020   Spondylosis without myelopathy or radiculopathy, lumbar region 12/26/2019   Facet arthropathy 11/28/2019   Failed back syndrome 09/05/2019   Slow heart rate 08/30/2018   Chronic pain syndrome 07/27/2018   Obstructive sleep apnea 09/01/2017   Insomnia 09/01/2017   RBD (REM behavioral disorder) 09/01/2017   Lumbar stenosis 09/15/2016   Cervical stenosis of spinal canal 02/06/2014   Past Medical History:  Diagnosis Date    Cancer ([facility]) 2012   Prostate cancer   Elevated cholesterol    GERD (gastroesophageal reflux disease)    H/O hiatal hernia    Headache(784.0)    due to accident in the past   Neuromuscular disorder ([facility])    numbness and tingling   Sleep apnea     Family History  Problem Relation Age of Onset   Diabetes Mother     Past Surgical History:  Procedure Laterality Date   ANTERIOR CERVICAL DECOMP/DISCECTOMY FUSION N/A 02/06/2014   Procedure: CERVICAL FIVE TO CERVICAL SIX, CERVICAL SIX TO CERVICAL SEVEN  ANTERIOR CERVICAL DECOMPRESSION/DISCECTOMY FUSION 2 LEVELS;  Surgeon: Karn Cassis, MD;  Location: [facility];  Service: Neurosurgery;  Laterality: N/A;  C5-6 C6-7 Anterior cervical decompression/diskectomy/fusion   COLONOSCOPY W/ BIOPSIES AND POLYPECTOMY  LUMBAR LAMINECTOMY/DECOMPRESSION MICRODISCECTOMY Left 09/15/2016   Procedure: LEFT LUMBAR FOUR-FIVE, LUMBAR FIVE-SACRAL ONE MICRODISCECTOMY;  Surgeon: Hilda Lias, MD;  Location: [facility];  Service: Neurosurgery;  Laterality: Left;  LEFT L4-5 L5-S1 MICRODISCECTOMY   PROSTATE SURGERY  11/27/2010   TOTAL KNEE ARTHROPLASTY Left 11/20/2021   Procedure: LEFT TOTAL KNEE ARTHROPLASTY;  Surgeon: Cammy Copa, MD;  Location: [facility];  Service: Orthopedics;  Laterality: Left;   ULNAR NERVE REPAIR Left 2014   Social History   Occupational History   Not on file  Tobacco Use   Smoking status: Never   Smokeless tobacco: Never  Vaping Use   Vaping status: Never Used  Substance and Sexual Activity   Alcohol use: Yes    Comment: socially but rarely   Drug use: No   Sexual activity: Not on file

## 2023-06-14 ENCOUNTER — Ambulatory Visit (INDEPENDENT_AMBULATORY_CARE_PROVIDER_SITE_OTHER): Payer: Medicare Other | Admitting: Physical Medicine and Rehabilitation

## 2023-06-14 ENCOUNTER — Other Ambulatory Visit: Payer: Self-pay

## 2023-06-14 VITALS — BP 160/85 | HR 75

## 2023-06-14 DIAGNOSIS — M5416 Radiculopathy, lumbar region: Secondary | ICD-10-CM | POA: Diagnosis not present

## 2023-06-14 MED ORDER — METHYLPREDNISOLONE ACETATE 80 MG/ML IJ SUSP
80.0000 mg | Freq: Once | INTRAMUSCULAR | Status: AC
  Administered 2023-06-14: 80 mg

## 2023-06-14 NOTE — Progress Notes (Signed)
Functional Pain Scale - descriptive words and definitions  Uncomfortable (3)  Pain is present but can complete all ADL's/sleep is slightly affected and passive distraction only gives marginal relief. Mild range order  Average Pain  varies   +Driver, -BT, -Dye Allergies.  Lower back pain on left side that is getting better. Pain in the left knee to the ankle

## 2023-06-14 NOTE — Patient Instructions (Signed)

## 2023-06-19 NOTE — Procedures (Signed)
Lumbosacral Transforaminal Epidural Steroid Injection - Sub-Pedicular Approach with Fluoroscopic Guidance  Patient: Caleb Woodward      Date of Birth: 1945/11/27 MRN: [id number] PCP: Charlott Rakes, MD      Visit Date: 06/14/2023   Universal Protocol:    Date/Time: 06/14/2023  Consent Given By: the patient  Position: PRONE  Additional Comments: Vital signs were monitored before and after the procedure. Patient was prepped and draped in the usual sterile fashion. The correct patient, procedure, and site was verified.   Injection Procedure Details:   Procedure diagnoses: Lumbar radiculopathy [M54.16]    Meds Administered:  Meds ordered this encounter  Medications   methylPREDNISolone acetate (DEPO-MEDROL) injection 80 mg    Laterality: Left  Location/Site: L4  Needle:5.0 in., 22 ga.  Short bevel or Quincke spinal needle  Needle Placement: Transforaminal  Findings:    -Comments: Excellent flow of contrast along the nerve, nerve root and into the epidural space.  Procedure Details: After squaring off the end-plates to get a true AP view, the C-arm was positioned so that an oblique view of the foramen as noted above was visualized. The target area is just inferior to the "nose of the scotty dog" or sub pedicular. The soft tissues overlying this structure were infiltrated with 2-3 ml. of 1% Lidocaine without Epinephrine.  The spinal needle was inserted toward the target using a "trajectory" view along the fluoroscope beam.  Under AP and lateral visualization, the needle was advanced so it did not puncture dura and was located close the 6 O'Clock position of the pedical in AP tracterory. Biplanar projections were used to confirm position. Aspiration was confirmed to be negative for CSF and/or blood. A 1-2 ml. volume of Isovue-250 was injected and flow of contrast was noted at each level. Radiographs were obtained for documentation purposes.   After attaining the desired flow  of contrast documented above, a 0.5 to 1.0 ml test dose of 0.25% Marcaine was injected into each respective transforaminal space.  The patient was observed for 90 seconds post injection.  After no sensory deficits were reported, and normal lower extremity motor function was noted,   the above injectate was administered so that equal amounts of the injectate were placed at each foramen (level) into the transforaminal epidural space.   Additional Comments:  No complications occurred Dressing: 2 x 2 sterile gauze and Band-Aid    Post-procedure details: Patient was observed during the procedure. Post-procedure instructions were reviewed.  Patient left the clinic in stable condition.

## 2023-06-19 NOTE — Progress Notes (Signed)
Marsh Dolly - 77 y.o. male MRN [id number]  Date of birth: [birth year]  Office Visit Note: Visit Date: 06/14/2023 PCP: Charlott Rakes, MD Referred by: Charlott Rakes, MD  Subjective: Chief Complaint  Patient presents with   Lower Back - Pain   HPI:  Yiannis Hrabik is a 77 y.o. male who comes in today for planned repeat Left L4-5  Lumbar Transforaminal epidural steroid injection with fluoroscopic guidance.  The patient has failed conservative care including home exercise, medications, time and activity modification.  This injection will be diagnostic and hopefully therapeutic.  Please see requesting physician notes for further details and justification. Patient received more than 50% pain relief from prior injection.   Referring: Ellin Goodie, FNP and Dr. Marrianne Mood Dean   ROS Otherwise per HPI.  Assessment & Plan: Visit Diagnoses:    ICD-10-CM   1. Lumbar radiculopathy  M54.16 XR C-ARM NO REPORT    Epidural Steroid injection    methylPREDNISolone acetate (DEPO-MEDROL) injection 80 mg      Plan: No additional findings.   Meds & Orders:  Meds ordered this encounter  Medications   methylPREDNISolone acetate (DEPO-MEDROL) injection 80 mg    Orders Placed This Encounter  Procedures   XR C-ARM NO REPORT   Epidural Steroid injection    Follow-up: Return for visit to requesting provider as needed.   Procedures: No procedures performed  Lumbosacral Transforaminal Epidural Steroid Injection - Sub-Pedicular Approach with Fluoroscopic Guidance  Patient: Breon Enz      Date of Birth: August 21, 1946 MRN: [id number] PCP: Charlott Rakes, MD      Visit Date: 06/14/2023   Universal Protocol:    Date/Time: 06/14/2023  Consent Given By: the patient  Position: PRONE  Additional Comments: Vital signs were monitored before and after the procedure. Patient was prepped and draped in the usual sterile fashion. The correct patient, procedure, and site was  verified.   Injection Procedure Details:   Procedure diagnoses: Lumbar radiculopathy [M54.16]    Meds Administered:  Meds ordered this encounter  Medications   methylPREDNISolone acetate (DEPO-MEDROL) injection 80 mg    Laterality: Left  Location/Site: L4  Needle:5.0 in., 22 ga.  Short bevel or Quincke spinal needle  Needle Placement: Transforaminal  Findings:    -Comments: Excellent flow of contrast along the nerve, nerve root and into the epidural space.  Procedure Details: After squaring off the end-plates to get a true AP view, the C-arm was positioned so that an oblique view of the foramen as noted above was visualized. The target area is just inferior to the "nose of the scotty dog" or sub pedicular. The soft tissues overlying this structure were infiltrated with 2-3 ml. of 1% Lidocaine without Epinephrine.  The spinal needle was inserted toward the target using a "trajectory" view along the fluoroscope beam.  Under AP and lateral visualization, the needle was advanced so it did not puncture dura and was located close the 6 O'Clock position of the pedical in AP tracterory. Biplanar projections were used to confirm position. Aspiration was confirmed to be negative for CSF and/or blood. A 1-2 ml. volume of Isovue-250 was injected and flow of contrast was noted at each level. Radiographs were obtained for documentation purposes.   After attaining the desired flow of contrast documented above, a 0.5 to 1.0 ml test dose of 0.25% Marcaine was injected into each respective transforaminal space.  The patient was observed for 90 seconds post injection.  After no sensory deficits were reported, and normal lower  extremity motor function was noted,   the above injectate was administered so that equal amounts of the injectate were placed at each foramen (level) into the transforaminal epidural space.   Additional Comments:  No complications occurred Dressing: 2 x 2 sterile gauze and  Band-Aid    Post-procedure details: Patient was observed during the procedure. Post-procedure instructions were reviewed.  Patient left the clinic in stable condition.    Clinical History: MRI LUMBAR SPINE WITHOUT AND WITH CONTRAST     TECHNIQUE:  Multiplanar and multiecho pulse sequences of the lumbar spine were  obtained without and with intravenous contrast.     CONTRAST:  9mL GADAVIST GADOBUTROL 1 MMOL/ML IV SOLN     COMPARISON:  02/01/2018     FINDINGS:  Segmentation:  Standard lumbar numbering     Alignment:  Physiologic     Vertebrae:  No fracture, evidence of discitis, or bone lesion.     Conus medullaris and cauda equina: Conus extends to the T12-L1  level. Conus and cauda equina appear normal.     Paraspinal and other soft tissues: Postoperative scarring to a mild  degree in the low left lumbar spine.     Disc levels:     T12- L1: Ventral disc bulging.     L1-L2: Mild disc narrowing and bulging     L2-L3: Disc narrowing and bulging with posterior annular fissure and  tiny right paracentral protrusion. Encroachment on the right L3  nerve root at the subarticular recess which is progressed.     L3-L4: Disc narrowing and bulging. Facet spurring and ligamentum  flavum thickening. High-grade spinal stenosis with cauda equina  crowding, unchanged. Patent foramina     L4-L5: Disc narrowing and bulging with a newright eccentric  herniation which compresses the thecal sac to an advanced degree,  especially on the right where there is asymmetric facet spurring and  ligamentum flavum thickening post left laminotomy and partial  facetectomy. There is an inferiorly migrating extrusion component to  the mid L5 body level which impinges severely on the right L5 nerve  root.     L5-S1:Disc collapse and endplate degeneration with endplate ridging.  Degenerative facet spurring.     IMPRESSION:  1. Symptomatic finding is likely at L4-5 where there is a new   herniation causing high-grade spinal stenosis. An inferiorly and  rightward migrating extrusion component tracks along the right L5  nerve root which is severely compressed.  2. L3-4 chronic moderate to advanced spinal stenosis due to  degenerative disease and short pedicles.  3. L2-3 right subarticular recess narrowing crowding the right L3  nerve root.        Electronically Signed    By: Marnee Spring M.D.    On: 01/27/2021 08:09     Objective:  VS:  HT:    WT:   BMI:     BP:(!) 160/85  HR:75bpm  TEMP: ( )  RESP:  Physical Exam Vitals and nursing note reviewed.  Constitutional:      General: He is not in acute distress.    Appearance: Normal appearance. He is not ill-appearing.  HENT:     Head: Normocephalic and atraumatic.     Right Ear: External ear normal.     Left Ear: External ear normal.     Nose: No congestion.  Eyes:     Extraocular Movements: Extraocular movements intact.  Cardiovascular:     Rate and Rhythm: Normal rate.     Pulses: Normal pulses.  Pulmonary:     Effort: Pulmonary effort is normal. No respiratory distress.  Abdominal:     General: There is no distension.     Palpations: Abdomen is soft.  Musculoskeletal:        General: No tenderness or signs of injury.     Cervical back: Neck supple.     Right lower leg: No edema.     Left lower leg: No edema.     Comments: Patient has good distal strength without clonus.  Skin:    Findings: No erythema or rash.  Neurological:     General: No focal deficit present.     Mental Status: He is alert and oriented to person, place, and time.     Sensory: No sensory deficit.     Motor: No weakness or abnormal muscle tone.     Coordination: Coordination normal.  Psychiatric:        Mood and Affect: Mood normal.        Behavior: Behavior normal.      Imaging: No results found.

## 2023-07-04 DIAGNOSIS — I10 Essential (primary) hypertension: Secondary | ICD-10-CM | POA: Diagnosis not present

## 2023-07-04 DIAGNOSIS — F418 Other specified anxiety disorders: Secondary | ICD-10-CM | POA: Diagnosis not present

## 2023-07-12 ENCOUNTER — Other Ambulatory Visit: Payer: Self-pay | Admitting: Internal Medicine

## 2023-07-12 NOTE — Telephone Encounter (Signed)
Clonazepam refilled 

## 2023-07-16 DIAGNOSIS — Z23 Encounter for immunization: Secondary | ICD-10-CM | POA: Diagnosis not present

## 2023-07-16 DIAGNOSIS — R413 Other amnesia: Secondary | ICD-10-CM | POA: Diagnosis not present

## 2023-07-16 DIAGNOSIS — Z6832 Body mass index (BMI) 32.0-32.9, adult: Secondary | ICD-10-CM | POA: Diagnosis not present

## 2023-07-16 DIAGNOSIS — R4189 Other symptoms and signs involving cognitive functions and awareness: Secondary | ICD-10-CM | POA: Diagnosis not present

## 2023-07-20 DIAGNOSIS — R4189 Other symptoms and signs involving cognitive functions and awareness: Secondary | ICD-10-CM | POA: Diagnosis not present

## 2023-07-20 DIAGNOSIS — R413 Other amnesia: Secondary | ICD-10-CM | POA: Diagnosis not present

## 2023-07-20 DIAGNOSIS — R410 Disorientation, unspecified: Secondary | ICD-10-CM | POA: Diagnosis not present

## 2023-07-22 DIAGNOSIS — Z6832 Body mass index (BMI) 32.0-32.9, adult: Secondary | ICD-10-CM | POA: Diagnosis not present

## 2023-07-22 DIAGNOSIS — R4181 Age-related cognitive decline: Secondary | ICD-10-CM | POA: Diagnosis not present

## 2023-07-26 ENCOUNTER — Encounter: Payer: Self-pay | Admitting: Physical Medicine and Rehabilitation

## 2023-08-03 DIAGNOSIS — F418 Other specified anxiety disorders: Secondary | ICD-10-CM | POA: Diagnosis not present

## 2023-08-03 DIAGNOSIS — I10 Essential (primary) hypertension: Secondary | ICD-10-CM | POA: Diagnosis not present

## 2023-08-06 DIAGNOSIS — R4189 Other symptoms and signs involving cognitive functions and awareness: Secondary | ICD-10-CM | POA: Diagnosis not present

## 2023-08-06 DIAGNOSIS — Z6832 Body mass index (BMI) 32.0-32.9, adult: Secondary | ICD-10-CM | POA: Diagnosis not present

## 2023-08-06 DIAGNOSIS — I10 Essential (primary) hypertension: Secondary | ICD-10-CM | POA: Diagnosis not present

## 2023-08-12 DIAGNOSIS — M5416 Radiculopathy, lumbar region: Secondary | ICD-10-CM | POA: Diagnosis not present

## 2023-08-13 ENCOUNTER — Other Ambulatory Visit: Payer: Self-pay

## 2023-08-13 ENCOUNTER — Encounter (HOSPITAL_COMMUNITY): Payer: Self-pay | Admitting: *Deleted

## 2023-08-13 ENCOUNTER — Emergency Department (HOSPITAL_COMMUNITY)
Admission: EM | Admit: 2023-08-13 | Discharge: 2023-08-14 | Disposition: A | Discharge status: Home / Self Care | Payer: Medicare Other | Attending: Emergency Medicine | Admitting: Emergency Medicine

## 2023-08-13 DIAGNOSIS — M2548 Effusion, other site: Secondary | ICD-10-CM | POA: Diagnosis not present

## 2023-08-13 DIAGNOSIS — M5126 Other intervertebral disc displacement, lumbar region: Secondary | ICD-10-CM | POA: Diagnosis not present

## 2023-08-13 DIAGNOSIS — M5416 Radiculopathy, lumbar region: Secondary | ICD-10-CM | POA: Insufficient documentation

## 2023-08-13 DIAGNOSIS — M545 Low back pain, unspecified: Secondary | ICD-10-CM | POA: Diagnosis present

## 2023-08-13 DIAGNOSIS — M48061 Spinal stenosis, lumbar region without neurogenic claudication: Secondary | ICD-10-CM | POA: Diagnosis not present

## 2023-08-13 DIAGNOSIS — M79604 Pain in right leg: Secondary | ICD-10-CM | POA: Diagnosis not present

## 2023-08-13 NOTE — ED Notes (Signed)
Pts wife came up to sort staff, stating that "her husbands pain is getting worse, its a "15/10 pain". Triage nurse Thayer Ohm informed

## 2023-08-13 NOTE — ED Triage Notes (Signed)
The pt has pain in his hip and his rt knee  he has a known herniated disc  he has had the pain for 2 weeks  worse today

## 2023-08-14 ENCOUNTER — Emergency Department (HOSPITAL_COMMUNITY): Payer: Medicare Other

## 2023-08-14 DIAGNOSIS — M48061 Spinal stenosis, lumbar region without neurogenic claudication: Secondary | ICD-10-CM | POA: Diagnosis not present

## 2023-08-14 DIAGNOSIS — M5126 Other intervertebral disc displacement, lumbar region: Secondary | ICD-10-CM | POA: Diagnosis not present

## 2023-08-14 DIAGNOSIS — M5416 Radiculopathy, lumbar region: Secondary | ICD-10-CM | POA: Diagnosis not present

## 2023-08-14 DIAGNOSIS — M79604 Pain in right leg: Secondary | ICD-10-CM | POA: Diagnosis not present

## 2023-08-14 DIAGNOSIS — M2548 Effusion, other site: Secondary | ICD-10-CM | POA: Diagnosis not present

## 2023-08-14 LAB — CBC WITH DIFFERENTIAL/PLATELET
Abs Immature Granulocytes: 0.01 10*3/uL (ref 0.00–0.07)
Basophils Absolute: 0.1 10*3/uL (ref 0.0–0.1)
Basophils Relative: 1 %
Eosinophils Absolute: 0.1 10*3/uL (ref 0.0–0.5)
Eosinophils Relative: 1 %
HCT: 39.1 % (ref 39.0–52.0)
Hemoglobin: 12.5 g/dL — ABNORMAL LOW (ref 13.0–17.0)
Immature Granulocytes: 0 %
Lymphocytes Relative: 38 %
Lymphs Abs: 2.7 10*3/uL (ref 0.7–4.0)
MCH: 26.3 pg (ref 26.0–34.0)
MCHC: 32 g/dL (ref 30.0–36.0)
MCV: 82.3 fL (ref 80.0–100.0)
Monocytes Absolute: 0.5 10*3/uL (ref 0.1–1.0)
Monocytes Relative: 7 %
Neutro Abs: 3.8 10*3/uL (ref 1.7–7.7)
Neutrophils Relative %: 53 %
Platelets: 351 10*3/uL (ref 150–400)
RBC: 4.75 MIL/uL (ref 4.22–5.81)
RDW: 13.7 % (ref 11.5–15.5)
WBC: 7.1 10*3/uL (ref 4.0–10.5)
nRBC: 0 % (ref 0.0–0.2)

## 2023-08-14 LAB — URINALYSIS, ROUTINE W REFLEX MICROSCOPIC
Bilirubin Urine: NEGATIVE
Glucose, UA: NEGATIVE mg/dL
Hgb urine dipstick: NEGATIVE
Ketones, ur: NEGATIVE mg/dL
Leukocytes,Ua: NEGATIVE
Nitrite: NEGATIVE
Protein, ur: NEGATIVE mg/dL
Specific Gravity, Urine: 1.024 (ref 1.005–1.030)
pH: 5 (ref 5.0–8.0)

## 2023-08-14 LAB — BASIC METABOLIC PANEL
Anion gap: 11 (ref 5–15)
BUN: 21 mg/dL (ref 8–23)
CO2: 20 mmol/L — ABNORMAL LOW (ref 22–32)
Calcium: 9.1 mg/dL (ref 8.9–10.3)
Chloride: 108 mmol/L (ref 98–111)
Creatinine, Ser: 1 mg/dL (ref 0.61–1.24)
GFR, Estimated: >60 mL/min (ref >60)
Glucose, Bld: 98 mg/dL (ref 70–99)
Potassium: 4.3 mmol/L (ref 3.5–5.1)
Sodium: 139 mmol/L (ref 135–145)

## 2023-08-14 MED ORDER — HYDROCODONE-ACETAMINOPHEN 5-325 MG PO TABS
2.0000 | ORAL_TABLET | Freq: Four times a day (QID) | ORAL | 0 refills | Status: DC | PRN
Start: 2023-08-14 — End: 2023-08-19

## 2023-08-14 MED ORDER — FENTANYL CITRATE PF 50 MCG/ML IJ SOSY
50.0000 ug | PREFILLED_SYRINGE | Freq: Once | INTRAMUSCULAR | Status: AC
  Administered 2023-08-14: 50 ug via INTRAVENOUS
  Filled 2023-08-14: qty 1

## 2023-08-14 MED ORDER — HYDROCODONE-ACETAMINOPHEN 5-325 MG PO TABS
1.0000 | ORAL_TABLET | Freq: Once | ORAL | Status: AC
  Administered 2023-08-14: 1 via ORAL
  Filled 2023-08-14: qty 1

## 2023-08-14 NOTE — ED Notes (Signed)
Patient transported to MRI 

## 2023-08-14 NOTE — ED Notes (Signed)
Pt returned back to room from MRI.

## 2023-08-14 NOTE — ED Provider Notes (Signed)
[place] EMERGENCY DEPARTMENT AT [facility] Provider Note   CSN: [id number] Arrival date & time: 08/13/23  1803     History  Chief Complaint  Patient presents with   herniated disc    Caleb Woodward is a 77 y.o. male.  The history is provided by the patient and the spouse.  Caleb Woodward is a 77 y.o. male who presents to the Emergency Department complaining of back pain.  He presents to the emergency department accompanied by his wife for evaluation of acute on chronic low back pain.  He has had some mild aching low back pain since July.  He did have an MRI at that time that demonstrated a herniated disc on the right.  Over the last week he has developed acute pain in the right lower extremity from the mid thigh to the foot.  He has been seen by neurosurgery with plan for outpatient MRI.  He was started on a steroid pack earlier this week.  Over the last 24 hours his pain has severely worsened and now he cannot put weight on his leg.  No reported injuries.  No fevers, chest pain, difficulty breathing, numbness, weakness, nausea, vomiting, incontinence, dysuria.  He has been taking Tylenol and gabapentin without significant improvement in symptoms.  He sees Dr. Yetta Barre with [facility] neurosurgery.     Home Medications Prior to Admission medications   Medication Sig Start Date End Date Taking? Authorizing Provider  HYDROcodone-acetaminophen (NORCO/VICODIN) 5-325 MG tablet Take 2 tablets by mouth every 6 (six) hours as needed. 08/14/23  Yes Tilden Fossa, MD  amLODipine (NORVASC) 2.5 MG tablet Take 2.5 mg by mouth daily. 04/30/23   [provider]  amoxicillin (AMOXIL) 500 MG tablet Take 2g 1 hour prior to dental procedure Patient not taking: Reported on 04/13/2023 02/09/22   Cammy Copa, MD  b complex vitamins tablet Take 1 tablet by mouth daily.    [provider]  brexpiprazole (REXULTI) 1 MG TABS tablet Take by mouth daily.    [provider]   Cholecalciferol 125 MCG (5000 UT) capsule Take 5,000 Units by mouth daily.    [provider]  citalopram (CELEXA) 10 MG tablet Take 10 mg by mouth daily.    [provider]  clonazePAM (KLONOPIN) 1 MG tablet TAKE 2 TO 3 TABLETS BY MOUTH AT BEDTIME FOR SLEEP 07/12/23   Young, Joni Fears D, MD  diclofenac Sodium (VOLTAREN) 1 % GEL Apply 2 g topically 4 (four) times daily. Patient taking differently: Apply 2 g topically 4 (four) times daily as needed (knee pain). 02/27/21   Jones Bales, NP  ezetimibe (ZETIA) 10 MG tablet Take 10 mg by mouth daily.    [provider]  gabapentin (NEURONTIN) 300 MG capsule Take by mouth. 05/20/23   [provider]  Magnesium 400 MG TABS Take 400 mg by mouth daily.    [provider]  methocarbamol (ROBAXIN) 500 MG tablet Take 1 tablet (500 mg total) by mouth every 8 (eight) hours as needed for muscle spasms. 05/20/22   Juanda Chance, NP  Multiple Vitamin (MULTIVITAMIN WITH MINERALS) TABS tablet Take 1 tablet by mouth daily.    [provider]  rosuvastatin (CRESTOR) 5 MG tablet Take 5 mg by mouth daily.    [provider]      Allergies    Cymbalta [duloxetine hcl] and Meloxicam    Review of Systems   Review of Systems  All other systems reviewed and are  negative.   Physical Exam Updated Vital Signs BP (!) 159/87 (BP Location: Right Arm)   Pulse (!) 54   Temp 97.7 F (36.5 C) (Oral)   Resp 17   Ht 5\' 10"  (1.778 m)   Wt 100.1 kg   SpO2 95%   BMI 31.66 kg/m  Physical Exam Vitals and nursing note reviewed.  Constitutional:      Appearance: He is well-developed.  HENT:     Head: Normocephalic and atraumatic.  Cardiovascular:     Rate and Rhythm: Normal rate and regular rhythm.     Heart sounds: No murmur heard. Pulmonary:     Effort: Pulmonary effort is normal. No respiratory distress.     Breath sounds: Normal breath sounds.  Abdominal:     Palpations: Abdomen is soft.      Tenderness: There is no abdominal tenderness. There is no guarding or rebound.  Musculoskeletal:        General: No swelling or tenderness.     Comments: 2+ DP pulses bilaterally.  There is no soft tissue swelling or skin changes to bilateral lower extremities.  Skin:    General: Skin is warm and dry.  Neurological:     Mental Status: He is alert and oriented to person, place, and time.     Comments: 5 out of 5 strength in bilateral upper extremities.  There is weakness of dorsiflexion of the right foot.  There is mild drift to the right leg.  Sensation to light touch intact throughout bilateral lower extremities.  5 out of 5 plantar flexion of the right lower extremity.  Psychiatric:        Behavior: Behavior normal.     ED Results / Procedures / Treatments   Labs (all labs ordered are listed, but only abnormal results are displayed) Labs Reviewed  BASIC METABOLIC PANEL - Abnormal; Notable for the following components:      Result Value   CO2 20 (*)    All other components within normal limits  CBC WITH DIFFERENTIAL/PLATELET - Abnormal; Notable for the following components:   Hemoglobin 12.5 (*)    All other components within normal limits  URINALYSIS, ROUTINE W REFLEX MICROSCOPIC  CBC WITH DIFFERENTIAL/PLATELET    EKG None  Radiology MR LUMBAR SPINE WO CONTRAST  Result Date: 08/14/2023 CLINICAL DATA:  77 year old male with right leg pain, unable to stand. Previous laminectomy. EXAM: MRI LUMBAR SPINE WITHOUT CONTRAST TECHNIQUE: Multiplanar, multisequence MR imaging of the lumbar spine was performed. No intravenous contrast was administered. COMPARISON:  Lumbar MRI 05/11/2023 and earlier. Lumbar radiographs 10/03/2012.  CT Abdomen and Pelvis 01/05/2014. FINDINGS: Segmentation: Normal on the comparison radiographs, the same numbering system used on the July MRI. Alignment: Stable lumbar lordosis. No significant scoliosis or spondylolisthesis. Vertebrae: Normal background bone  marrow signal. Chronic degenerative endplate marrow signal changes most pronounced at L5-S1. Faint degenerative appearing anterior superior endplate marrow edema at both L4 and L5 eccentric to the right (series 6, image 7). No other acute osseous abnormality identified. Intact visible sacrum and SI joints. Conus medullaris and cauda equina: Conus extends to the T12-L1 level. No lower spinal cord or conus signal abnormality. Generally normal cauda equina nerve roots. Paraspinal and other soft tissues: Stable and negative visible abdominal viscera, paraspinal soft tissues. Disc levels: T11-T12 and T12-L1:  Negative for age. L1-L2: Stable to mildly improved mild spinal stenosis here related to mild disc bulging, epidural lipomatosis and posterior element hypertrophy. L2-L3: Right paracentral disc extrusion seen at this level  in July does not persist. Ongoing mild multifactorial spinal stenosis (series 8, image 16), but substantially improved along with right lateral recess patency (right L3 nerve level). Stable underlying disc bulging, posterior element hypertrophy. L3-L4: Chronic left laminectomy. Circumferential disc bulge with endplate spurring and up to moderate residual facet hypertrophy on the right are stable. Stable mild to moderate right lateral recess stenosis (right L4 nerve level), mild to moderate bilateral L3 neural foraminal stenosis. L4-L5: Chronic left laminectomy. Circumferential disc bulge and endplate spurring, moderate residual facet hypertrophy appears stable aside from increased degenerative facet joint fluid on the right. Stable mild residual spinal stenosis. Moderate to severe right lateral recess stenosis also stable (right L5 nerve level series 8, image 32). Moderate bilateral L4 neural foraminal stenosis appears stable and is greater on the right. L5-S1: Chronic left laminectomy. Stable circumferential disc osteophyte complex and up to moderate facet hypertrophy greater on the right. No  spinal or lateral recess stenosis. Mild to moderate L5 foraminal stenosis is stable and greater on the left. IMPRESSION: 1. Resolved right paracentral disc extrusion at L2-L3 since a July MRI. Improved spinal and right lateral recess patency there with mild residual multifactorial degenerative stenosis. 2. Otherwise stable MRI appearance of the lumbar spine, including chronic left laminectomies from L3 through S1. Right side neural impingement is maximal at the L4-L5 > L3-L4 levels, with up to Severe chronic right lateral recess stenosis at the former. Query Right L4 and/or L5 radiculitis. Electronically Signed   By: Odessa Fleming M.D.   On: 08/14/2023 05:51    Procedures Procedures    Medications Ordered in ED Medications  fentaNYL (SUBLIMAZE) injection 50 mcg (50 mcg Intravenous Given 08/14/23 0253)  HYDROcodone-acetaminophen (NORCO/VICODIN) 5-325 MG per tablet 1 tablet (1 tablet Oral Given 08/14/23 1610)    ED Course/ Medical Decision Making/ A&P                                 Medical Decision Making Amount and/or Complexity of Data Reviewed Labs: ordered. Radiology: ordered.  Risk Prescription drug management.   Patient here for evaluation of 1 week of right leg pain.  Does have a known history of lumbar disease, referred by neurosurgery.  He is already been started on steroids as an outpatient per wife.  He does have pain referred to the right lower extremity as well as mild weakness of dorsiflexion in the right foot, sensation and perfusion is intact.  Presentation is not consistent with acute infectious process, limb ischemia, CVA.  Imaging does demonstrate some possible radiculitis, otherwise stable.  Will start on pain medications for home.  Discussed with Dr. Maisie Fus with neurosurgery.  Plan to have patient discharged home with pain medications, outpatient neurosurgery follow-up.        Final Clinical Impression(s) / ED Diagnoses Final diagnoses:  Lumbar radiculopathy    Rx  / DC Orders ED Discharge Orders          Ordered    HYDROcodone-acetaminophen (NORCO/VICODIN) 5-325 MG tablet  Every 6 hours PRN        08/14/23 0713              Tilden Fossa, MD 08/14/23 [phone]

## 2023-08-14 NOTE — ED Notes (Signed)
ED Provider at bedside. 

## 2023-08-16 ENCOUNTER — Other Ambulatory Visit: Payer: Self-pay | Admitting: Neurological Surgery

## 2023-08-16 DIAGNOSIS — M5416 Radiculopathy, lumbar region: Secondary | ICD-10-CM | POA: Diagnosis not present

## 2023-08-17 ENCOUNTER — Encounter (HOSPITAL_COMMUNITY): Payer: Self-pay | Admitting: Neurological Surgery

## 2023-08-17 NOTE — Progress Notes (Signed)
SDW call  Patient's wife, Stanton Kidney was given pre-op instructions over the phone. She verbalized understanding of instructions provided.     PCP - Dr. Charlott Rakes Cardiologist - Dr. Nanetta Batty Pulmonary:    PPM/ICD - denies Device Orders - na Rep Notified - na   Chest x-ray - na EKG -  na Stress Test - ECHO - 09/18/2018 Cardiac Cath -   Sleep Study/sleep apnea/CPAP: denies  Non-diabetic   Blood Thinner Instructions: denies Aspirin Instructions:denies   ERAS Protcol - Clear fluids until noon   COVID TEST- na    Anesthesia review: No   Your procedure is scheduled on Wednesday August 18, 2023  Report to [facility] Main Entrance "A" at  1230 PM., then check in with the Admitting office.  Call this number if you have problems the morning of surgery:  [phone]   If you have any questions prior to your surgery date call [phone]: Open Monday-Friday 8am-4pm If you experience any cold or flu symptoms such as cough, fever, chills, shortness of breath, etc. between now and your scheduled surgery, please notify us at the above number    Remember:  Do not eat after midnight the night before your surgery  You may drink clear liquids until  1200 the day of your surgery.   Clear liquids allowed are: Water, Non-Citrus Juices (without pulp), Carbonated Beverages, Clear Tea, Black Coffee ONLY (NO MILK, CREAM OR POWDERED CREAMER of any kind), and Gatorade   Take these medicines the morning of surgery with A SIP OF WATER:  Amlodipine, celexa, zetia, gabapentin, medrol dose pack  As needed: Norco  As of today, STOP taking any Aspirin (unless otherwise instructed by your surgeon) Aleve, Naproxen, Ibuprofen, Motrin, Advil, Goody's, BC's, all herbal medications, fish oil, and all vitamins.

## 2023-08-18 ENCOUNTER — Ambulatory Visit (HOSPITAL_BASED_OUTPATIENT_CLINIC_OR_DEPARTMENT_OTHER): Payer: Medicare Other

## 2023-08-18 ENCOUNTER — Observation Stay (HOSPITAL_COMMUNITY)
Admission: RE | Admit: 2023-08-18 | Discharge: 2023-08-19 | Disposition: A | Discharge status: Home / Self Care | Payer: Medicare Other | Attending: Neurological Surgery | Admitting: Neurological Surgery

## 2023-08-18 ENCOUNTER — Encounter (HOSPITAL_COMMUNITY)
Admission: RE | Disposition: A | Discharge status: Home / Self Care | Payer: Self-pay | Source: Home / Self Care | Attending: Neurological Surgery

## 2023-08-18 ENCOUNTER — Ambulatory Visit (HOSPITAL_COMMUNITY): Payer: Self-pay

## 2023-08-18 ENCOUNTER — Ambulatory Visit (HOSPITAL_COMMUNITY): Payer: Medicare Other

## 2023-08-18 ENCOUNTER — Encounter (HOSPITAL_COMMUNITY): Payer: Self-pay | Admitting: Neurological Surgery

## 2023-08-18 ENCOUNTER — Other Ambulatory Visit: Payer: Self-pay

## 2023-08-18 DIAGNOSIS — Z79899 Other long term (current) drug therapy: Secondary | ICD-10-CM | POA: Insufficient documentation

## 2023-08-18 DIAGNOSIS — M48061 Spinal stenosis, lumbar region without neurogenic claudication: Secondary | ICD-10-CM | POA: Diagnosis not present

## 2023-08-18 DIAGNOSIS — I10 Essential (primary) hypertension: Secondary | ICD-10-CM | POA: Insufficient documentation

## 2023-08-18 DIAGNOSIS — M5416 Radiculopathy, lumbar region: Secondary | ICD-10-CM | POA: Insufficient documentation

## 2023-08-18 DIAGNOSIS — Z01812 Encounter for preprocedural laboratory examination: Secondary | ICD-10-CM | POA: Insufficient documentation

## 2023-08-18 DIAGNOSIS — Z96652 Presence of left artificial knee joint: Secondary | ICD-10-CM | POA: Diagnosis not present

## 2023-08-18 DIAGNOSIS — Z8546 Personal history of malignant neoplasm of prostate: Secondary | ICD-10-CM | POA: Insufficient documentation

## 2023-08-18 DIAGNOSIS — Z9889 Other specified postprocedural states: Principal | ICD-10-CM

## 2023-08-18 HISTORY — PX: LUMBAR LAMINECTOMY/DECOMPRESSION MICRODISCECTOMY: SHX5026

## 2023-08-18 HISTORY — DX: Essential (primary) hypertension: I10

## 2023-08-18 HISTORY — DX: Unspecified osteoarthritis, unspecified site: M19.90

## 2023-08-18 LAB — PROTIME-INR
INR: 1 (ref 0.8–1.2)
Prothrombin Time: 13.1 s (ref 11.4–15.2)

## 2023-08-18 LAB — SURGICAL PCR SCREEN
MRSA, PCR: NEGATIVE
Staphylococcus aureus: NEGATIVE

## 2023-08-18 SURGERY — LUMBAR LAMINECTOMY/DECOMPRESSION MICRODISCECTOMY 2 LEVELS
Anesthesia: General | Site: Back | Laterality: Right

## 2023-08-18 MED ORDER — CHLORHEXIDINE GLUCONATE 0.12 % MT SOLN
15.0000 mL | Freq: Once | OROMUCOSAL | Status: AC
  Administered 2023-08-18: 15 mL via OROMUCOSAL
  Filled 2023-08-18: qty 15

## 2023-08-18 MED ORDER — ONDANSETRON HCL 4 MG/2ML IJ SOLN
INTRAMUSCULAR | Status: DC | PRN
  Administered 2023-08-18: 4 mg via INTRAVENOUS

## 2023-08-18 MED ORDER — ONDANSETRON HCL 4 MG/2ML IJ SOLN
INTRAMUSCULAR | Status: AC
  Filled 2023-08-18: qty 2

## 2023-08-18 MED ORDER — CEFAZOLIN SODIUM-DEXTROSE 2-4 GM/100ML-% IV SOLN
2.0000 g | Freq: Three times a day (TID) | INTRAVENOUS | Status: AC
  Administered 2023-08-18 – 2023-08-19 (×2): 2 g via INTRAVENOUS
  Filled 2023-08-18 (×2): qty 100

## 2023-08-18 MED ORDER — DEXAMETHASONE SODIUM PHOSPHATE 4 MG/ML IJ SOLN: 4.0000 mg | Freq: Four times a day (QID) | INTRAMUSCULAR | Status: DC

## 2023-08-18 MED ORDER — ONDANSETRON HCL 4 MG/2ML IJ SOLN: 4.0000 mg | Freq: Four times a day (QID) | INTRAMUSCULAR | Status: DC | PRN

## 2023-08-18 MED ORDER — MORPHINE SULFATE (PF) 2 MG/ML IV SOLN: 2.0000 mg | INTRAVENOUS | Status: DC | PRN

## 2023-08-18 MED ORDER — ROCURONIUM BROMIDE 10 MG/ML (PF) SYRINGE
PREFILLED_SYRINGE | INTRAVENOUS | Status: DC | PRN
  Administered 2023-08-18: 30 mg via INTRAVENOUS
  Administered 2023-08-18: 50 mg via INTRAVENOUS

## 2023-08-18 MED ORDER — SODIUM CHLORIDE 0.9% FLUSH: 10.0000 mL | Freq: Two times a day (BID) | INTRAVENOUS | Status: DC

## 2023-08-18 MED ORDER — HYDROCODONE-ACETAMINOPHEN 5-325 MG PO TABS
2.0000 | ORAL_TABLET | ORAL | Status: DC | PRN
  Administered 2023-08-18 – 2023-08-19 (×2): 2 via ORAL
  Administered 2023-08-19: 1 via ORAL
  Filled 2023-08-18 (×3): qty 2

## 2023-08-18 MED ORDER — CLONAZEPAM 1 MG PO TBDP: 3.0000 mg | ORAL_TABLET | Freq: Every day | ORAL | Status: DC

## 2023-08-18 MED ORDER — MAGNESIUM OXIDE -MG SUPPLEMENT 400 (240 MG) MG PO TABS
400.0000 mg | ORAL_TABLET | Freq: Every day | ORAL | Status: DC
  Filled 2023-08-18: qty 1

## 2023-08-18 MED ORDER — BUPIVACAINE HCL (PF) 0.25 % IJ SOLN
INTRAMUSCULAR | Status: DC | PRN
  Administered 2023-08-18: 6 mL

## 2023-08-18 MED ORDER — MENTHOL 3 MG MT LOZG: 1.0000 | LOZENGE | OROMUCOSAL | Status: DC | PRN

## 2023-08-18 MED ORDER — SENNA 8.6 MG PO TABS
1.0000 | ORAL_TABLET | Freq: Two times a day (BID) | ORAL | Status: DC
  Administered 2023-08-18 – 2023-08-19 (×2): 8.6 mg via ORAL
  Filled 2023-08-18 (×2): qty 1

## 2023-08-18 MED ORDER — GABAPENTIN 300 MG PO CAPS
300.0000 mg | ORAL_CAPSULE | Freq: Two times a day (BID) | ORAL | Status: DC
  Administered 2023-08-18: 300 mg via ORAL
  Filled 2023-08-18 (×2): qty 1

## 2023-08-18 MED ORDER — LACTATED RINGERS IV SOLN
INTRAVENOUS | Status: DC | PRN
Start: 2023-08-18 — End: 2023-08-18

## 2023-08-18 MED ORDER — CLONAZEPAM 0.5 MG PO TABS
3.0000 mg | ORAL_TABLET | Freq: Every day | ORAL | Status: DC
  Administered 2023-08-18: 3 mg via ORAL
  Filled 2023-08-18: qty 6

## 2023-08-18 MED ORDER — THROMBIN 5000 UNITS EX SOLR: OROMUCOSAL | Status: DC | PRN

## 2023-08-18 MED ORDER — CITALOPRAM HYDROBROMIDE 20 MG PO TABS
10.0000 mg | ORAL_TABLET | Freq: Every day | ORAL | Status: DC
  Filled 2023-08-18: qty 1

## 2023-08-18 MED ORDER — MAGNESIUM 400 MG PO TABS: 400.0000 mg | ORAL_TABLET | Freq: Every day | ORAL | Status: DC

## 2023-08-18 MED ORDER — PHENOL 1.4 % MT LIQD: 1.0000 | OROMUCOSAL | Status: DC | PRN

## 2023-08-18 MED ORDER — CEFAZOLIN SODIUM-DEXTROSE 2-4 GM/100ML-% IV SOLN
2.0000 g | INTRAVENOUS | Status: AC
  Administered 2023-08-18: 2 g via INTRAVENOUS
  Filled 2023-08-18: qty 100

## 2023-08-18 MED ORDER — ACETAMINOPHEN 500 MG PO TABS
1000.0000 mg | ORAL_TABLET | Freq: Once | ORAL | Status: AC
  Administered 2023-08-18: 1000 mg via ORAL
  Filled 2023-08-18: qty 2

## 2023-08-18 MED ORDER — OXYCODONE HCL 5 MG PO TABS: 5.0000 mg | ORAL_TABLET | Freq: Once | ORAL | Status: DC | PRN

## 2023-08-18 MED ORDER — ORAL CARE MOUTH RINSE: 15.0000 mL | Freq: Once | OROMUCOSAL | Status: AC

## 2023-08-18 MED ORDER — 0.9 % SODIUM CHLORIDE (POUR BTL) OPTIME
TOPICAL | Status: DC | PRN
  Administered 2023-08-18: 1000 mL

## 2023-08-18 MED ORDER — ONDANSETRON HCL 4 MG PO TABS: 4.0000 mg | ORAL_TABLET | Freq: Four times a day (QID) | ORAL | Status: DC | PRN

## 2023-08-18 MED ORDER — FENTANYL CITRATE (PF) 100 MCG/2ML IJ SOLN
25.0000 ug | INTRAMUSCULAR | Status: DC | PRN
  Administered 2023-08-18: 25 ug via INTRAVENOUS

## 2023-08-18 MED ORDER — OXYCODONE HCL 5 MG/5ML PO SOLN: 5.0000 mg | Freq: Once | ORAL | Status: DC | PRN

## 2023-08-18 MED ORDER — SODIUM CHLORIDE 0.9% FLUSH: 3.0000 mL | Freq: Two times a day (BID) | INTRAVENOUS | Status: DC

## 2023-08-18 MED ORDER — BUPIVACAINE HCL (PF) 0.25 % IJ SOLN
INTRAMUSCULAR | Status: AC
  Filled 2023-08-18: qty 30

## 2023-08-18 MED ORDER — CHLORHEXIDINE GLUCONATE CLOTH 2 % EX PADS: 6.0000 | MEDICATED_PAD | Freq: Once | CUTANEOUS | Status: DC

## 2023-08-18 MED ORDER — METHOCARBAMOL 1000 MG/10ML IJ SOLN: 500.0000 mg | Freq: Four times a day (QID) | INTRAVENOUS | Status: DC | PRN

## 2023-08-18 MED ORDER — FENTANYL CITRATE (PF) 100 MCG/2ML IJ SOLN
INTRAMUSCULAR | Status: AC
  Filled 2023-08-18: qty 2

## 2023-08-18 MED ORDER — FENTANYL CITRATE (PF) 250 MCG/5ML IJ SOLN
INTRAMUSCULAR | Status: DC | PRN
  Administered 2023-08-18 (×2): 50 ug via INTRAVENOUS

## 2023-08-18 MED ORDER — SODIUM CHLORIDE 0.9% FLUSH: 3.0000 mL | INTRAVENOUS | Status: DC | PRN

## 2023-08-18 MED ORDER — PROPOFOL 10 MG/ML IV BOLUS
INTRAVENOUS | Status: AC
  Filled 2023-08-18: qty 20

## 2023-08-18 MED ORDER — ACETAMINOPHEN 650 MG RE SUPP: 650.0000 mg | RECTAL | Status: DC | PRN

## 2023-08-18 MED ORDER — DEXAMETHASONE 4 MG PO TABS
4.0000 mg | ORAL_TABLET | Freq: Four times a day (QID) | ORAL | Status: DC
  Administered 2023-08-18 – 2023-08-19 (×2): 4 mg via ORAL
  Filled 2023-08-18 (×2): qty 1

## 2023-08-18 MED ORDER — PROPOFOL 10 MG/ML IV BOLUS
INTRAVENOUS | Status: DC | PRN
  Administered 2023-08-18: 150 mg via INTRAVENOUS

## 2023-08-18 MED ORDER — ACETAMINOPHEN 325 MG PO TABS: 650.0000 mg | ORAL_TABLET | ORAL | Status: DC | PRN

## 2023-08-18 MED ORDER — CELECOXIB 200 MG PO CAPS
200.0000 mg | ORAL_CAPSULE | Freq: Two times a day (BID) | ORAL | Status: DC
  Administered 2023-08-18: 200 mg via ORAL
  Filled 2023-08-18 (×2): qty 1

## 2023-08-18 MED ORDER — DOCUSATE SODIUM 100 MG PO CAPS
100.0000 mg | ORAL_CAPSULE | Freq: Two times a day (BID) | ORAL | Status: DC | PRN
  Administered 2023-08-18: 100 mg via ORAL
  Filled 2023-08-18: qty 1

## 2023-08-18 MED ORDER — EPHEDRINE SULFATE-NACL 50-0.9 MG/10ML-% IV SOSY
PREFILLED_SYRINGE | INTRAVENOUS | Status: DC | PRN
  Administered 2023-08-18: 5 mg via INTRAVENOUS

## 2023-08-18 MED ORDER — DEXAMETHASONE SODIUM PHOSPHATE 10 MG/ML IJ SOLN
INTRAMUSCULAR | Status: DC | PRN
  Administered 2023-08-18: 10 mg via INTRAVENOUS

## 2023-08-18 MED ORDER — SUGAMMADEX SODIUM 200 MG/2ML IV SOLN
INTRAVENOUS | Status: DC | PRN
  Administered 2023-08-18: 200 mg via INTRAVENOUS

## 2023-08-18 MED ORDER — LIDOCAINE 2% (20 MG/ML) 5 ML SYRINGE
INTRAMUSCULAR | Status: DC | PRN
  Administered 2023-08-18: 100 mg via INTRAVENOUS

## 2023-08-18 MED ORDER — DROPERIDOL 2.5 MG/ML IJ SOLN: 0.6250 mg | Freq: Once | INTRAMUSCULAR | Status: DC | PRN

## 2023-08-18 MED ORDER — AMLODIPINE BESYLATE 5 MG PO TABS
2.5000 mg | ORAL_TABLET | Freq: Every day | ORAL | Status: DC
  Filled 2023-08-18: qty 1

## 2023-08-18 MED ORDER — FENTANYL CITRATE (PF) 250 MCG/5ML IJ SOLN
INTRAMUSCULAR | Status: AC
  Filled 2023-08-18: qty 5

## 2023-08-18 MED ORDER — EZETIMIBE 10 MG PO TABS
10.0000 mg | ORAL_TABLET | Freq: Every day | ORAL | Status: DC
  Filled 2023-08-18: qty 1

## 2023-08-18 MED ORDER — METHOCARBAMOL 500 MG PO TABS
500.0000 mg | ORAL_TABLET | Freq: Four times a day (QID) | ORAL | Status: DC | PRN
  Administered 2023-08-18 – 2023-08-19 (×2): 500 mg via ORAL
  Filled 2023-08-18 (×2): qty 1

## 2023-08-18 MED ORDER — THROMBIN 5000 UNITS EX SOLR
CUTANEOUS | Status: AC
  Filled 2023-08-18: qty 5000

## 2023-08-18 SURGICAL SUPPLY — 41 items
APL SKNCLS STERI-STRIP NONHPOA (GAUZE/BANDAGES/DRESSINGS) ×1
BAG COUNTER SPONGE SURGICOUNT (BAG) ×1 IMPLANT
BAG SPNG CNTER NS LX DISP (BAG) ×1
BENZOIN TINCTURE PRP APPL 2/3 (GAUZE/BANDAGES/DRESSINGS) ×1 IMPLANT
BUR CARBIDE MATCH 3.0 (BURR) ×1 IMPLANT
CANISTER SUCT 3000ML PPV (MISCELLANEOUS) ×1 IMPLANT
DRAPE LAPAROTOMY 100X72X124 (DRAPES) ×1 IMPLANT
DRAPE MICROSCOPE SLANT 54X150 (MISCELLANEOUS) ×1 IMPLANT
DRAPE SURG 17X23 STRL (DRAPES) ×1 IMPLANT
DRSG OPSITE POSTOP 4X6 (GAUZE/BANDAGES/DRESSINGS) IMPLANT
DURAPREP 26ML APPLICATOR (WOUND CARE) ×1 IMPLANT
ELECT REM PT RETURN 9FT ADLT (ELECTROSURGICAL) ×1
ELECTRODE REM PT RTRN 9FT ADLT (ELECTROSURGICAL) ×1 IMPLANT
GAUZE 4X4 16PLY ~~LOC~~+RFID DBL (SPONGE) IMPLANT
GLOVE BIO SURGEON STRL SZ7 (GLOVE) IMPLANT
GLOVE BIO SURGEON STRL SZ8 (GLOVE) ×1 IMPLANT
GLOVE BIOGEL PI IND STRL 7.0 (GLOVE) IMPLANT
GOWN STRL REUS W/ TWL LRG LVL3 (GOWN DISPOSABLE) IMPLANT
GOWN STRL REUS W/ TWL XL LVL3 (GOWN DISPOSABLE) ×1 IMPLANT
GOWN STRL REUS W/TWL 2XL LVL3 (GOWN DISPOSABLE) IMPLANT
GOWN STRL REUS W/TWL LRG LVL3 (GOWN DISPOSABLE)
GOWN STRL REUS W/TWL XL LVL3 (GOWN DISPOSABLE) ×1
HEMOSTAT POWDER KIT SURGIFOAM (HEMOSTASIS) ×1 IMPLANT
KIT BASIN OR (CUSTOM PROCEDURE TRAY) ×1 IMPLANT
KIT TURNOVER KIT B (KITS) ×1 IMPLANT
NDL HYPO 25X1 1.5 SAFETY (NEEDLE) ×1 IMPLANT
NDL SPNL 20GX3.5 QUINCKE YW (NEEDLE) IMPLANT
NEEDLE HYPO 25X1 1.5 SAFETY (NEEDLE) ×1 IMPLANT
NEEDLE SPNL 20GX3.5 QUINCKE YW (NEEDLE) IMPLANT
NS IRRIG 1000ML POUR BTL (IV SOLUTION) ×1 IMPLANT
PACK LAMINECTOMY NEURO (CUSTOM PROCEDURE TRAY) ×1 IMPLANT
PAD ARMBOARD 7.5X6 YLW CONV (MISCELLANEOUS) ×3 IMPLANT
SPONGE SURGIFOAM ABS GEL SZ50 (HEMOSTASIS) ×1 IMPLANT
STRIP CLOSURE SKIN 1/2X4 (GAUZE/BANDAGES/DRESSINGS) ×1 IMPLANT
SUT VIC AB 0 CT1 18XCR BRD8 (SUTURE) ×1 IMPLANT
SUT VIC AB 0 CT1 8-18 (SUTURE) ×1
SUT VIC AB 2-0 CP2 18 (SUTURE) ×1 IMPLANT
SUT VIC AB 3-0 SH 8-18 (SUTURE) ×1 IMPLANT
TOWEL GREEN STERILE (TOWEL DISPOSABLE) ×1 IMPLANT
TOWEL GREEN STERILE FF (TOWEL DISPOSABLE) ×1 IMPLANT
WATER STERILE IRR 1000ML POUR (IV SOLUTION) ×1 IMPLANT

## 2023-08-18 NOTE — Anesthesia Preprocedure Evaluation (Addendum)
Anesthesia Evaluation  Patient identified by MRN, date of birth, ID band Patient awake    Reviewed: Allergy & Precautions, NPO status , Patient's Chart, lab work & pertinent test results  History of Anesthesia Complications Negative for: history of anesthetic complications  Airway Mallampati: I  TM Distance: >3 FB Neck ROM: Full    Dental no notable dental hx.    Pulmonary sleep apnea    Pulmonary exam normal        Cardiovascular hypertension, Pt. on medications Normal cardiovascular exam     Neuro/Psych  Headaches    GI/Hepatic Neg liver ROS, hiatal hernia,GERD  Controlled,,  Endo/Other  negative endocrine ROS    Renal/GU negative Renal ROS     Musculoskeletal  (+) Arthritis ,    Abdominal   Peds  Hematology negative hematology ROS (+)   Anesthesia Other Findings Day of surgery medications reviewed with patient.  Reproductive/Obstetrics                             Anesthesia Physical Anesthesia Plan  ASA: 2  Anesthesia Plan: General   Post-op Pain Management: Tylenol PO (pre-op)*   Induction: Intravenous  PONV Risk Score and Plan: 2 and Treatment may vary due to age or medical condition, Ondansetron and Dexamethasone  Airway Management Planned: Oral ETT  Additional Equipment: None  Intra-op Plan:   Post-operative Plan: Extubation in OR  Informed Consent: I have reviewed the patients History and Physical, chart, labs and discussed the procedure including the risks, benefits and alternatives for the proposed anesthesia with the patient or authorized representative who has indicated his/her understanding and acceptance.     Dental advisory given  Plan Discussed with: CRNA  Anesthesia Plan Comments:         Anesthesia Quick Evaluation

## 2023-08-18 NOTE — Transfer of Care (Signed)
Immediate Anesthesia Transfer of Care Note  Patient: Caleb Woodward  Procedure(s) Performed: Right Lumbar Three-Four, Lumbar Four-Five Laminectomy and Foraminotomy (Right: Back)  Patient Location: PACU  Anesthesia Type:General  Level of Consciousness: drowsy and patient cooperative  Airway & Oxygen Therapy: Patient Spontanous Breathing and Patient connected to nasal cannula oxygen  Post-op Assessment: Report given to RN and Post -op Vital signs reviewed and stable  Post vital signs: Reviewed and stable  Last Vitals:  Vitals Value Taken Time  BP 159/88 08/18/23 1637  Temp    Pulse 66 08/18/23 1639  Resp 11 08/18/23 1639  SpO2 93 % 08/18/23 1639  Vitals shown include unfiled device data.  Last Pain:  Vitals:   08/18/23 1202  PainSc: 7       Patients Stated Pain Goal: 2 (08/18/23 1202)  Complications: No notable events documented.

## 2023-08-18 NOTE — Anesthesia Procedure Notes (Addendum)
Procedure Name: Intubation Date/Time: 08/18/2023 2:55 PM  Performed by: Gus Puma, CRNAPre-anesthesia Checklist: Patient identified, Emergency Drugs available, Suction available and Patient being monitored Patient Re-evaluated:Patient Re-evaluated prior to induction Oxygen Delivery Method: Circle system utilized Preoxygenation: Pre-oxygenation with 100% oxygen Induction Type: IV induction Ventilation: Oral airway inserted - appropriate to patient size Laryngoscope Size: Glidescope and 4 Grade View: Grade I Tube type: Oral Tube size: 7.5 mm Number of attempts: 1 Airway Equipment and Method: Stylet and Oral airway Placement Confirmation: ETT inserted through vocal cords under direct vision, positive ETCO2 and breath sounds checked- equal and bilateral Secured at: 23 cm Tube secured with: Tape Dental Injury: Teeth and Oropharynx as per pre-operative assessment  Comments: Intubation by Roe Coombs under observation of MD [facility]

## 2023-08-18 NOTE — Op Note (Signed)
08/18/2023  4:20 PM  PATIENT:  Caleb Woodward  77 y.o. male  PRE-OPERATIVE DIAGNOSIS: Lumbar spinal stenosis L3-4 L4-5 with right lower extremity radiculopathy  POST-OPERATIVE DIAGNOSIS:  same  PROCEDURE: Right L3-4 L4-5 hemilaminectomy, medial facetectomy, and foraminotomies  SURGEON:  Marikay Alar, MD  ASSISTANTS: Jola Baptist, FNP  ANESTHESIA:   General  EBL: 50 ml  No intake/output data recorded.  BLOOD ADMINISTERED: none  DRAINS: none  SPECIMEN:  none  INDICATION FOR PROCEDURE: This patient presented with severe unrelenting right leg pain. Imaging showed L3-4 and L4-5 lateral recess stenosis. The patient tried conservative measures without relief. Pain was debilitating. Recommended right L3-4 and L4-5 hemilaminectomy. Patient understood the risks, benefits, and alternatives and potential outcomes and wished to proceed.  PROCEDURE DETAILS: The patient was taken to the operating room and after induction of adequate generalized endotracheal anesthesia, the patient was rolled into the prone position on the Wilson frame and all pressure points were padded. The lumbar region was cleaned and then prepped with DuraPrep and draped in the usual sterile fashion. 5 cc of local anesthesia was injected and then a dorsal midline incision was made and carried down to the lumbo sacral fascia. The fascia was opened and the paraspinous musculature was taken down in a subperiosteal fashion to expose L3-4 and L4-5 on the right. Intraoperative x-ray confirmed my level, and then I used a combination of the high-speed drill and the Kerrison punches to perform a hemilaminectomy, medial facetectomy, and foraminotomy at L3 4 and L4-5 on the right. The underlying yellow ligament was opened and removed in a piecemeal fashion to expose the underlying dura and exiting nerve root. I undercut the lateral recess and dissected down until I was medial to and distal to the pedicle. The nerve root was well  decompressed. We then gently retracted the nerve root medially with a retractor, coagulated the epidural venous vasculature, and inspected the disc space.  Found no significant disc herniation.  I then palpated with a coronary dilator along the nerve root and into the foramen to assure adequate decompression. I felt no more compression of the nerve root. I irrigated with saline solution containing bacitracin. Achieved hemostasis with bipolar cautery, lined the dura with Gelfoam, and then closed the fascia with 0 Vicryl. I closed the subcutaneous tissues with 2-0 Vicryl and the subcuticular tissues with 3-0 Vicryl. The skin was then closed with benzoin and Steri-Strips. The drapes were removed, a sterile dressing was applied.  My nurse practitioner was involved in the exposure, safe retraction of the neural elements, and the closure. the patient was awakened from general anesthesia and transferred to the recovery room in stable condition. At the end of the procedure all sponge, needle and instrument counts were correct.    PLAN OF CARE: Admit for overnight observation  PATIENT DISPOSITION:  PACU - hemodynamically stable.   Delay start of Pharmacological VTE agent (>24hrs) due to surgical blood loss or risk of bleeding:  yes

## 2023-08-18 NOTE — H&P (Signed)
Subjective: Patient is a 77 y.o. male admitted for severe unrelenting right leg pain. Onset of symptoms was a few weeks ago, rapidly worsening since that time.  The pain is rated intense, and is located at the across the lower back and radiates to right leg. The pain is described as aching and sharp and occurs all day. The symptoms have been progressive. Symptoms are exacerbated by nothing in particular. MRI or CT showed right sided stenosis at L3-4 and L4-5 with neural compression  Past Medical History:  Diagnosis Date   Arthritis    Cancer (HCC) 2012   Prostate cancer   Elevated cholesterol    GERD (gastroesophageal reflux disease)    H/O hiatal hernia    Headache(784.0)    due to accident in the past   Hypertension    Neuromuscular disorder (HCC)    numbness and tingling   Sleep apnea     Past Surgical History:  Procedure Laterality Date   ANTERIOR CERVICAL DECOMP/DISCECTOMY FUSION N/A 02/06/2014   Procedure: CERVICAL FIVE TO CERVICAL SIX, CERVICAL SIX TO CERVICAL SEVEN  ANTERIOR CERVICAL DECOMPRESSION/DISCECTOMY FUSION 2 LEVELS;  Surgeon: Karn Cassis, MD;  Location: [facility];  Service: Neurosurgery;  Laterality: N/A;  C5-6 C6-7 Anterior cervical decompression/diskectomy/fusion   COLONOSCOPY W/ BIOPSIES AND POLYPECTOMY     LUMBAR LAMINECTOMY/DECOMPRESSION MICRODISCECTOMY Left 09/15/2016   Procedure: LEFT LUMBAR FOUR-FIVE, LUMBAR FIVE-SACRAL ONE MICRODISCECTOMY;  Surgeon: Hilda Lias, MD;  Location: [facility] OR;  Service: Neurosurgery;  Laterality: Left;  LEFT L4-5 L5-S1 MICRODISCECTOMY   PROSTATE SURGERY  11/27/2010   TOTAL KNEE ARTHROPLASTY Left 11/20/2021   Procedure: LEFT TOTAL KNEE ARTHROPLASTY;  Surgeon: Cammy Copa, MD;  Location: [facility] OR;  Service: Orthopedics;  Laterality: Left;   ULNAR NERVE REPAIR Left 2014    Prior to Admission medications   Medication Sig Start Date End Date Taking? Authorizing Provider  amLODipine (NORVASC) 2.5 MG tablet Take 2.5 mg by mouth  daily. 04/30/23  Yes [provider]  b complex vitamins tablet Take 1 tablet by mouth daily.   Yes [provider]  Cholecalciferol 125 MCG (5000 UT) capsule Take 5,000 Units by mouth daily.   Yes [provider]  citalopram (CELEXA) 10 MG tablet Take 10 mg by mouth daily.   Yes [provider]  clonazePAM (KLONOPIN) 1 MG tablet TAKE 2 TO 3 TABLETS BY MOUTH AT BEDTIME FOR SLEEP 07/12/23  Yes Young, Clinton D, MD  ezetimibe (ZETIA) 10 MG tablet Take 10 mg by mouth daily.   Yes [provider]  gabapentin (NEURONTIN) 300 MG capsule Take 300 mg by mouth every 12 (twelve) hours. 05/20/23  Yes [provider]  HYDROcodone-acetaminophen (NORCO/VICODIN) 5-325 MG tablet Take 2 tablets by mouth every 6 (six) hours as needed. 08/14/23  Yes Tilden Fossa, MD  Magnesium 400 MG TABS Take 400 mg by mouth daily.   Yes [provider]  MEDROL 4 MG TBPK tablet Take by mouth. 08/09/23  Yes [provider]  Multiple Vitamin (MULTIVITAMIN WITH MINERALS) TABS tablet Take 1 tablet by mouth daily.   Yes [provider]  amoxicillin (AMOXIL) 500 MG tablet Take 2g 1 hour prior to dental procedure Patient not taking: Reported on 04/13/2023 02/09/22   Cammy Copa, MD   Allergies  Allergen Reactions   Cymbalta [Duloxetine Hcl] Nausea And Vomiting   Meloxicam Nausea And Vomiting    Social History   Tobacco Use   Smoking status: Never   Smokeless tobacco: Never  Substance Use Topics   Alcohol use: Yes    Comment: socially but rarely    Family History  Problem Relation Age of Onset   Diabetes Mother      Review of Systems  Positive ROS: neg  All other systems have been reviewed and were otherwise negative with the exception of those mentioned in the HPI and as above.  Objective: Vital signs in last 24 hours: Temp:  [97.9 F (36.6 C)] 97.9 F (36.6 C) (10/16 1159) Pulse Rate:  [61] 61 (10/16 1159) Resp:  [18] 18 (10/16  1159) BP: (185)/(92) 185/92 (10/16 1159) SpO2:  [91 %] 91 % (10/16 1159) Weight:  [97.1 kg] 97.1 kg (10/16 1159)  General Appearance: Alert, cooperative, no distress, appears stated age Head: Normocephalic, without obvious abnormality, atraumatic Eyes: PERRL, conjunctiva/corneas clear, EOM's intact    Neck: Supple, symmetrical, trachea midline Back: Symmetric, no curvature, ROM normal, no CVA tenderness Lungs:  respirations unlabored Heart: Regular rate and rhythm Abdomen: Soft, non-tender Extremities: Extremities normal, atraumatic, no cyanosis or edema Pulses: 2+ and symmetric all extremities Skin: Skin color, texture, turgor normal, no rashes or lesions  NEUROLOGIC:   Mental status: Alert and oriented x4,  no aphasia, good attention span, fund of knowledge, and memory Motor Exam - grossly normal Sensory Exam - grossly normal Reflexes: 1+ Coordination - grossly normal Gait - grossly normal Balance - grossly normal Cranial Nerves: I: smell Not tested  II: visual acuity  OS: nl    OD: nl  II: visual fields Full to confrontation  II: pupils Equal, round, reactive to light  III,VII: ptosis None  III,IV,VI: extraocular muscles  Full ROM  V: mastication Normal  V: facial light touch sensation  Normal  V,VII: corneal reflex  Present  VII: facial muscle function - upper  Normal  VII: facial muscle function - lower Normal  VIII: hearing Not tested  IX: soft palate elevation  Normal  IX,X: gag reflex Present  XI: trapezius strength  5/5  XI: sternocleidomastoid strength 5/5  XI: neck flexion strength  5/5  XII: tongue strength  Normal    Data Review Lab Results  Component Value Date   WBC 7.1 08/14/2023   HGB 12.5 (L) 08/14/2023   HCT 39.1 08/14/2023   MCV 82.3 08/14/2023   PLT 351 08/14/2023   Lab Results  Component Value Date   NA 139 08/14/2023   K 4.3 08/14/2023   CL 108 08/14/2023   CO2 20 (L) 08/14/2023   BUN 21 08/14/2023   CREATININE 1.00 08/14/2023    GLUCOSE 98 08/14/2023   Lab Results  Component Value Date   INR 1.0 08/18/2023    Assessment/Plan:  Estimated body mass index is 30.71 kg/m as calculated from the following:   Height as of this encounter: 5\' 10"  (1.778 m).   Weight as of this encounter: 97.1 kg. Patient admitted for right L3-4 and L4-5 hemilaminectomy for spinal stenosis. Patient has failed a reasonable attempt at conservative therapy.  I explained the condition and procedure to the patient and answered any questions.  Patient wishes to proceed with procedure as planned. Understands risks/ benefits and typical outcomes of procedure.   Tia Alert 08/18/2023 2:16 PM

## 2023-08-19 DIAGNOSIS — Z96652 Presence of left artificial knee joint: Secondary | ICD-10-CM | POA: Diagnosis not present

## 2023-08-19 DIAGNOSIS — I10 Essential (primary) hypertension: Secondary | ICD-10-CM | POA: Diagnosis not present

## 2023-08-19 DIAGNOSIS — M5416 Radiculopathy, lumbar region: Secondary | ICD-10-CM | POA: Diagnosis not present

## 2023-08-19 DIAGNOSIS — M48061 Spinal stenosis, lumbar region without neurogenic claudication: Secondary | ICD-10-CM | POA: Diagnosis not present

## 2023-08-19 DIAGNOSIS — Z79899 Other long term (current) drug therapy: Secondary | ICD-10-CM | POA: Diagnosis not present

## 2023-08-19 DIAGNOSIS — Z8546 Personal history of malignant neoplasm of prostate: Secondary | ICD-10-CM | POA: Diagnosis not present

## 2023-08-19 MED ORDER — HYDROCODONE-ACETAMINOPHEN 5-325 MG PO TABS: 1.0000 | ORAL_TABLET | ORAL | 0 refills | Status: DC | PRN

## 2023-08-19 NOTE — Progress Notes (Signed)
Patient alert and oriented, ambulate, void, surgical clean and dry no sign of infection. D/c instructions explain and given to the patient and wife all questions answered. Patient d/c home per order.

## 2023-08-19 NOTE — Evaluation (Signed)
Occupational Therapy Evaluation Patient Details Name: Caleb Woodward MRN: [id number] DOB: [birth year] Today's Date: 08/19/2023   History of Present Illness 77 yo M s/p Right L3-4 L4-5 hemilaminectomy, medial facetectomy, and foraminotomies.  PMH includes: OA, RBD, insomnia, h/o back sx.   Clinical Impression   Patient s/p above procedure.  PTA he lives with his spouse, who can assist as needed.  Patient remained active and needed no assist with any aspect of mobility or ADL completion prior to acute onset of back pain.  Currently he is a little unsteady, but able to walk the halls and complete ADL with supervision.  No further needs in the acute setting, good understanding  of all precautions, and recommend follow up with MD as prescribed.        If plan is discharge home, recommend the following: Assist for transportation    Functional Status Assessment  Patient has not had a recent decline in their functional status  Equipment Recommendations  None recommended by OT    Recommendations for Other Services       Precautions / Restrictions Precautions Precautions: Back Precaution Booklet Issued: Yes (comment) Restrictions Weight Bearing Restrictions: No      Mobility Bed Mobility Overal bed mobility: Modified Independent                  Transfers Overall transfer level: Needs assistance   Transfers: Sit to/from Stand, Bed to chair/wheelchair/BSC Sit to Stand: Supervision     Step pivot transfers: Supervision            Balance Overall balance assessment: Mild deficits observed, not formally tested                                         ADL either performed or assessed with clinical judgement   ADL Overall ADL's : At baseline                                             Vision Patient Visual Report: No change from baseline       Perception Perception: Not tested       Praxis Praxis: Not tested        Pertinent Vitals/Pain Pain Assessment Pain Assessment: Faces Faces Pain Scale: Hurts a little bit Pain Location: Back Pain Descriptors / Indicators: Sore Pain Intervention(s): Monitored during session     Extremity/Trunk Assessment Upper Extremity Assessment Upper Extremity Assessment: Overall WFL for tasks assessed   Lower Extremity Assessment Lower Extremity Assessment: Overall WFL for tasks assessed   Cervical / Trunk Assessment Cervical / Trunk Assessment: Back Surgery   Communication Communication Communication: No apparent difficulties   Cognition Arousal: Alert Behavior During Therapy: WFL for tasks assessed/performed Overall Cognitive Status: Within Functional Limits for tasks assessed                                       General Comments   VSS on RA    Exercises     Shoulder Instructions      Home Living Family/patient expects to be discharged to:: Private residence Living Arrangements: Spouse/significant other Available Help at Discharge: Family;Available 24 hours/day Type of Home: House Home Access: Level entry  Home Layout: Two level;Able to live on main level with bedroom/bathroom     Bathroom Shower/Tub: Tub/shower unit;Walk-in shower   Bathroom Toilet: Standard Bathroom Accessibility: Yes How Accessible: Accessible via Overacker Home Equipment: Rolling Imes (2 wheels);Cane - single point;Shower seat          Prior Functioning/Environment Prior Level of Function : Independent/Modified Independent;Driving                        OT Problem List: Pain      OT Treatment/Interventions:      OT Goals(Current goals can be found in the care plan section) Acute Rehab OT Goals Patient Stated Goal: Return home OT Goal Formulation: With patient Time For Goal Achievement: 08/20/23 Potential to Achieve Goals: Good  OT Frequency:      Co-evaluation              AM-PAC OT "6 Clicks" Daily Activity      Outcome Measure Help from another person eating meals?: None Help from another person taking care of personal grooming?: None Help from another person toileting, which includes using toliet, bedpan, or urinal?: A Little Help from another person bathing (including washing, rinsing, drying)?: A Little Help from another person to put on and taking off regular upper body clothing?: None Help from another person to put on and taking off regular lower body clothing?: A Little 6 Click Score: 21   End of Session Equipment Utilized During Treatment: Gait belt Nurse Communication: Mobility status  Activity Tolerance: Patient tolerated treatment well Patient left: in chair  OT Visit Diagnosis: Unsteadiness on feet (R26.81)                Time: 0865-7846 OT Time Calculation (min): 21 min Charges:  OT General Charges $OT Visit: 1 Visit OT Evaluation $OT Eval Moderate Complexity: 1 Mod  08/19/2023  RP, OTR/L  Acute Rehabilitation Services  Office:  [phone]   Suzanna Obey 08/19/2023, 9:56 AM

## 2023-08-19 NOTE — Plan of Care (Signed)
°  Problem: Education: °Goal: Ability to verbalize activity precautions or restrictions will improve °Outcome: Completed/Met °Goal: Knowledge of the prescribed therapeutic regimen will improve °Outcome: Completed/Met °Goal: Understanding of discharge needs will improve °Outcome: Completed/Met °  °Problem: Activity: °Goal: Ability to avoid complications of mobility impairment will improve °Outcome: Completed/Met °Goal: Ability to tolerate increased activity will improve °Outcome: Completed/Met °Goal: Will remain free from falls °Outcome: Completed/Met °  °Problem: Bowel/Gastric: °Goal: Gastrointestinal status for postoperative course will improve °Outcome: Completed/Met °  °Problem: Clinical Measurements: °Goal: Ability to maintain clinical measurements within normal limits will improve °Outcome: Completed/Met °Goal: Postoperative complications will be avoided or minimized °Outcome: Completed/Met °Goal: Diagnostic test results will improve °Outcome: Completed/Met °  °Problem: Pain Management: °Goal: Pain level will decrease °Outcome: Completed/Met °  °Problem: Skin Integrity: °Goal: Will show signs of wound healing °Outcome: Completed/Met °  °Problem: Health Behavior/Discharge Planning: °Goal: Identification of resources available to assist in meeting health care needs will improve °Outcome: Completed/Met °  °Problem: Bladder/Genitourinary: °Goal: Urinary functional status for postoperative course will improve °Outcome: Completed/Met °  °

## 2023-08-19 NOTE — Anesthesia Postprocedure Evaluation (Signed)
Anesthesia Post Note  Patient: Caleb Woodward  Procedure(s) Performed: Right Lumbar Three-Four, Lumbar Four-Five Laminectomy and Foraminotomy (Right: Back)     Patient location during evaluation: PACU Anesthesia Type: General Level of consciousness: awake and alert Pain management: pain level controlled Vital Signs Assessment: post-procedure vital signs reviewed and stable Respiratory status: spontaneous breathing, nonlabored ventilation and respiratory function stable Cardiovascular status: blood pressure returned to baseline Postop Assessment: no apparent nausea or vomiting Anesthetic complications: no   No notable events documented.      Shanda Howells

## 2023-08-19 NOTE — Discharge Summary (Signed)
Physician Discharge Summary  Patient ID: Caleb Woodward MRN: [id number] DOB/AGE: 1946-06-08 77 y.o.  Admit date: 08/18/2023 Discharge date: 08/19/2023  Admission Diagnoses: Lumbar spinal stenosis with right radiculopathy   Discharge Diagnoses: Same   Discharged Condition: good  Hospital Course: The patient was admitted on 08/18/2023 and taken to the operating room where the patient underwent right L3-4 L4-5 hemilaminectomy. The patient tolerated the procedure well and was taken to the recovery room and then to the floor in stable condition. The hospital course was routine. There were no complications. The wound remained clean dry and intact. Pt had appropriate back soreness. No complaints of leg pain or new N/T/W. The patient remained afebrile with stable vital signs, and tolerated a regular diet. The patient continued to increase activities, and pain was well controlled with oral pain medications.   Consults: None  Significant Diagnostic Studies:  Results for orders placed or performed during the hospital encounter of 08/18/23  Surgical pcr screen   Specimen: Nasal Mucosa; Nasal Swab  Result Value Ref Range   MRSA, PCR NEGATIVE NEGATIVE   Staphylococcus aureus NEGATIVE NEGATIVE  Protime-INR  Result Value Ref Range   Prothrombin Time 13.1 11.4 - 15.2 seconds   INR 1.0 0.8 - 1.2    DG Lumbar Spine 2-3 Views  Result Date: 08/18/2023 CLINICAL DATA:  Elective surgery. EXAM: LUMBAR SPINE - 2-3 VIEW COMPARISON:  None Available. FINDINGS: Portable cross-table lateral view lumbar spine obtained in the operating room. Surgical instruments localize posteriorly at the L3 level. IMPRESSION: Intraoperative localization during lumbar surgery. Electronically Signed   By: Narda Rutherford M.D.   On: 08/18/2023 18:02   MR LUMBAR SPINE WO CONTRAST  Result Date: 08/14/2023 CLINICAL DATA:  77 year old male with right leg pain, unable to stand. Previous laminectomy. EXAM: MRI LUMBAR SPINE WITHOUT  CONTRAST TECHNIQUE: Multiplanar, multisequence MR imaging of the lumbar spine was performed. No intravenous contrast was administered. COMPARISON:  Lumbar MRI 05/11/2023 and earlier. Lumbar radiographs 10/03/2012.  CT Abdomen and Pelvis 01/05/2014. FINDINGS: Segmentation: Normal on the comparison radiographs, the same numbering system used on the July MRI. Alignment: Stable lumbar lordosis. No significant scoliosis or spondylolisthesis. Vertebrae: Normal background bone marrow signal. Chronic degenerative endplate marrow signal changes most pronounced at L5-S1. Faint degenerative appearing anterior superior endplate marrow edema at both L4 and L5 eccentric to the right (series 6, image 7). No other acute osseous abnormality identified. Intact visible sacrum and SI joints. Conus medullaris and cauda equina: Conus extends to the T12-L1 level. No lower spinal cord or conus signal abnormality. Generally normal cauda equina nerve roots. Paraspinal and other soft tissues: Stable and negative visible abdominal viscera, paraspinal soft tissues. Disc levels: T11-T12 and T12-L1:  Negative for age. L1-L2: Stable to mildly improved mild spinal stenosis here related to mild disc bulging, epidural lipomatosis and posterior element hypertrophy. L2-L3: Right paracentral disc extrusion seen at this level in July does not persist. Ongoing mild multifactorial spinal stenosis (series 8, image 16), but substantially improved along with right lateral recess patency (right L3 nerve level). Stable underlying disc bulging, posterior element hypertrophy. L3-L4: Chronic left laminectomy. Circumferential disc bulge with endplate spurring and up to moderate residual facet hypertrophy on the right are stable. Stable mild to moderate right lateral recess stenosis (right L4 nerve level), mild to moderate bilateral L3 neural foraminal stenosis. L4-L5: Chronic left laminectomy. Circumferential disc bulge and endplate spurring, moderate residual  facet hypertrophy appears stable aside from increased degenerative facet joint fluid on the right. Stable mild  residual spinal stenosis. Moderate to severe right lateral recess stenosis also stable (right L5 nerve level series 8, image 32). Moderate bilateral L4 neural foraminal stenosis appears stable and is greater on the right. L5-S1: Chronic left laminectomy. Stable circumferential disc osteophyte complex and up to moderate facet hypertrophy greater on the right. No spinal or lateral recess stenosis. Mild to moderate L5 foraminal stenosis is stable and greater on the left. IMPRESSION: 1. Resolved right paracentral disc extrusion at L2-L3 since a July MRI. Improved spinal and right lateral recess patency there with mild residual multifactorial degenerative stenosis. 2. Otherwise stable MRI appearance of the lumbar spine, including chronic left laminectomies from L3 through S1. Right side neural impingement is maximal at the L4-L5 > L3-L4 levels, with up to Severe chronic right lateral recess stenosis at the former. Query Right L4 and/or L5 radiculitis. Electronically Signed   By: Odessa Fleming M.D.   On: 08/14/2023 05:51    Antibiotics:  Anti-infectives (From admission, onward)    Start     Dose/Rate Route Frequency Ordered Stop   08/18/23 2300  ceFAZolin (ANCEF) IVPB 2g/100 mL premix        2 g 200 mL/hr over 30 Minutes Intravenous Every 8 hours 08/18/23 1819 08/19/23 0550   08/18/23 1200  ceFAZolin (ANCEF) IVPB 2g/100 mL premix        2 g 200 mL/hr over 30 Minutes Intravenous On call to O.R. 08/18/23 1145 08/18/23 1509       Discharge Exam: Blood pressure (!) 140/70, pulse (!) 58, temperature 97.8 F (36.6 C), temperature source Oral, resp. rate 18, height 5\' 10"  (1.778 m), weight 97.1 kg, SpO2 97%. Neurologic: Grossly normal Dressing clean dry and intact  Discharge Medications:   Allergies as of 08/19/2023       Reactions   Cymbalta [duloxetine Hcl] Nausea And Vomiting   Meloxicam Nausea  And Vomiting        Medication List     STOP taking these medications    Medrol 4 MG Tbpk tablet Generic drug: methylPREDNISolone       TAKE these medications    amLODipine 2.5 MG tablet Commonly known as: NORVASC Take 2.5 mg by mouth daily.   amoxicillin 500 MG tablet Commonly known as: AMOXIL Take 2g 1 hour prior to dental procedure   b complex vitamins tablet Take 1 tablet by mouth daily.   Cholecalciferol 125 MCG (5000 UT) capsule Take 5,000 Units by mouth daily.   citalopram 10 MG tablet Commonly known as: CELEXA Take 10 mg by mouth daily.   clonazePAM 1 MG tablet Commonly known as: KLONOPIN TAKE 2 TO 3 TABLETS BY MOUTH AT BEDTIME FOR SLEEP   ezetimibe 10 MG tablet Commonly known as: ZETIA Take 10 mg by mouth daily.   gabapentin 300 MG capsule Commonly known as: NEURONTIN Take 300 mg by mouth every 12 (twelve) hours.   HYDROcodone-acetaminophen 5-325 MG tablet Commonly known as: NORCO/VICODIN Take 1 tablet by mouth every 4 (four) hours as needed. What changed:  how much to take when to take this   Magnesium 400 MG Tabs Take 400 mg by mouth daily.   multivitamin with minerals Tabs tablet Take 1 tablet by mouth daily.        Disposition: Home   Final Dx: Right L3-4 L4-5 hemilaminectomy  Discharge Instructions      Remove dressing in 72 hours   Complete by: As directed    Call MD for:  difficulty breathing, headache or visual disturbances  Complete by: As directed    Call MD for:  persistant nausea and vomiting   Complete by: As directed    Call MD for:  redness, tenderness, or signs of infection (pain, swelling, redness, odor or green/yellow discharge around incision site)   Complete by: As directed    Call MD for:  severe uncontrolled pain   Complete by: As directed    Call MD for:  temperature >100.4   Complete by: As directed    Diet - low sodium heart healthy   Complete by: As directed    Discharge instructions   Complete  by: As directed    No heavy lifting, no strenuous activity, no repetitive bending at the waist, may shower normally, no driving for 1 week   Increase activity slowly   Complete by: As directed         Follow-up Information     Arman Bogus, MD. Schedule an appointment as soon as possible for a visit in 2 week(s).   Specialty: Neurosurgery Contact information: [address] [place] [address] [phone]                  Signed: Tia Alert 08/19/2023, 8:05 AM

## 2023-08-20 ENCOUNTER — Encounter (HOSPITAL_COMMUNITY): Payer: Self-pay | Admitting: Neurological Surgery

## 2023-08-21 NOTE — Plan of Care (Signed)
[facility] Tonsillectomy/Adenoidectomy, Postoperative PEDS care plan entered in error.

## 2023-09-03 DIAGNOSIS — F418 Other specified anxiety disorders: Secondary | ICD-10-CM | POA: Diagnosis not present

## 2023-09-03 DIAGNOSIS — I10 Essential (primary) hypertension: Secondary | ICD-10-CM | POA: Diagnosis not present

## 2023-10-03 DIAGNOSIS — I10 Essential (primary) hypertension: Secondary | ICD-10-CM | POA: Diagnosis not present

## 2023-10-03 DIAGNOSIS — F418 Other specified anxiety disorders: Secondary | ICD-10-CM | POA: Diagnosis not present

## 2023-10-08 DIAGNOSIS — E785 Hyperlipidemia, unspecified: Secondary | ICD-10-CM | POA: Diagnosis not present

## 2023-10-08 DIAGNOSIS — I10 Essential (primary) hypertension: Secondary | ICD-10-CM | POA: Diagnosis not present

## 2023-10-08 DIAGNOSIS — R7303 Prediabetes: Secondary | ICD-10-CM | POA: Diagnosis not present

## 2023-10-20 ENCOUNTER — Ambulatory Visit: Payer: Medicare Other | Admitting: Orthopedic Surgery

## 2023-11-01 DIAGNOSIS — Z1331 Encounter for screening for depression: Secondary | ICD-10-CM | POA: Diagnosis not present

## 2023-11-01 DIAGNOSIS — R4182 Altered mental status, unspecified: Secondary | ICD-10-CM | POA: Diagnosis not present

## 2023-11-01 DIAGNOSIS — F418 Other specified anxiety disorders: Secondary | ICD-10-CM | POA: Diagnosis not present

## 2023-11-01 DIAGNOSIS — I10 Essential (primary) hypertension: Secondary | ICD-10-CM | POA: Diagnosis not present

## 2023-11-01 DIAGNOSIS — Z6832 Body mass index (BMI) 32.0-32.9, adult: Secondary | ICD-10-CM | POA: Diagnosis not present

## 2023-11-01 DIAGNOSIS — R7303 Prediabetes: Secondary | ICD-10-CM | POA: Diagnosis not present

## 2023-11-02 DIAGNOSIS — R4182 Altered mental status, unspecified: Secondary | ICD-10-CM | POA: Diagnosis not present

## 2023-11-03 DIAGNOSIS — I10 Essential (primary) hypertension: Secondary | ICD-10-CM | POA: Diagnosis not present

## 2023-11-03 DIAGNOSIS — F418 Other specified anxiety disorders: Secondary | ICD-10-CM | POA: Diagnosis not present

## 2023-11-24 ENCOUNTER — Ambulatory Visit: Payer: Medicare Other | Admitting: Orthopedic Surgery

## 2023-11-25 DIAGNOSIS — H1789 Other corneal scars and opacities: Secondary | ICD-10-CM | POA: Diagnosis not present

## 2023-11-25 DIAGNOSIS — H2513 Age-related nuclear cataract, bilateral: Secondary | ICD-10-CM | POA: Diagnosis not present

## 2023-11-29 DIAGNOSIS — F418 Other specified anxiety disorders: Secondary | ICD-10-CM | POA: Diagnosis not present

## 2023-11-29 DIAGNOSIS — Z6832 Body mass index (BMI) 32.0-32.9, adult: Secondary | ICD-10-CM | POA: Diagnosis not present

## 2023-12-12 ENCOUNTER — Other Ambulatory Visit: Payer: Self-pay | Admitting: Internal Medicine

## 2023-12-13 NOTE — Telephone Encounter (Signed)
 Clonazepam refilled

## 2023-12-13 NOTE — Telephone Encounter (Signed)
 Routing to Dr Linder Revere to advise refill.

## 2024-01-01 DIAGNOSIS — I10 Essential (primary) hypertension: Secondary | ICD-10-CM | POA: Diagnosis not present

## 2024-01-01 DIAGNOSIS — F418 Other specified anxiety disorders: Secondary | ICD-10-CM | POA: Diagnosis not present

## 2024-02-01 DIAGNOSIS — F418 Other specified anxiety disorders: Secondary | ICD-10-CM | POA: Diagnosis not present

## 2024-02-01 DIAGNOSIS — E785 Hyperlipidemia, unspecified: Secondary | ICD-10-CM | POA: Diagnosis not present

## 2024-02-16 DIAGNOSIS — H6591 Unspecified nonsuppurative otitis media, right ear: Secondary | ICD-10-CM | POA: Diagnosis not present

## 2024-02-16 DIAGNOSIS — Z6832 Body mass index (BMI) 32.0-32.9, adult: Secondary | ICD-10-CM | POA: Diagnosis not present

## 2024-03-06 DIAGNOSIS — F329 Major depressive disorder, single episode, unspecified: Secondary | ICD-10-CM | POA: Diagnosis not present

## 2024-03-06 DIAGNOSIS — Z9181 History of falling: Secondary | ICD-10-CM | POA: Diagnosis not present

## 2024-03-06 DIAGNOSIS — Z6832 Body mass index (BMI) 32.0-32.9, adult: Secondary | ICD-10-CM | POA: Diagnosis not present

## 2024-04-02 DIAGNOSIS — F418 Other specified anxiety disorders: Secondary | ICD-10-CM | POA: Diagnosis not present

## 2024-04-02 DIAGNOSIS — E785 Hyperlipidemia, unspecified: Secondary | ICD-10-CM | POA: Diagnosis not present

## 2024-04-07 DIAGNOSIS — R7303 Prediabetes: Secondary | ICD-10-CM | POA: Diagnosis not present

## 2024-04-07 DIAGNOSIS — I10 Essential (primary) hypertension: Secondary | ICD-10-CM | POA: Diagnosis not present

## 2024-04-07 DIAGNOSIS — E785 Hyperlipidemia, unspecified: Secondary | ICD-10-CM | POA: Diagnosis not present

## 2024-04-10 DIAGNOSIS — M26609 Unspecified temporomandibular joint disorder, unspecified side: Secondary | ICD-10-CM | POA: Diagnosis not present

## 2024-04-10 DIAGNOSIS — H9201 Otalgia, right ear: Secondary | ICD-10-CM | POA: Diagnosis not present

## 2024-04-10 DIAGNOSIS — H9193 Unspecified hearing loss, bilateral: Secondary | ICD-10-CM | POA: Diagnosis not present
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
## 2024-04-13 ENCOUNTER — Ambulatory Visit (INDEPENDENT_AMBULATORY_CARE_PROVIDER_SITE_OTHER): Payer: Medicare Other | Admitting: Internal Medicine

## 2024-04-13 ENCOUNTER — Encounter: Payer: Self-pay | Admitting: Internal Medicine

## 2024-04-13 VITALS — BP 132/74 | HR 69 | Ht 70.0 in | Wt 219.0 lb

## 2024-04-13 DIAGNOSIS — F5101 Primary insomnia: Secondary | ICD-10-CM

## 2024-04-13 DIAGNOSIS — G4700 Insomnia, unspecified: Secondary | ICD-10-CM

## 2024-04-13 NOTE — Patient Instructions (Signed)
 Glad you are sleeping well. We can continue clonazepam  for sleep. Please let us  know when you need refills.

## 2024-04-14 DIAGNOSIS — Z6831 Body mass index (BMI) 31.0-31.9, adult: Secondary | ICD-10-CM | POA: Diagnosis not present

## 2024-04-14 DIAGNOSIS — Z9181 History of falling: Secondary | ICD-10-CM | POA: Diagnosis not present

## 2024-04-14 DIAGNOSIS — I10 Essential (primary) hypertension: Secondary | ICD-10-CM | POA: Diagnosis not present

## 2024-04-14 DIAGNOSIS — R7303 Prediabetes: Secondary | ICD-10-CM | POA: Diagnosis not present

## 2024-04-14 DIAGNOSIS — E785 Hyperlipidemia, unspecified: Secondary | ICD-10-CM | POA: Diagnosis not present

## 2024-04-14 DIAGNOSIS — R296 Repeated falls: Secondary | ICD-10-CM | POA: Diagnosis not present

## 2024-04-15 ENCOUNTER — Other Ambulatory Visit: Payer: Self-pay | Admitting: Internal Medicine

## 2024-04-17 NOTE — Telephone Encounter (Signed)
 Clonazepam refilled

## 2024-04-17 NOTE — Telephone Encounter (Signed)
**Note De-identified  [facility] Obfuscation** Please advise 

## 2024-04-21 DIAGNOSIS — H903 Sensorineural hearing loss, bilateral: Secondary | ICD-10-CM | POA: Diagnosis not present
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
## 2024-07-11 ENCOUNTER — Encounter: Payer: Self-pay | Admitting: Internal Medicine

## 2024-07-11 ENCOUNTER — Ambulatory Visit (INDEPENDENT_AMBULATORY_CARE_PROVIDER_SITE_OTHER): Admitting: Internal Medicine

## 2024-07-11 VITALS — BP 134/76 | HR 85 | Temp 98.6°F | Ht 70.5 in | Wt 223.8 lb

## 2024-07-11 DIAGNOSIS — G4700 Insomnia, unspecified: Secondary | ICD-10-CM

## 2024-07-11 DIAGNOSIS — F5101 Primary insomnia: Secondary | ICD-10-CM

## 2024-07-11 NOTE — Patient Instructions (Signed)
 Try not to be in bed longer than you plan to sleep. Try to contract your time in bed to more like 7-8 hours. Right now it seems you may bee trying to make your brain sleep more than it needs.

## 2024-07-12 ENCOUNTER — Encounter: Payer: Self-pay | Admitting: Internal Medicine

## 2024-07-27 DIAGNOSIS — Z6831 Body mass index (BMI) 31.0-31.9, adult: Secondary | ICD-10-CM | POA: Diagnosis not present

## 2024-07-27 DIAGNOSIS — M5416 Radiculopathy, lumbar region: Secondary | ICD-10-CM | POA: Diagnosis not present

## 2024-08-02 DIAGNOSIS — E785 Hyperlipidemia, unspecified: Secondary | ICD-10-CM | POA: Diagnosis not present

## 2024-08-02 DIAGNOSIS — F418 Other specified anxiety disorders: Secondary | ICD-10-CM | POA: Diagnosis not present

## 2024-08-08 DIAGNOSIS — M5416 Radiculopathy, lumbar region: Secondary | ICD-10-CM | POA: Diagnosis not present

## 2024-08-09 DIAGNOSIS — Z20822 Contact with and (suspected) exposure to covid-19: Secondary | ICD-10-CM | POA: Diagnosis not present

## 2024-08-09 DIAGNOSIS — J069 Acute upper respiratory infection, unspecified: Secondary | ICD-10-CM | POA: Diagnosis not present

## 2024-08-16 DIAGNOSIS — M5416 Radiculopathy, lumbar region: Secondary | ICD-10-CM | POA: Diagnosis not present

## 2024-08-24 DIAGNOSIS — M5416 Radiculopathy, lumbar region: Secondary | ICD-10-CM | POA: Diagnosis not present

## 2024-09-04 ENCOUNTER — Encounter: Payer: Self-pay | Admitting: Radiology

## 2024-09-21 DIAGNOSIS — Z6830 Body mass index (BMI) 30.0-30.9, adult: Secondary | ICD-10-CM | POA: Diagnosis not present

## 2024-09-21 DIAGNOSIS — M5416 Radiculopathy, lumbar region: Secondary | ICD-10-CM | POA: Diagnosis not present

## 2024-10-03 ENCOUNTER — Ambulatory Visit

## 2024-10-03 VITALS — BP 128/70 | HR 63 | Ht 70.5 in | Wt 216.0 lb

## 2024-10-03 DIAGNOSIS — G4733 Obstructive sleep apnea (adult) (pediatric): Secondary | ICD-10-CM

## 2024-10-03 DIAGNOSIS — G4709 Other insomnia: Secondary | ICD-10-CM

## 2024-10-03 DIAGNOSIS — G4752 REM sleep behavior disorder: Secondary | ICD-10-CM | POA: Diagnosis not present

## 2024-10-03 NOTE — Assessment & Plan Note (Signed)
 SABRA
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
## 2024-10-03 NOTE — Patient Instructions (Addendum)
 Contact one of these offices to discuss your sleep apnea treatment       [email or web address]       [email or web address]    VISIT SUMMARY:  Today, you came in for a follow-up appointment to discuss your insomnia and sleep apnea. You were accompanied by your wife, [name]. We reviewed your difficulties with using the CPAP machine, your history of REM behavior disorder, and your current sleep patterns.  YOUR PLAN:  -INSOMNIA: Insomnia is a condition where you have trouble falling or staying asleep. To help manage this, you should establish a strict wake-up time of 5 AM and avoid taking naps during the day. go to bed at 11 pm and then increase sleep zone once continuous sleep is established. No naps.  We also discussed the possibility of Cognitive Behavioral Therapy for Insomnia (CBTI) with a therapist and considering a referral to a psychologist. In the future, we may transition you from clonazepam  to melatonin.  -OBSTRUCTIVE SLEEP APNEA: Obstructive sleep apnea is a condition where your breathing stops and starts during sleep due to blocked airways. Since you are having trouble with the CPAP machine, we will refer you to a specialized dentist to evaluate a dental device. You might also consider using a body pillow to help you sleep on your side. If these options do not work, we can discuss the potential for Inspire surgery.  -REM SLEEP BEHAVIOR DISORDER: REM sleep behavior disorder is a condition where you act out your dreams during sleep, which can include violent movements. You will continue taking clonazepam  to manage this condition, and we will monitor your symptoms and adjust the treatment as needed.  INSTRUCTIONS:  Please follow up with the specialized dentist for the dental device evaluation. Establish a strict wake-up time of 5 AM and avoid daytime naps. Consider discussing Cognitive Behavioral Therapy for Insomnia (CBTI) with a therapist. If you have  any new symptoms or concerns, please contact our office.                      Contains text generated by Abridge.                                 Contains text generated by Abridge.

## 2024-10-04 DIAGNOSIS — R7303 Prediabetes: Secondary | ICD-10-CM | POA: Diagnosis not present

## 2024-10-04 DIAGNOSIS — E785 Hyperlipidemia, unspecified: Secondary | ICD-10-CM | POA: Diagnosis not present

## 2024-10-04 DIAGNOSIS — I10 Essential (primary) hypertension: Secondary | ICD-10-CM | POA: Diagnosis not present

## 2024-10-11 ENCOUNTER — Other Ambulatory Visit: Payer: Self-pay | Admitting: Internal Medicine

## 2024-10-11 NOTE — Telephone Encounter (Signed)
 Clonazepam refilled

## 2024-10-11 NOTE — Telephone Encounter (Signed)
 Pt requesting refill of controlled medication - please advise, thank you!  LOV: 10/03/24

## 2024-10-13 DIAGNOSIS — R7303 Prediabetes: Secondary | ICD-10-CM | POA: Diagnosis not present

## 2024-10-13 DIAGNOSIS — Z6830 Body mass index (BMI) 30.0-30.9, adult: Secondary | ICD-10-CM | POA: Diagnosis not present

## 2024-10-13 DIAGNOSIS — I10 Essential (primary) hypertension: Secondary | ICD-10-CM | POA: Diagnosis not present

## 2024-10-13 DIAGNOSIS — E785 Hyperlipidemia, unspecified: Secondary | ICD-10-CM | POA: Diagnosis not present
